# Patient Record
Sex: Female | Born: 1950 | Race: White | Hispanic: No | Smoking: Former smoker
Health system: Southern US, Community
[De-identification: ages and names within clinical notes are randomized; demographics above are authoritative.]

## PROBLEM LIST (undated history)

## (undated) DIAGNOSIS — I10 Essential (primary) hypertension: Secondary | ICD-10-CM

## (undated) DIAGNOSIS — E119 Type 2 diabetes mellitus without complications: Secondary | ICD-10-CM

## (undated) DIAGNOSIS — F32A Depression, unspecified: Secondary | ICD-10-CM

## (undated) DIAGNOSIS — R296 Repeated falls: Secondary | ICD-10-CM

## (undated) DIAGNOSIS — F329 Major depressive disorder, single episode, unspecified: Secondary | ICD-10-CM

## (undated) HISTORY — PX: ABDOMINAL HYSTERECTOMY: SHX81

## (undated) HISTORY — DX: Repeated falls: R29.6

---

## 2001-05-10 ENCOUNTER — Other Ambulatory Visit: Admission: RE | Admit: 2001-05-10 | Discharge: 2001-05-10 | Payer: Self-pay | Admitting: Gynecology

## 2001-05-11 ENCOUNTER — Ambulatory Visit (HOSPITAL_COMMUNITY): Admission: RE | Admit: 2001-05-11 | Discharge: 2001-05-11 | Payer: Self-pay | Admitting: Gastroenterology

## 2001-05-11 ENCOUNTER — Encounter (INDEPENDENT_AMBULATORY_CARE_PROVIDER_SITE_OTHER): Payer: Self-pay

## 2004-01-24 ENCOUNTER — Emergency Department (HOSPITAL_COMMUNITY): Admission: EM | Admit: 2004-01-24 | Discharge: 2004-01-24 | Payer: Self-pay | Admitting: Emergency Medicine

## 2005-10-24 ENCOUNTER — Encounter: Admission: RE | Admit: 2005-10-24 | Discharge: 2005-10-24 | Payer: Self-pay | Admitting: Specialist

## 2006-05-12 ENCOUNTER — Ambulatory Visit: Admission: RE | Admit: 2006-05-12 | Discharge: 2006-05-12 | Payer: Self-pay | Admitting: Internal Medicine

## 2006-07-02 ENCOUNTER — Encounter: Admission: RE | Admit: 2006-07-02 | Discharge: 2006-07-02 | Payer: Self-pay | Admitting: Neurology

## 2007-12-12 ENCOUNTER — Emergency Department (HOSPITAL_COMMUNITY): Admission: EM | Admit: 2007-12-12 | Discharge: 2007-12-12 | Payer: Self-pay | Admitting: Emergency Medicine

## 2008-07-16 ENCOUNTER — Encounter: Admission: RE | Admit: 2008-07-16 | Discharge: 2008-10-14 | Payer: Self-pay | Admitting: Internal Medicine

## 2008-10-21 ENCOUNTER — Encounter: Admission: RE | Admit: 2008-10-21 | Discharge: 2008-10-21 | Payer: Self-pay | Admitting: Obstetrics and Gynecology

## 2009-06-23 ENCOUNTER — Emergency Department (HOSPITAL_COMMUNITY): Admission: EM | Admit: 2009-06-23 | Discharge: 2009-06-23 | Payer: Self-pay | Admitting: Emergency Medicine

## 2009-10-08 ENCOUNTER — Encounter
Admission: RE | Admit: 2009-10-08 | Discharge: 2009-10-08 | Payer: Self-pay | Source: Home / Self Care | Admitting: Obstetrics and Gynecology

## 2010-06-21 ENCOUNTER — Emergency Department (HOSPITAL_COMMUNITY): Payer: 59

## 2010-06-21 ENCOUNTER — Emergency Department (HOSPITAL_COMMUNITY)
Admission: EM | Admit: 2010-06-21 | Discharge: 2010-06-22 | Disposition: A | Discharge status: Home / Self Care | Payer: 59 | Attending: Emergency Medicine | Admitting: Emergency Medicine

## 2010-06-21 DIAGNOSIS — R51 Headache: Secondary | ICD-10-CM | POA: Insufficient documentation

## 2010-06-21 DIAGNOSIS — S0083XA Contusion of other part of head, initial encounter: Secondary | ICD-10-CM | POA: Insufficient documentation

## 2010-06-21 DIAGNOSIS — M533 Sacrococcygeal disorders, not elsewhere classified: Secondary | ICD-10-CM | POA: Insufficient documentation

## 2010-06-21 DIAGNOSIS — S0990XA Unspecified injury of head, initial encounter: Secondary | ICD-10-CM | POA: Insufficient documentation

## 2010-06-21 DIAGNOSIS — S0003XA Contusion of scalp, initial encounter: Secondary | ICD-10-CM | POA: Insufficient documentation

## 2010-06-21 DIAGNOSIS — I10 Essential (primary) hypertension: Secondary | ICD-10-CM | POA: Insufficient documentation

## 2010-06-21 DIAGNOSIS — Y92009 Unspecified place in unspecified non-institutional (private) residence as the place of occurrence of the external cause: Secondary | ICD-10-CM | POA: Insufficient documentation

## 2010-06-21 DIAGNOSIS — W1809XA Striking against other object with subsequent fall, initial encounter: Secondary | ICD-10-CM | POA: Insufficient documentation
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
## 2010-09-29 ENCOUNTER — Other Ambulatory Visit: Payer: Self-pay | Admitting: Obstetrics and Gynecology

## 2010-09-29 DIAGNOSIS — Z1231 Encounter for screening mammogram for malignant neoplasm of breast: Secondary | ICD-10-CM

## 2010-10-26 ENCOUNTER — Ambulatory Visit: Payer: 59

## 2010-11-10 ENCOUNTER — Ambulatory Visit: Payer: 59

## 2010-12-20 LAB — DIFFERENTIAL
Basophils Absolute: 0
Basophils Relative: 1
Eosinophils Absolute: 0.2
Eosinophils Relative: 4
Lymphocytes Relative: 31
Lymphs Abs: 1.7
Monocytes Absolute: 0.5
Monocytes Relative: 8
Neutro Abs: 3.2
Neutrophils Relative %: 56

## 2010-12-20 LAB — COMPREHENSIVE METABOLIC PANEL
ALT: 23
AST: 22
Albumin: 3.9
Alkaline Phosphatase: 52
BUN: 8
CO2: 26
Calcium: 9.5
Chloride: 105
Creatinine, Ser: 0.54
GFR calc Af Amer: >60
GFR calc non Af Amer: >60
Glucose, Bld: 107 — ABNORMAL HIGH
Potassium: 3.9
Sodium: 138
Total Bilirubin: 0.7
Total Protein: 6.6

## 2010-12-20 LAB — POCT CARDIAC MARKERS
CKMB, poc: <1 — ABNORMAL LOW
Myoglobin, poc: 36
Troponin i, poc: <0.05

## 2010-12-20 LAB — CBC
HCT: 40.1
Hemoglobin: 13.8
MCHC: 34.4
MCV: 100.3 — ABNORMAL HIGH
Platelets: 239
RBC: 4
RDW: 13.6
WBC: 5.6

## 2010-12-20 LAB — D-DIMER, QUANTITATIVE: D-Dimer, Quant: <0.22

## 2011-01-04 ENCOUNTER — Ambulatory Visit
Admission: RE | Admit: 2011-01-04 | Discharge: 2011-01-04 | Disposition: A | Discharge status: Home / Self Care | Payer: 59 | Source: Ambulatory Visit | Attending: Obstetrics and Gynecology | Admitting: Obstetrics and Gynecology

## 2011-01-04 DIAGNOSIS — Z1231 Encounter for screening mammogram for malignant neoplasm of breast: Secondary | ICD-10-CM

## 2011-01-06 ENCOUNTER — Other Ambulatory Visit: Payer: Self-pay | Admitting: Obstetrics and Gynecology

## 2011-01-06 DIAGNOSIS — R928 Other abnormal and inconclusive findings on diagnostic imaging of breast: Secondary | ICD-10-CM

## 2011-01-18 ENCOUNTER — Ambulatory Visit
Admission: RE | Admit: 2011-01-18 | Discharge: 2011-01-18 | Disposition: A | Discharge status: Home / Self Care | Payer: 59 | Source: Ambulatory Visit | Attending: Obstetrics and Gynecology | Admitting: Obstetrics and Gynecology

## 2011-01-18 DIAGNOSIS — R928 Other abnormal and inconclusive findings on diagnostic imaging of breast: Secondary | ICD-10-CM

## 2011-01-20 ENCOUNTER — Other Ambulatory Visit: Payer: 59

## 2011-11-11 ENCOUNTER — Other Ambulatory Visit: Payer: Self-pay | Admitting: Obstetrics and Gynecology

## 2011-11-11 DIAGNOSIS — Z1231 Encounter for screening mammogram for malignant neoplasm of breast: Secondary | ICD-10-CM

## 2012-01-06 ENCOUNTER — Ambulatory Visit
Admission: RE | Admit: 2012-01-06 | Discharge: 2012-01-06 | Disposition: A | Discharge status: Home / Self Care | Payer: Self-pay | Source: Ambulatory Visit | Attending: Obstetrics and Gynecology | Admitting: Obstetrics and Gynecology

## 2012-01-06 DIAGNOSIS — Z1231 Encounter for screening mammogram for malignant neoplasm of breast: Secondary | ICD-10-CM

## 2012-12-19 ENCOUNTER — Other Ambulatory Visit: Payer: Self-pay

## 2012-12-19 DIAGNOSIS — Z1231 Encounter for screening mammogram for malignant neoplasm of breast: Secondary | ICD-10-CM

## 2013-01-09 ENCOUNTER — Ambulatory Visit
Admission: RE | Admit: 2013-01-09 | Discharge: 2013-01-09 | Disposition: A | Discharge status: Home / Self Care | Payer: Self-pay | Source: Ambulatory Visit

## 2013-01-09 DIAGNOSIS — Z1231 Encounter for screening mammogram for malignant neoplasm of breast: Secondary | ICD-10-CM

## 2013-01-14 ENCOUNTER — Other Ambulatory Visit: Payer: Self-pay | Admitting: Obstetrics and Gynecology

## 2013-01-14 DIAGNOSIS — R928 Other abnormal and inconclusive findings on diagnostic imaging of breast: Secondary | ICD-10-CM

## 2013-01-22 ENCOUNTER — Ambulatory Visit
Admission: RE | Admit: 2013-01-22 | Discharge: 2013-01-22 | Disposition: A | Discharge status: Home / Self Care | Payer: Managed Care, Other (non HMO) | Source: Ambulatory Visit | Attending: Obstetrics and Gynecology | Admitting: Obstetrics and Gynecology

## 2013-01-22 DIAGNOSIS — R928 Other abnormal and inconclusive findings on diagnostic imaging of breast: Secondary | ICD-10-CM

## 2013-01-31 ENCOUNTER — Other Ambulatory Visit: Payer: Managed Care, Other (non HMO)

## 2013-03-27 ENCOUNTER — Ambulatory Visit: Payer: Managed Care, Other (non HMO) | Admitting: Sports Medicine

## 2013-04-18 ENCOUNTER — Encounter: Payer: Self-pay | Admitting: Sports Medicine

## 2013-04-18 ENCOUNTER — Ambulatory Visit (INDEPENDENT_AMBULATORY_CARE_PROVIDER_SITE_OTHER): Payer: Managed Care, Other (non HMO) | Admitting: Sports Medicine

## 2013-04-18 ENCOUNTER — Encounter: Payer: Self-pay | Admitting: Neurology

## 2013-04-18 VITALS — BP 133/84 | Ht 64.5 in | Wt 186.0 lb

## 2013-04-18 DIAGNOSIS — M7540 Impingement syndrome of unspecified shoulder: Secondary | ICD-10-CM

## 2013-04-18 DIAGNOSIS — M25519 Pain in unspecified shoulder: Secondary | ICD-10-CM

## 2013-04-18 DIAGNOSIS — M67919 Unspecified disorder of synovium and tendon, unspecified shoulder: Secondary | ICD-10-CM

## 2013-04-18 DIAGNOSIS — M719 Bursopathy, unspecified: Secondary | ICD-10-CM

## 2013-04-18 MED ORDER — METHYLPREDNISOLONE ACETATE 40 MG/ML IJ SUSP
40.0000 mg | Freq: Once | INTRAMUSCULAR | Status: AC
  Administered 2013-04-18: 40 mg via INTRA_ARTICULAR
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
## 2013-05-16 ENCOUNTER — Ambulatory Visit: Payer: Managed Care, Other (non HMO) | Admitting: Sports Medicine

## 2013-05-20 ENCOUNTER — Encounter: Payer: Self-pay | Admitting: Sports Medicine

## 2013-05-20 ENCOUNTER — Ambulatory Visit (INDEPENDENT_AMBULATORY_CARE_PROVIDER_SITE_OTHER): Payer: Managed Care, Other (non HMO) | Admitting: Sports Medicine

## 2013-05-20 VITALS — BP 124/84 | Ht 64.5 in | Wt 186.0 lb

## 2013-05-20 DIAGNOSIS — S39012A Strain of muscle, fascia and tendon of lower back, initial encounter: Secondary | ICD-10-CM

## 2013-05-20 DIAGNOSIS — S335XXA Sprain of ligaments of lumbar spine, initial encounter: Secondary | ICD-10-CM

## 2013-05-20 MED ORDER — KETOROLAC TROMETHAMINE 60 MG/2ML IM SOLN
60.0000 mg | Freq: Once | INTRAMUSCULAR | Status: AC
  Administered 2013-05-20: 60 mg via INTRAMUSCULAR

## 2013-05-20 MED ORDER — METHYLPREDNISOLONE ACETATE 40 MG/ML IJ SUSP
40.0000 mg | Freq: Once | INTRAMUSCULAR | Status: AC
  Administered 2013-05-20: 40 mg via INTRAMUSCULAR

## 2013-05-20 MED ORDER — METHOCARBAMOL 500 MG PO TABS: 500.0000 mg | ORAL_TABLET | Freq: Two times a day (BID) | ORAL | Status: DC | PRN

## 2013-05-21 NOTE — Progress Notes (Addendum)
   Subjective:    Patient ID: Candice Velasquez, female    DOB: 04-13-1950, 63 y.o.   MRN: [id number]  HPI Patient comes in today for followup on bilateral shoulder pain. Pain is improved after subacromial cortisone injections and physical therapy. Main complaint today is pain and spasm across her low-back. No radiculopathy. Symptoms are worse with activity. No recent trauma. She is asking about the possibility of a muscle relaxer. No prior low back surgery.   Review of Systems     Objective:   Physical Exam Well-developed, well-nourished. No acute distress. Sitting comfortable in exam room  Lumbar spine: Limited flexion and extension secondary to pain. There is some mild spasm of the paraspinal musculature. Negative straight leg raise bilaterally no focal neurological deficits of either lower extremity.       Assessment & Plan:  Improved bilateral shoulder pain Low back pain secondary to lumbar strain  IM injections of both Depo-Medrol and Toradol were administered today. 40 mg of Depo-Medrol, 60 mg of Toradol. I've given her a prescription for methocarbamol 500 mg to take twice a day as needed for spasm. She is instructed not to work or drive on this medication. I've asked her to purchase an off-the-shelf lumbar support to wear at work (she works as a Conservation officer, naturecashier). Continue physical therapy and followup in 6 weeks.

## 2013-06-03 ENCOUNTER — Ambulatory Visit (INDEPENDENT_AMBULATORY_CARE_PROVIDER_SITE_OTHER): Payer: Managed Care, Other (non HMO) | Admitting: Sports Medicine

## 2013-06-03 ENCOUNTER — Encounter: Payer: Self-pay | Admitting: Sports Medicine

## 2013-06-03 VITALS — BP 137/81 | Ht 65.0 in | Wt 176.0 lb

## 2013-06-03 DIAGNOSIS — S39012A Strain of muscle, fascia and tendon of lower back, initial encounter: Secondary | ICD-10-CM

## 2013-06-03 DIAGNOSIS — S335XXA Sprain of ligaments of lumbar spine, initial encounter: Secondary | ICD-10-CM

## 2013-06-03 MED ORDER — METHYLPREDNISOLONE ACETATE 40 MG/ML IJ SUSP
40.0000 mg | Freq: Once | INTRAMUSCULAR | Status: AC
  Administered 2013-06-03: 40 mg via INTRA_ARTICULAR

## 2013-06-03 MED ORDER — KETOROLAC TROMETHAMINE 60 MG/2ML IM SOLN
60.0000 mg | Freq: Once | INTRAMUSCULAR | Status: AC
  Administered 2013-06-03: 60 mg via INTRAMUSCULAR

## 2013-06-04 NOTE — Progress Notes (Addendum)
Patient ID: Candice Velasquez, female   DOB: 1950/08/10, 63 y.o.   MRN: [id number]  Patient comes in today earlier than expected. She has returning low back pain. She was last seen in the office on March 2 and given IM injections of both Depo-Medrol and Toradol. Both were helpful but her pain has returned. It is identical to what she was experiencing at that time. No change in bowel or bladder habits. Intermittent radiating pain into each leg down to the knee but not past the knee. She has x-rays of both her cervical spine and her lumbar spine which were done at a local chiropractor's office. She is requesting repeat injections today.  Physical exam: Limited lumbar range of motion secondary to pain. Mild spasm of the paraspinal musculature bilaterally. No focal neurological deficits of either lower extremity.  X-rays of her cervical spine and lumbar spine from an outside source are reviewed. Films are dated 05/09/2013 and include AP and lateral views of both the cervical spine and lumbar spine. There are mild to moderate degenerative changes at C5-C6 and C6-C7. There is also a mild grade 1 spondylolisthesis at L4-L5.  A/P:  1. Returning low back pain likely secondary to lumbar strain versus grade 1 L4-L5 spondylolisthesis  --Patient was injected with another 40 mg of Depo-Medrol and another 60 mg of Toradol. She understands that we cannot continue to inject her with these medicines on a regular basis. She will keep her scheduled followup appointment and if symptoms persist or worsen I will need to consider further diagnostic imaging.

## 2013-07-01 ENCOUNTER — Encounter: Payer: Self-pay | Admitting: Sports Medicine

## 2013-07-01 ENCOUNTER — Ambulatory Visit (INDEPENDENT_AMBULATORY_CARE_PROVIDER_SITE_OTHER): Payer: Managed Care, Other (non HMO) | Admitting: Sports Medicine

## 2013-07-01 VITALS — BP 109/78 | Ht 65.0 in | Wt 165.0 lb

## 2013-07-01 DIAGNOSIS — M4316 Spondylolisthesis, lumbar region: Secondary | ICD-10-CM

## 2013-07-01 DIAGNOSIS — Q762 Congenital spondylolisthesis: Secondary | ICD-10-CM

## 2013-07-01 NOTE — Progress Notes (Signed)
   Subjective:    Patient ID: Candice Velasquez, female    DOB: 1950-05-13, 63 y.o.   MRN: [id number]  HPI Patient comes in today for followup on chronic low back pain. She has a history of a grade 1 L4-L5 spondylolisthesis. Today she is feeling pretty good. She has to take an occasional hydrocodone which is provided to her by Dr.Pharr. She feels like that medicine helped tremendously with her pain. She is also had physical therapy in the past and has a good understanding of her home exercises. Her pain is most noticeable with prolonged sitting. Pain improves with standing and walking. She denies any current radiculopathy in either leg.    Review of Systems     Objective:   Physical Exam Well-developed, well-nourished. No acute distress. Awake alert and oriented x3. Vital signs reviewed  Lumbar spine: Full lumbar mobility. Mild pain with extension but not marked. No gross focal neurological deficits of either lower extremity       Assessment & Plan:  Low back pain secondary to grade 1 L4-L5 spondylolisthesis and concomitant degenerative disc disease and facet arthropathy  Given her overall improvement in symptoms I'm going to hold off on further workup or treatment. I've encouraged her to stay active and continue with her home exercises. Her last MRI scan of her lumbar spine was in 2007. I explained to the patient that if her symptoms become more frequent or more debilitating then I would consider updating her MRI in anticipation of ordering some lumbar ESI's or facet injections. She will let me know if symptoms become severe enough to warrant this. Otherwise, followup PRN.

## 2013-11-11 ENCOUNTER — Other Ambulatory Visit: Payer: Self-pay | Admitting: Obstetrics and Gynecology

## 2013-11-12 LAB — CYTOLOGY - PAP

## 2013-12-04 ENCOUNTER — Other Ambulatory Visit: Payer: Self-pay | Admitting: Obstetrics and Gynecology

## 2014-01-08 ENCOUNTER — Other Ambulatory Visit: Payer: Self-pay

## 2014-01-08 DIAGNOSIS — Z1231 Encounter for screening mammogram for malignant neoplasm of breast: Secondary | ICD-10-CM

## 2014-01-28 ENCOUNTER — Encounter (INDEPENDENT_AMBULATORY_CARE_PROVIDER_SITE_OTHER): Payer: Self-pay

## 2014-01-28 ENCOUNTER — Ambulatory Visit
Admission: RE | Admit: 2014-01-28 | Discharge: 2014-01-28 | Disposition: A | Discharge status: Home / Self Care | Payer: Managed Care, Other (non HMO) | Source: Ambulatory Visit

## 2014-01-28 DIAGNOSIS — Z1231 Encounter for screening mammogram for malignant neoplasm of breast: Secondary | ICD-10-CM

## 2014-11-17 ENCOUNTER — Other Ambulatory Visit: Payer: Self-pay | Admitting: Obstetrics and Gynecology

## 2014-11-18 LAB — CYTOLOGY - PAP

## 2015-01-12 ENCOUNTER — Other Ambulatory Visit: Payer: Self-pay

## 2015-01-12 DIAGNOSIS — Z1231 Encounter for screening mammogram for malignant neoplasm of breast: Secondary | ICD-10-CM

## 2015-02-09 ENCOUNTER — Ambulatory Visit
Admission: RE | Admit: 2015-02-09 | Discharge: 2015-02-09 | Disposition: A | Discharge status: Home / Self Care | Payer: Managed Care, Other (non HMO) | Source: Ambulatory Visit

## 2015-02-09 DIAGNOSIS — Z1231 Encounter for screening mammogram for malignant neoplasm of breast: Secondary | ICD-10-CM

## 2015-05-04 ENCOUNTER — Encounter: Payer: Managed Care, Other (non HMO) | Admitting: Acute Care

## 2015-06-02 ENCOUNTER — Ambulatory Visit (INDEPENDENT_AMBULATORY_CARE_PROVIDER_SITE_OTHER): Payer: Managed Care, Other (non HMO) | Admitting: Acute Care

## 2015-06-02 ENCOUNTER — Encounter: Payer: Self-pay | Admitting: Acute Care

## 2015-06-02 ENCOUNTER — Other Ambulatory Visit: Payer: Self-pay | Admitting: Acute Care

## 2015-06-02 DIAGNOSIS — Z87891 Personal history of nicotine dependence: Secondary | ICD-10-CM

## 2015-06-02 NOTE — Progress Notes (Addendum)
Shared Decision Making Visit Lung Cancer Screening Program [phone]([id number])   Eligibility:  Age 65 y.o.  Pack Years Smoking History Calculation 31 pack year history (# packs/per year x # years smoked)  Recent History of coughing up blood  no  Unexplained weight loss? no ( >Than 15 pounds within the last 6 months )  Prior History Lung / other cancer no (Diagnosis within the last 5 years already requiring surveillance chest CT Scans).  Smoking Status Former Smoker  Former Smokers: Years since quit: < 1 year  Quit Date: 02/18/15  Visit Components:  Discussion included one or more decision making aids. yes  Discussion included risk/benefits of screening. yes  Discussion included potential follow up diagnostic testing for abnormal scans. yes  Discussion included meaning and risk of over diagnosis. yes  Discussion included meaning and risk of False Positives. yes  Discussion included meaning of total radiation exposure. yes  Counseling Included:  Importance of adherence to annual lung cancer LDCT screening. yes  Impact of comorbidities on ability to participate in the program. yes  Ability and willingness to under diagnostic treatment. yes  Smoking Cessation Counseling:  Current Smokers:   Discussed importance of smoking cessation. NA; former smoker  Information about tobacco cessation classes and interventions provided to patient.NA; former smoker  Patient provided with "ticket" for LDCT Scan. yes  Symptomatic Patient. no  Counseling  Diagnosis Code: Tobacco Use Z72.0  Asymptomatic Patient yes  Counseling  Former Smokers:   Discussed the importance of maintaining cigarette abstinence. yes  Diagnosis Code: Personal History of Nicotine Dependence. B14.782Z87.891  Information about tobacco cessation classes and interventions provided to patient. Yes  Patient provided with "ticket" for LDCT Scan. yes  Written Order for Lung Cancer Screening with LDCT placed in Epic.  Yes (CT Chest Lung Cancer Screening Low Dose W/O CM) [id number] Z12.2-Screening of respiratory organs Z87.891-Personal history of nicotine dependence  I spent 15 minutes of face to face time with Candice Velasquez discussing the risks and benefits of lung cancer screening. We viewed a power point together that explained in detail the above noted topics. We took the time to pause the power point at intervals to allow for questions to be asked and answered to ensure understanding. We discussed that she had taken the single most powerful action possible to decrease her risk of developing lung cancer when she quit smoking. I counseled Candice Velasquez to remain smoke free, and to contact me if she ever had the desire to smoke again so that I can provide resources and tools to help support the effort to remain smoke free. We discussed the time and location of the scan, and that either Jerolyn Shinammy Wilson, CMA or I will call with the results within  24-48 hours of receiving them. She has my card and contact information in the event that she  needs to speak with me, in addition to a copy of the power point we reviewed as a resource. Candice Velasquez verbalized understanding of all of the above and had no further questions upon leaving the office.    Bevelyn NgoSarah F Jhair Witherington, NP  06/02/15

## 2015-06-09 ENCOUNTER — Ambulatory Visit
Admission: RE | Admit: 2015-06-09 | Discharge: 2015-06-09 | Disposition: A | Discharge status: Home / Self Care | Payer: Managed Care, Other (non HMO) | Source: Ambulatory Visit | Attending: Acute Care | Admitting: Acute Care

## 2015-06-09 DIAGNOSIS — Z87891 Personal history of nicotine dependence: Secondary | ICD-10-CM

## 2015-09-03 ENCOUNTER — Ambulatory Visit: Payer: Managed Care, Other (non HMO)

## 2015-09-08 ENCOUNTER — Ambulatory Visit: Payer: Managed Care, Other (non HMO)

## 2015-09-10 ENCOUNTER — Ambulatory Visit: Payer: Managed Care, Other (non HMO)

## 2015-09-15 ENCOUNTER — Ambulatory Visit: Payer: Managed Care, Other (non HMO)

## 2015-10-01 ENCOUNTER — Ambulatory Visit: Payer: Managed Care, Other (non HMO)

## 2016-01-26 ENCOUNTER — Telehealth: Payer: Self-pay | Admitting: Acute Care

## 2016-01-26 DIAGNOSIS — Z87891 Personal history of nicotine dependence: Secondary | ICD-10-CM

## 2016-01-26 NOTE — Telephone Encounter (Signed)
This is documentation of a call made 06/22/2015 to Mrs. Candice Velasquez with the results of her low-dose screening CT. I explained her scan was read as a Lung RADS 2: nodules that are benign in appearance and behavior with a very low likelihood of becoming a clinically active cancer due to size or lack of growth. Recommendation per radiology is for a repeat LDCT in 12 months. I told her we will order and schedule her annual scan for April 2018. I also explained that the scan indicated very mild emphysema. She verbalized understanding of the above results, and had no further questions at completion of the call. My contact information in the event she has questions in the future.

## 2016-02-09 ENCOUNTER — Other Ambulatory Visit: Payer: Self-pay | Admitting: Internal Medicine

## 2016-02-09 DIAGNOSIS — N644 Mastodynia: Secondary | ICD-10-CM

## 2016-02-15 ENCOUNTER — Other Ambulatory Visit: Payer: Managed Care, Other (non HMO)

## 2016-02-17 ENCOUNTER — Ambulatory Visit
Admission: RE | Admit: 2016-02-17 | Discharge: 2016-02-17 | Disposition: A | Discharge status: Home / Self Care | Payer: Managed Care, Other (non HMO) | Source: Ambulatory Visit | Attending: Internal Medicine | Admitting: Internal Medicine

## 2016-02-17 ENCOUNTER — Other Ambulatory Visit: Payer: Self-pay | Admitting: Internal Medicine

## 2016-02-17 DIAGNOSIS — N644 Mastodynia: Secondary | ICD-10-CM

## 2016-04-09 ENCOUNTER — Ambulatory Visit (HOSPITAL_COMMUNITY)
Admission: EM | Admit: 2016-04-09 | Discharge: 2016-04-09 | Disposition: A | Discharge status: Home / Self Care | Payer: Managed Care, Other (non HMO) | Attending: Family Medicine | Admitting: Family Medicine

## 2016-04-09 ENCOUNTER — Encounter (HOSPITAL_COMMUNITY): Payer: Self-pay | Admitting: Emergency Medicine

## 2016-04-09 DIAGNOSIS — J40 Bronchitis, not specified as acute or chronic: Secondary | ICD-10-CM

## 2016-04-09 HISTORY — DX: Type 2 diabetes mellitus without complications: E11.9

## 2016-04-09 HISTORY — DX: Essential (primary) hypertension: I10

## 2016-04-09 HISTORY — DX: Major depressive disorder, single episode, unspecified: F32.9

## 2016-04-09 HISTORY — DX: Depression, unspecified: F32.A

## 2016-04-09 MED ORDER — AZITHROMYCIN 250 MG PO TABS: 250.0000 mg | ORAL_TABLET | Freq: Every day | ORAL | 0 refills | Status: DC

## 2016-04-09 MED ORDER — HYDROCODONE-HOMATROPINE 5-1.5 MG/5ML PO SYRP
5.0000 mL | ORAL_SOLUTION | Freq: Four times a day (QID) | ORAL | 0 refills | Status: DC | PRN
Start: 2016-04-09 — End: 2017-02-07

## 2016-04-09 NOTE — ED Provider Notes (Signed)
[facility]    CSN: [id number] Arrival date & time: 04/09/16  1411     History   Chief Complaint Chief Complaint  Patient presents with  . Cough    HPI Candice Velasquez is a 65 y.o. female.   This is a 66 year old woman who presents withLeft neck soreness, with blowing nose, ears are full, cough and congestion, and intermittent fever for the past 7-10 days.  Gently she's been very healthy.  She's watching her 76-month-old grand son. He's been healthy.      Past Medical History:  Diagnosis Date  . Depression   . Diabetes mellitus without complication (HCC)   . Hypertension     There are no active problems to display for this patient.   History reviewed. No pertinent surgical history.  OB History    No data available       Home Medications    Prior to Admission medications   Medication Sig Start Date End Date Taking? Authorizing Provider  aspirin EC 81 MG tablet Take 81 mg by mouth daily.   Yes Historical Provider, MD  DULoxetine (CYMBALTA) 60 MG capsule Take 60 mg by mouth daily.   Yes Historical Provider, MD  ketoconazole (NIZORAL) 2 % cream Apply 1 application topically daily.   Yes Historical Provider, MD  naloxegol oxalate (MOVANTIK) 25 MG TABS tablet Take by mouth daily.   Yes Historical Provider, MD  naloxegol oxalate (MOVANTIK) 25 MG TABS tablet Take by mouth daily.   Yes Historical Provider, MD  triamcinolone cream (KENALOG) 0.1 % Apply 1 application topically 2 (two) times daily.   Yes Historical Provider, MD  zolpidem (AMBIEN) 5 MG tablet Take 5 mg by mouth at bedtime as needed for sleep.   Yes Historical Provider, MD  ABILIFY 2 MG tablet  03/12/13   Historical Provider, MD  azithromycin (ZITHROMAX) 250 MG tablet Take 1 tablet (250 mg total) by mouth daily. Take first 2 tablets together, then 1 every day until finished. 04/09/16   Elvina Sidle, MD  ENABLEX 7.5 MG 24 hr tablet  06/18/13   Historical Provider, MD  fluconazole (DIFLUCAN)  100 MG tablet  03/18/13   Historical Provider, MD  HYDROcodone-acetaminophen (NORCO/VICODIN) 5-325 MG per tablet  05/31/13   Historical Provider, MD  HYDROcodone-homatropine (HYDROMET) 5-1.5 MG/5ML syrup Take 5 mLs by mouth every 6 (six) hours as needed for cough. 04/09/16   Elvina Sidle, MD  LORazepam (ATIVAN) 1 MG tablet  02/11/13   Historical Provider, MD  methocarbamol (ROBAXIN) 500 MG tablet Take 1 tablet (500 mg total) by mouth 2 (two) times daily as needed for muscle spasms. Patient not taking: Reported on 04/09/2016 05/20/13   Ralene Cork, DO  methylPREDNIsolone (MEDROL DOSPACK) 4 MG tablet  02/05/13   Historical Provider, MD  MYRBETRIQ 25 MG TB24 tablet  03/18/13   Historical Provider, MD  traMADol Janean Sark) 50 MG tablet  01/15/13   Historical Provider, MD  triamterene-hydrochlorothiazide (MAXZIDE-25) 37.5-25 MG per tablet  03/15/13   Historical Provider, MD  VESICARE 10 MG tablet  02/16/13   Historical Provider, MD  VIIBRYD 40 MG TABS  04/17/13   Historical Provider, MD  VYVANSE 30 MG capsule  03/18/13   Historical Provider, MD    Family History No family history on file.  Social History Social History  Substance Use Topics  . Smoking status: Current Every Day Smoker    Packs/day: 1.00    Years: 31.00    Types: Cigarettes  Last attempt to quit: 02/18/2015  . Smokeless tobacco: Never Used     Comment: Counseled to remain smoke free  . Alcohol use 0.0 oz/week     Allergies   Patient has no known allergies.   Review of Systems Review of Systems  Constitutional: Positive for fever.  HENT: Negative.   Respiratory: Positive for cough.   Cardiovascular: Negative.   Gastrointestinal: Negative.      Physical Exam Triage Vital Signs ED Triage Vitals [04/09/16 1623]  Enc Vitals Group     BP      Pulse      Resp      Temp      Temp src      SpO2      Weight      Height      Head Circumference      Peak Flow      Pain Score 2     Pain Loc      Pain Edu?        Excl. in GC?    No data found.   Updated Vital Signs BP 154/87 (BP Location: Right Arm)   Pulse 85   Temp 98.9 F (37.2 C) (Oral)   Resp 18   SpO2 98%    Physical Exam  Constitutional: She is oriented to person, place, and time. She appears well-developed and well-nourished.  HENT:  Right Ear: External ear normal.  Left Ear: External ear normal.  Mouth/Throat: Oropharynx is clear and moist.  Eyes: Conjunctivae and EOM are normal.  Neck: Normal range of motion. Neck supple.  Cardiovascular: Normal rate, regular rhythm and normal heart sounds.   Pulmonary/Chest: Effort normal. She has rales.  Few rales at the left base  Musculoskeletal: Normal range of motion.  Neurological: She is alert and oriented to person, place, and time.  Skin: Skin is warm and dry.  Nursing note and vitals reviewed.    UC Treatments / Results  Labs (all labs ordered are listed, but only abnormal results are displayed) Labs Reviewed - No data to display  EKG  EKG Interpretation None       Radiology No results found.  Procedures Procedures (including critical care time)  Medications Ordered in UC Medications - No data to display   Initial Impression / Assessment and Plan / UC Course  I have reviewed the triage vital signs and the nursing notes.  Pertinent labs & imaging results that were available during my care of the patient were reviewed by me and considered in my medical decision making (see chart for details).     Final Clinical Impressions(s) / UC Diagnoses   Final diagnoses:  Bronchitis    New Prescriptions New Prescriptions   AZITHROMYCIN (ZITHROMAX) 250 MG TABLET    Take 1 tablet (250 mg total) by mouth daily. Take first 2 tablets together, then 1 every day until finished.   HYDROCODONE-HOMATROPINE (HYDROMET) 5-1.5 MG/5ML SYRUP    Take 5 mLs by mouth every 6 (six) hours as needed for cough.     Elvina SidleKurt Max Romano, MD 04/09/16 [phone]

## 2016-04-09 NOTE — ED Triage Notes (Signed)
Left neck soreness, with blowing nose, ears are full, cough and congestion, intermittent fever

## 2016-08-31 ENCOUNTER — Encounter: Payer: Self-pay | Admitting: Acute Care

## 2017-02-07 ENCOUNTER — Encounter: Payer: Self-pay | Admitting: Emergency Medicine

## 2017-02-07 ENCOUNTER — Other Ambulatory Visit: Payer: Self-pay

## 2017-02-07 ENCOUNTER — Ambulatory Visit: Payer: Managed Care, Other (non HMO) | Admitting: Emergency Medicine

## 2017-02-07 VITALS — BP 138/70 | HR 100 | Temp 99.2°F | Resp 16 | Ht 64.25 in | Wt 182.4 lb

## 2017-02-07 DIAGNOSIS — S0083XA Contusion of other part of head, initial encounter: Secondary | ICD-10-CM | POA: Diagnosis not present

## 2017-02-07 DIAGNOSIS — S0081XA Abrasion of other part of head, initial encounter: Secondary | ICD-10-CM | POA: Diagnosis not present

## 2017-02-07 DIAGNOSIS — W19XXXA Unspecified fall, initial encounter: Secondary | ICD-10-CM | POA: Diagnosis not present

## 2017-02-07 MED ORDER — MUPIROCIN CALCIUM 2 % EX CREA: 1.0000 "application " | TOPICAL_CREAM | Freq: Two times a day (BID) | CUTANEOUS | 0 refills | Status: DC

## 2017-02-07 NOTE — Progress Notes (Signed)
Candice Velasquez 66 y.o.   Chief Complaint  Patient presents with  . Fall    INJURY TO FACE and LEFT side of body x 3 days    HISTORY OF PRESENT ILLNESS: This is a 66 y.o. female fell 3 days ago in parking lot of [employer] as she was leaving work; may have tripped; denies LOC; injured left side of face and body; concerned about abrasion to left cheek area and possible infection.  HPI   Prior to Admission medications   Medication Sig Start Date End Date Taking? Authorizing Provider  aspirin EC 81 MG tablet Take 81 mg by mouth daily.   Yes [provider]  cyanocobalamin 100 MCG tablet Take 100 mcg by mouth daily.   Yes [provider]  DULoxetine (CYMBALTA) 60 MG capsule Take 60 mg by mouth daily.   Yes [provider]  ENABLEX 7.5 MG 24 hr tablet  06/18/13  Yes [provider]  GARLIC PO Take by mouth daily.   Yes [provider]  IBUPROFEN PO Take by mouth as needed.   Yes [provider]  LORazepam (ATIVAN) 1 MG tablet  02/11/13  Yes [provider]  meloxicam (MOBIC) 7.5 MG tablet Take 7.5 mg by mouth as needed for pain.   Yes [provider]  triamterene-hydrochlorothiazide (MAXZIDE-25) 37.5-25 MG per tablet  03/15/13  Yes [provider]  TURMERIC PO Take by mouth daily.   Yes [provider]  ABILIFY 2 MG tablet  03/12/13   [provider]  fluconazole (DIFLUCAN) 100 MG tablet  03/18/13   [provider]  HYDROcodone-acetaminophen (NORCO/VICODIN) 5-325 MG per tablet  05/31/13   [provider]  ketoconazole (NIZORAL) 2 % cream Apply 1 application topically daily.    [provider]  methylPREDNIsolone (MEDROL DOSPACK) 4 MG tablet  02/05/13   [provider]  MYRBETRIQ 25 MG TB24 tablet  03/18/13   [provider]  naloxegol oxalate (MOVANTIK) 25 MG TABS tablet Take by mouth daily.    [provider]  naloxegol oxalate  (MOVANTIK) 25 MG TABS tablet Take by mouth daily.    [provider]  traMADol Janean Sark(ULTRAM) 50 MG tablet  01/15/13   [provider]  triamcinolone cream (KENALOG) 0.1 % Apply 1 application topically 2 (two) times daily.    [provider]  VESICARE 10 MG tablet  02/16/13   [provider]  VIIBRYD 40 MG TABS  04/17/13   [provider]  VYVANSE 30 MG capsule  03/18/13   [provider]  zolpidem (AMBIEN) 5 MG tablet Take 5 mg by mouth at bedtime as needed for sleep.    [provider]    Not on File  There are no active problems to display for this patient.   Past Medical History:  Diagnosis Date  . Depression   . Diabetes mellitus without complication (HCC)   . Hypertension     No past surgical history on file.  Social History   Socioeconomic History  . Marital status: Married    Spouse name: Not on file  . Number of children: Not on file  . Years of education: Not on file  . Highest education level: Not on file  Social Needs  . Financial resource strain: Not on file  . Food insecurity - worry: Not on file  . Food insecurity - inability: Not on file  . Transportation needs - medical: Not on file  . Transportation  needs - non-medical: Not on file  Occupational History  . Not on file  Tobacco Use  . Smoking status: Current Every Day Smoker    Packs/day: 1.00    Years: 31.00    Pack years: 31.00    Types: Cigarettes    Last attempt to quit: 02/18/2015    Years since quitting: 1.9  . Smokeless tobacco: Never Used  . Tobacco comment: Counseled to remain smoke free  Substance and Sexual Activity  . Alcohol use: Yes    Alcohol/week: 0.0 oz  . Drug use: Not on file  . Sexual activity: Not on file  Other Topics Concern  . Not on file  Social History Narrative  . Not on file    No family history on file.   Review of Systems  Constitutional: Negative.  Negative for chills and fever.  HENT: Negative for  ear pain, nosebleeds and sore throat.   Eyes: Negative for blurred vision and double vision.  Respiratory: Negative.  Negative for cough and shortness of breath.   Cardiovascular: Negative.  Negative for chest pain and palpitations.  Gastrointestinal: Negative.  Negative for abdominal pain, diarrhea, nausea and vomiting.  Genitourinary: Negative for hematuria.  Musculoskeletal: Negative for neck pain.  Skin: Positive for rash.  Neurological: Negative.  Negative for dizziness, sensory change, focal weakness, seizures, loss of consciousness and headaches.  Endo/Heme/Allergies: Negative.   All other systems reviewed and are negative.  Vitals:   02/07/17 1622  BP: 138/70  Pulse: 100  Resp: 16  Temp: 99.2 F (37.3 C)  SpO2: 97%     Physical Exam  Constitutional: She is oriented to person, place, and time. She appears well-developed and well-nourished.  HENT:  Head: Normocephalic and atraumatic.    Mouth/Throat: Oropharynx is clear and moist.  Eyes: Conjunctivae and EOM are normal. Pupils are equal, round, and reactive to light.  Neck: Normal range of motion. Neck supple.  Cardiovascular: Normal rate, regular rhythm and normal heart sounds.  Pulmonary/Chest: Effort normal and breath sounds normal.  Musculoskeletal: Normal range of motion.  Extremities: WNL  Neurological: She is alert and oriented to person, place, and time. She displays normal reflexes. No cranial nerve deficit or sensory deficit. She exhibits normal muscle tone. Coordination normal.  Skin: Skin is warm and dry.  Face:(left side) +bruising, abrasion, and infraorbital ecchymosis; no signs of orbital floor Fx.  Vitals reviewed.    ASSESSMENT & PLAN:  Candice Velasquez was seen today for fall.  Diagnoses and all orders for this visit:  Contusion of face, initial encounter  Abrasion of face, initial encounter -     mupirocin cream (BACTROBAN) 2 %; Apply 1 application topically 2 (two) times daily.  Accidental fall,  initial encounter    Patient Instructions       IF you received an x-ray today, you will receive an invoice from [facility]. Please contact [facility] at [phone] with questions or concerns regarding your invoice.   IF you received labwork today, you will receive an invoice from [facility]. Please contact [facility] at [phone] with questions or concerns regarding your invoice.   Our billing staff will not be able to assist you with questions regarding bills from these companies.  You will be contacted with the lab results as soon as they are available. The fastest way to get your results is to activate your My Chart account. Instructions are located on the last page of this paperwork. If you have not heard from us regarding the results  in 2 weeks, please contact this office.     Abrasion An abrasion is a cut or scrape on the surface of your skin. An abrasion does not go through all of the layers of your skin. It is important to take good care of your abrasion to prevent infection. Follow these instructions at home: Medicines  Take or apply medicines only as told by your doctor.  If you were prescribed an antibiotic ointment, finish all of it even if you start to feel better. Wound care  Clean the wound with mild soap and water 2-3 times per day or as told by your doctor. Pat your wound dry with a clean towel. Do not rub it.  There are many ways to close and cover a wound. Follow instructions from your doctor about: ? How to take care of your wound. ? When and how you should change your bandage (dressing). ? When and how you should take off your dressing.  Check your wound every day for signs of infection. Watch for: ? Redness, swelling, or pain. ? Fluid, blood, or pus. General instructions  Keep the dressing dry as told by your doctor. Do not take baths, swim, use a hot tub, or do anything that would put your wound underwater until your doctor says  it is okay.  If there is swelling, raise (elevate) the injured area above the level of your heart while you are sitting or lying down.  Keep all follow-up visits as told by your doctor. This is important. Contact a doctor if:  You were given a tetanus shot and you have any of these where the needle went in: ? Swelling. ? Very bad pain. ? Redness. ? Bleeding.  Medicine does not help your pain.  You have any of these at the site of the wound: ? More redness. ? More swelling. ? More pain. Get help right away if:  You have a red streak going away from your wound.  You have a fever.  You have fluid, blood, or pus coming from your wound.  There is a bad smell coming from your wound. This information is not intended to replace advice given to you by your health care provider. Make sure you discuss any questions you have with your health care provider. Document Released: 08/24/2007 Document Revised: 08/13/2015 Document Reviewed: 03/05/2014 Elsevier Interactive Patient Education  2018 Elsevier Inc.  Facial or Scalp Contusion A facial or scalp contusion is a deep bruise (contusion) on the face or head. Bruises happen when an injury causes bleeding under the skin. The bruise may turn blue, purple, or yellow. Minor injuries will cause a bruise that is not painful, but worse bruises can stay painful and swollen for a few weeks. Follow these instructions at home:  Take over-the-counter and prescription medicines only as told by your doctor.  If directed, apply ice to the injured area. ? Put ice in a plastic bag. ? Place a towel between your skin and the bag. ? Leave the ice on for 20 minutes, 2-3 times a day.  Keep all follow-up visits as told by your doctor. This is important. Contact a doctor if:  You have trouble biting or chewing.  Your pain or swelling gets worse.  You have pain when you move your eyes. Get help right away if:  You have very bad pain or a headache, and  medicine does not help.  You are very tired or confused, or your personality changes.  You throw up (vomit).  You have a nosebleed that does not stop.  You see two of everything (double vision) or have blurry vision.  You have clear fluid coming from your nose or ear, and it does not go away.  You have problems walking or using your arms or legs.  You are very dizzy. Summary  A facial or scalp contusion is a deep bruise (contusion) on the face or head.  Bruises happen when an injury causes bleeding under the skin.  Minor injuries will cause a bruise that is not painful, but worse bruises can stay painful and swollen for a few weeks. This information is not intended to replace advice given to you by your health care provider. Make sure you discuss any questions you have with your health care provider. Document Released: 02/24/2011 Document Revised: 01/26/2016 Document Reviewed: 01/26/2016 Elsevier Interactive Patient Education  2017 Elsevier Inc.     Edwina Barth, MD [facility]

## 2017-02-07 NOTE — Patient Instructions (Addendum)
IF you received an x-ray today, you will receive an invoice from [facility]. Please contact [facility] at [phone] with questions or concerns regarding your invoice.   IF you received labwork today, you will receive an invoice from [facility]. Please contact [facility] at [phone] with questions or concerns regarding your invoice.   Our billing staff will not be able to assist you with questions regarding bills from these companies.  You will be contacted with the lab results as soon as they are available. The fastest way to get your results is to activate your My Chart account. Instructions are located on the last page of this paperwork. If you have not heard from us regarding the results in 2 weeks, please contact this office.     Abrasion An abrasion is a cut or scrape on the surface of your skin. An abrasion does not go through all of the layers of your skin. It is important to take good care of your abrasion to prevent infection. Follow these instructions at home: Medicines  Take or apply medicines only as told by your doctor.  If you were prescribed an antibiotic ointment, finish all of it even if you start to feel better. Wound care  Clean the wound with mild soap and water 2-3 times per day or as told by your doctor. Pat your wound dry with a clean towel. Do not rub it.  There are many ways to close and cover a wound. Follow instructions from your doctor about: ? How to take care of your wound. ? When and how you should change your bandage (dressing). ? When and how you should take off your dressing.  Check your wound every day for signs of infection. Watch for: ? Redness, swelling, or pain. ? Fluid, blood, or pus. General instructions  Keep the dressing dry as told by your doctor. Do not take baths, swim, use a hot tub, or do anything that would put your wound underwater until your doctor says it is okay.  If there is swelling, raise  (elevate) the injured area above the level of your heart while you are sitting or lying down.  Keep all follow-up visits as told by your doctor. This is important. Contact a doctor if:  You were given a tetanus shot and you have any of these where the needle went in: ? Swelling. ? Very bad pain. ? Redness. ? Bleeding.  Medicine does not help your pain.  You have any of these at the site of the wound: ? More redness. ? More swelling. ? More pain. Get help right away if:  You have a red streak going away from your wound.  You have a fever.  You have fluid, blood, or pus coming from your wound.  There is a bad smell coming from your wound. This information is not intended to replace advice given to you by your health care provider. Make sure you discuss any questions you have with your health care provider. Document Released: 08/24/2007 Document Revised: 08/13/2015 Document Reviewed: 03/05/2014 Elsevier Interactive Patient Education  2018 Elsevier Inc.  Facial or Scalp Contusion A facial or scalp contusion is a deep bruise (contusion) on the face or head. Bruises happen when an injury causes bleeding under the skin. The bruise may turn blue, purple, or yellow. Minor injuries will cause a bruise that is not painful, but worse bruises can stay painful and swollen for a few weeks. Follow these instructions at home:  Take  over-the-counter and prescription medicines only as told by your doctor.  If directed, apply ice to the injured area. ? Put ice in a plastic bag. ? Place a towel between your skin and the bag. ? Leave the ice on for 20 minutes, 2-3 times a day.  Keep all follow-up visits as told by your doctor. This is important. Contact a doctor if:  You have trouble biting or chewing.  Your pain or swelling gets worse.  You have pain when you move your eyes. Get help right away if:  You have very bad pain or a headache, and medicine does not help.  You are very tired  or confused, or your personality changes.  You throw up (vomit).  You have a nosebleed that does not stop.  You see two of everything (double vision) or have blurry vision.  You have clear fluid coming from your nose or ear, and it does not go away.  You have problems walking or using your arms or legs.  You are very dizzy. Summary  A facial or scalp contusion is a deep bruise (contusion) on the face or head.  Bruises happen when an injury causes bleeding under the skin.  Minor injuries will cause a bruise that is not painful, but worse bruises can stay painful and swollen for a few weeks. This information is not intended to replace advice given to you by your health care provider. Make sure you discuss any questions you have with your health care provider. Document Released: 02/24/2011 Document Revised: 01/26/2016 Document Reviewed: 01/26/2016 Elsevier Interactive Patient Education  2017 [employer]

## 2017-02-08 ENCOUNTER — Telehealth: Payer: Self-pay | Admitting: Emergency Medicine

## 2017-02-10 NOTE — Telephone Encounter (Signed)
Please advise 

## 2017-02-10 NOTE — Telephone Encounter (Signed)
It's OK to use ointment instead. Thanks, [place].

## 2017-02-10 NOTE — Telephone Encounter (Signed)
Tried to call pharmacy and got a message they are closed for lunch with try again in an hour.

## 2017-04-20 DIAGNOSIS — F331 Major depressive disorder, recurrent, moderate: Secondary | ICD-10-CM | POA: Diagnosis not present

## 2017-04-26 ENCOUNTER — Ambulatory Visit: Payer: Managed Care, Other (non HMO) | Admitting: Podiatry

## 2017-05-19 DIAGNOSIS — I10 Essential (primary) hypertension: Secondary | ICD-10-CM | POA: Diagnosis not present

## 2017-05-19 DIAGNOSIS — J329 Chronic sinusitis, unspecified: Secondary | ICD-10-CM | POA: Diagnosis not present

## 2017-05-19 DIAGNOSIS — F339 Major depressive disorder, recurrent, unspecified: Secondary | ICD-10-CM | POA: Diagnosis not present

## 2017-05-19 DIAGNOSIS — J309 Allergic rhinitis, unspecified: Secondary | ICD-10-CM | POA: Diagnosis not present

## 2017-05-26 DIAGNOSIS — R739 Hyperglycemia, unspecified: Secondary | ICD-10-CM | POA: Diagnosis not present

## 2017-06-19 DIAGNOSIS — R29898 Other symptoms and signs involving the musculoskeletal system: Secondary | ICD-10-CM | POA: Diagnosis not present

## 2017-06-19 DIAGNOSIS — R32 Unspecified urinary incontinence: Secondary | ICD-10-CM | POA: Diagnosis not present

## 2017-06-26 ENCOUNTER — Other Ambulatory Visit: Payer: Self-pay | Admitting: Internal Medicine

## 2017-06-26 DIAGNOSIS — F331 Major depressive disorder, recurrent, moderate: Secondary | ICD-10-CM | POA: Diagnosis not present

## 2017-06-26 DIAGNOSIS — Z1231 Encounter for screening mammogram for malignant neoplasm of breast: Secondary | ICD-10-CM

## 2017-06-30 DIAGNOSIS — R29898 Other symptoms and signs involving the musculoskeletal system: Secondary | ICD-10-CM | POA: Diagnosis not present

## 2017-06-30 DIAGNOSIS — R05 Cough: Secondary | ICD-10-CM | POA: Diagnosis not present

## 2017-07-12 DIAGNOSIS — F331 Major depressive disorder, recurrent, moderate: Secondary | ICD-10-CM | POA: Diagnosis not present

## 2017-07-14 DIAGNOSIS — E559 Vitamin D deficiency, unspecified: Secondary | ICD-10-CM | POA: Diagnosis not present

## 2017-07-14 DIAGNOSIS — E119 Type 2 diabetes mellitus without complications: Secondary | ICD-10-CM | POA: Diagnosis not present

## 2017-07-14 DIAGNOSIS — I10 Essential (primary) hypertension: Secondary | ICD-10-CM | POA: Diagnosis not present

## 2017-07-18 DIAGNOSIS — I7300 Raynaud's syndrome without gangrene: Secondary | ICD-10-CM | POA: Diagnosis not present

## 2017-07-18 DIAGNOSIS — R911 Solitary pulmonary nodule: Secondary | ICD-10-CM | POA: Diagnosis not present

## 2017-07-18 DIAGNOSIS — M545 Low back pain: Secondary | ICD-10-CM | POA: Diagnosis not present

## 2017-07-18 DIAGNOSIS — G4733 Obstructive sleep apnea (adult) (pediatric): Secondary | ICD-10-CM | POA: Diagnosis not present

## 2017-07-18 DIAGNOSIS — R350 Frequency of micturition: Secondary | ICD-10-CM | POA: Diagnosis not present

## 2017-07-18 DIAGNOSIS — J432 Centrilobular emphysema: Secondary | ICD-10-CM | POA: Diagnosis not present

## 2017-07-18 DIAGNOSIS — I10 Essential (primary) hypertension: Secondary | ICD-10-CM | POA: Diagnosis not present

## 2017-07-18 DIAGNOSIS — E119 Type 2 diabetes mellitus without complications: Secondary | ICD-10-CM | POA: Diagnosis not present

## 2017-07-18 DIAGNOSIS — Z0000 Encounter for general adult medical examination without abnormal findings: Secondary | ICD-10-CM | POA: Diagnosis not present

## 2017-07-18 DIAGNOSIS — F339 Major depressive disorder, recurrent, unspecified: Secondary | ICD-10-CM | POA: Diagnosis not present

## 2017-07-18 DIAGNOSIS — Z6831 Body mass index (BMI) 31.0-31.9, adult: Secondary | ICD-10-CM | POA: Diagnosis not present

## 2017-07-18 DIAGNOSIS — I6529 Occlusion and stenosis of unspecified carotid artery: Secondary | ICD-10-CM | POA: Diagnosis not present

## 2017-07-19 ENCOUNTER — Ambulatory Visit
Admission: RE | Admit: 2017-07-19 | Discharge: 2017-07-19 | Disposition: A | Discharge status: Home / Self Care | Payer: PPO | Source: Ambulatory Visit | Attending: Internal Medicine | Admitting: Internal Medicine

## 2017-07-19 ENCOUNTER — Other Ambulatory Visit: Payer: Self-pay | Admitting: Internal Medicine

## 2017-07-19 DIAGNOSIS — Z1231 Encounter for screening mammogram for malignant neoplasm of breast: Secondary | ICD-10-CM

## 2017-07-19 DIAGNOSIS — R911 Solitary pulmonary nodule: Secondary | ICD-10-CM

## 2017-07-20 ENCOUNTER — Ambulatory Visit
Admission: RE | Admit: 2017-07-20 | Discharge: 2017-07-20 | Disposition: A | Discharge status: Home / Self Care | Payer: PPO | Source: Ambulatory Visit | Attending: Internal Medicine | Admitting: Internal Medicine

## 2017-07-20 DIAGNOSIS — R911 Solitary pulmonary nodule: Secondary | ICD-10-CM

## 2017-07-20 DIAGNOSIS — R590 Localized enlarged lymph nodes: Secondary | ICD-10-CM | POA: Diagnosis not present

## 2017-07-25 DIAGNOSIS — G4733 Obstructive sleep apnea (adult) (pediatric): Secondary | ICD-10-CM | POA: Diagnosis not present

## 2017-07-26 DIAGNOSIS — M25512 Pain in left shoulder: Secondary | ICD-10-CM | POA: Diagnosis not present

## 2017-07-26 DIAGNOSIS — M25511 Pain in right shoulder: Secondary | ICD-10-CM | POA: Diagnosis not present

## 2017-08-07 DIAGNOSIS — J309 Allergic rhinitis, unspecified: Secondary | ICD-10-CM | POA: Diagnosis not present

## 2017-08-07 DIAGNOSIS — J432 Centrilobular emphysema: Secondary | ICD-10-CM | POA: Diagnosis not present

## 2017-08-09 DIAGNOSIS — S46012D Strain of muscle(s) and tendon(s) of the rotator cuff of left shoulder, subsequent encounter: Secondary | ICD-10-CM | POA: Diagnosis not present

## 2017-08-09 DIAGNOSIS — M25511 Pain in right shoulder: Secondary | ICD-10-CM | POA: Diagnosis not present

## 2017-08-09 DIAGNOSIS — M25512 Pain in left shoulder: Secondary | ICD-10-CM | POA: Diagnosis not present

## 2017-08-09 DIAGNOSIS — M7541 Impingement syndrome of right shoulder: Secondary | ICD-10-CM | POA: Diagnosis not present

## 2017-10-16 DIAGNOSIS — F331 Major depressive disorder, recurrent, moderate: Secondary | ICD-10-CM | POA: Diagnosis not present

## 2017-11-08 DIAGNOSIS — N390 Urinary tract infection, site not specified: Secondary | ICD-10-CM | POA: Diagnosis not present

## 2017-11-13 DIAGNOSIS — N390 Urinary tract infection, site not specified: Secondary | ICD-10-CM | POA: Diagnosis not present

## 2017-11-13 DIAGNOSIS — N3281 Overactive bladder: Secondary | ICD-10-CM | POA: Diagnosis not present

## 2017-12-28 ENCOUNTER — Encounter: Payer: Self-pay | Admitting: Emergency Medicine

## 2017-12-28 DIAGNOSIS — F411 Generalized anxiety disorder: Secondary | ICD-10-CM

## 2018-01-10 ENCOUNTER — Ambulatory Visit (INDEPENDENT_AMBULATORY_CARE_PROVIDER_SITE_OTHER): Payer: PPO | Admitting: Psychiatry

## 2018-01-10 DIAGNOSIS — F411 Generalized anxiety disorder: Secondary | ICD-10-CM

## 2018-01-10 DIAGNOSIS — F331 Major depressive disorder, recurrent, moderate: Secondary | ICD-10-CM | POA: Diagnosis not present

## 2018-01-10 MED ORDER — DULOXETINE HCL 60 MG PO CPEP: 60.0000 mg | ORAL_CAPSULE | Freq: Every day | ORAL | 5 refills | Status: DC

## 2018-01-10 NOTE — Progress Notes (Signed)
[facility]  Patient ID: Candice Velasquez,  MRN: [id number]  PCP: Merri Brunette, MD  Date of Evaluation: 01/10/2018 Time spent:30 minutes   HISTORY/CURRENT STATUS: HPI  CC: FU dep, anxiety and health.  Pt reports that mood is Anxious and Depressed and describes anxiety as Minimal. Anxiety symptoms include: Excessive Worry,. Pt reports no sleep issues since separating from H. Pt reports that appetite is good. Pt reports that energy is poor and poor motivation. Concentration is down slightly. Suicidal thoughts:  denied by patient. Can't make herself be productive unless a deadline. Enjoys grandson and some customers at work. April to July took duloxetine took 90mg   And cut it back to 60mg  bc of hot.  No changes since reducing the duloxetine 4-6 weeks ago.  Feels ok leaving at 61. Individual Medical History/ Review of Systems: Changes? :Not using CPAP since move.  Allergies: Patient has no known allergies.  Current Medications:  Current Outpatient Medications:  .  aspirin EC 81 MG tablet, Take 81 mg by mouth daily., Disp: , Rfl:  .  cyanocobalamin 100 MCG tablet, Take 100 mcg by mouth daily., Disp: , Rfl:  .  DULoxetine (CYMBALTA) 60 MG capsule, Take 60 mg by mouth daily., Disp: , Rfl:  .  LORazepam (ATIVAN) 1 MG tablet, , Disp: , Rfl:  .  MYRBETRIQ 25 MG TB24 tablet, , Disp: , Rfl:  .  DULoxetine (CYMBALTA) 30 MG capsule, Take 30 mg by mouth daily., Disp: , Rfl:  .  ENABLEX 7.5 MG 24 hr tablet, , Disp: , Rfl:  .  fluconazole (DIFLUCAN) 100 MG tablet, , Disp: , Rfl:  .  GARLIC PO, Take by mouth daily., Disp: , Rfl:  .  HYDROcodone-acetaminophen (NORCO/VICODIN) 5-325 MG per tablet, , Disp: , Rfl:  .  IBUPROFEN PO, Take by mouth as needed., Disp: , Rfl:  .  ketoconazole (NIZORAL) 2 % cream, Apply 1 application topically daily., Disp: , Rfl:  .  meloxicam (MOBIC) 7.5 MG tablet, Take 7.5 mg by mouth as needed for pain., Disp: , Rfl:  .  methylPREDNIsolone (MEDROL  DOSPACK) 4 MG tablet, , Disp: , Rfl:  .  mupirocin cream (BACTROBAN) 2 %, Apply 1 application topically 2 (two) times daily., Disp: 15 g, Rfl: 0 .  naloxegol oxalate (MOVANTIK) 25 MG TABS tablet, Take by mouth daily., Disp: , Rfl:  .  naloxegol oxalate (MOVANTIK) 25 MG TABS tablet, Take by mouth daily., Disp: , Rfl:  .  traMADol (ULTRAM) 50 MG tablet, , Disp: , Rfl:  .  triamcinolone cream (KENALOG) 0.1 %, Apply 1 application topically 2 (two) times daily., Disp: , Rfl:  .  triamterene-hydrochlorothiazide (MAXZIDE-25) 37.5-25 MG per tablet, , Disp: , Rfl:  .  TURMERIC PO, Take by mouth daily., Disp: , Rfl:  .  VESICARE 10 MG tablet, , Disp: , Rfl:  .  zaleplon (SONATA) 5 MG capsule, Take 5 mg by mouth at bedtime as needed for sleep., Disp: , Rfl:  Medication Side Effects: None  Family Medical/ Social History: Changes? Yes Separated.  Working.  Helps with grandson.  MENTAL HEALTH EXAM:  There were no vitals taken for this visit.There is no height or weight on file to calculate BMI.  General Appearance: Casual  Eye Contact:  Good  Speech:  Normal Rate  Volume:  Normal  Mood:  Anxious, Depressed and not severe  Affect:  Appropriate  Thought Process:  Goal Directed  Orientation:  Full (Time, Place, and Person)  Thought Content:  Logical   Suicidal Thoughts:  No  Homicidal Thoughts:  No  Memory:  Recent  Judgement:  Good  Insight:  Fair  Psychomotor Activity:  Normal  Concentration:  Concentration: Good  Recall:  Good  Fund of Knowledge: Good  Language: Good  Akathisia:  No  AIMS (if indicated): not done  Assets:  Desire for Improvement Housing Leisure Time Transportation Vocational/Educational  ADL's:  Intact  Cognition: WNL  Prognosis:  Fair    DIAGNOSES:    ICD-10-CM   1. Major depressive disorder, recurrent episode, moderate (HCC) F33.1   2. Generalized anxiety disorder F41.1     RECOMMENDATIONS: Greater than 50% of face to face time with patient was spent on  counseling and coordination of care. We discussed dosing of duloxetine and the absence of benefit for energy and motivation.  Sleep better separated. Disc alternatives for this. Start using CPAP for energy and brain health.  Disc this in detail. OK to reduce duloxetine to 60 mg/D as she has done.  FU    Lauraine Rinne, MD

## 2018-01-10 NOTE — Patient Instructions (Addendum)
Start using CPAP for energy and brain health. Consider [facility] for supplies.

## 2018-01-12 DIAGNOSIS — Z6830 Body mass index (BMI) 30.0-30.9, adult: Secondary | ICD-10-CM | POA: Diagnosis not present

## 2018-01-12 DIAGNOSIS — Z01419 Encounter for gynecological examination (general) (routine) without abnormal findings: Secondary | ICD-10-CM | POA: Diagnosis not present

## 2018-01-18 DIAGNOSIS — K529 Noninfective gastroenteritis and colitis, unspecified: Secondary | ICD-10-CM | POA: Diagnosis not present

## 2018-01-18 DIAGNOSIS — R110 Nausea: Secondary | ICD-10-CM | POA: Diagnosis not present

## 2018-01-18 DIAGNOSIS — E119 Type 2 diabetes mellitus without complications: Secondary | ICD-10-CM | POA: Diagnosis not present

## 2018-04-05 DIAGNOSIS — I10 Essential (primary) hypertension: Secondary | ICD-10-CM | POA: Diagnosis not present

## 2018-04-05 DIAGNOSIS — M25562 Pain in left knee: Secondary | ICD-10-CM | POA: Diagnosis not present

## 2018-04-05 DIAGNOSIS — M25511 Pain in right shoulder: Secondary | ICD-10-CM | POA: Diagnosis not present

## 2018-04-05 DIAGNOSIS — M25561 Pain in right knee: Secondary | ICD-10-CM | POA: Diagnosis not present

## 2018-04-05 DIAGNOSIS — M19011 Primary osteoarthritis, right shoulder: Secondary | ICD-10-CM | POA: Diagnosis not present

## 2018-04-05 DIAGNOSIS — M1712 Unilateral primary osteoarthritis, left knee: Secondary | ICD-10-CM | POA: Diagnosis not present

## 2018-04-05 DIAGNOSIS — M19012 Primary osteoarthritis, left shoulder: Secondary | ICD-10-CM | POA: Diagnosis not present

## 2018-04-05 DIAGNOSIS — M25512 Pain in left shoulder: Secondary | ICD-10-CM | POA: Diagnosis not present

## 2018-04-06 DIAGNOSIS — M25561 Pain in right knee: Secondary | ICD-10-CM | POA: Diagnosis not present

## 2018-05-02 ENCOUNTER — Ambulatory Visit: Payer: PPO | Admitting: Psychiatry

## 2018-05-19 ENCOUNTER — Other Ambulatory Visit: Payer: Self-pay | Admitting: Psychiatry

## 2018-06-19 ENCOUNTER — Other Ambulatory Visit: Payer: Self-pay | Admitting: Emergency Medicine

## 2018-06-19 DIAGNOSIS — S0081XA Abrasion of other part of head, initial encounter: Secondary | ICD-10-CM

## 2018-07-10 ENCOUNTER — Other Ambulatory Visit: Payer: Self-pay | Admitting: Internal Medicine

## 2018-07-10 DIAGNOSIS — R911 Solitary pulmonary nodule: Secondary | ICD-10-CM

## 2018-07-23 DIAGNOSIS — I10 Essential (primary) hypertension: Secondary | ICD-10-CM | POA: Diagnosis not present

## 2018-07-23 DIAGNOSIS — E119 Type 2 diabetes mellitus without complications: Secondary | ICD-10-CM | POA: Diagnosis not present

## 2018-07-23 DIAGNOSIS — Z1159 Encounter for screening for other viral diseases: Secondary | ICD-10-CM | POA: Diagnosis not present

## 2018-07-27 DIAGNOSIS — J309 Allergic rhinitis, unspecified: Secondary | ICD-10-CM | POA: Diagnosis not present

## 2018-07-27 DIAGNOSIS — Z23 Encounter for immunization: Secondary | ICD-10-CM | POA: Diagnosis not present

## 2018-07-27 DIAGNOSIS — E119 Type 2 diabetes mellitus without complications: Secondary | ICD-10-CM | POA: Diagnosis not present

## 2018-07-27 DIAGNOSIS — F439 Reaction to severe stress, unspecified: Secondary | ICD-10-CM | POA: Diagnosis not present

## 2018-07-27 DIAGNOSIS — F17210 Nicotine dependence, cigarettes, uncomplicated: Secondary | ICD-10-CM | POA: Diagnosis not present

## 2018-07-27 DIAGNOSIS — F339 Major depressive disorder, recurrent, unspecified: Secondary | ICD-10-CM | POA: Diagnosis not present

## 2018-07-27 DIAGNOSIS — J432 Centrilobular emphysema: Secondary | ICD-10-CM | POA: Diagnosis not present

## 2018-07-27 DIAGNOSIS — I6529 Occlusion and stenosis of unspecified carotid artery: Secondary | ICD-10-CM | POA: Diagnosis not present

## 2018-07-27 DIAGNOSIS — F5104 Psychophysiologic insomnia: Secondary | ICD-10-CM | POA: Diagnosis not present

## 2018-07-27 DIAGNOSIS — Z0000 Encounter for general adult medical examination without abnormal findings: Secondary | ICD-10-CM | POA: Diagnosis not present

## 2018-07-27 DIAGNOSIS — M545 Low back pain: Secondary | ICD-10-CM | POA: Diagnosis not present

## 2018-07-27 DIAGNOSIS — N3946 Mixed incontinence: Secondary | ICD-10-CM | POA: Diagnosis not present

## 2018-07-27 DIAGNOSIS — I10 Essential (primary) hypertension: Secondary | ICD-10-CM | POA: Diagnosis not present

## 2018-08-21 DIAGNOSIS — H8113 Benign paroxysmal vertigo, bilateral: Secondary | ICD-10-CM | POA: Diagnosis not present

## 2018-08-21 DIAGNOSIS — R42 Dizziness and giddiness: Secondary | ICD-10-CM | POA: Diagnosis not present

## 2018-08-29 DIAGNOSIS — R42 Dizziness and giddiness: Secondary | ICD-10-CM | POA: Diagnosis not present

## 2018-08-29 DIAGNOSIS — H8113 Benign paroxysmal vertigo, bilateral: Secondary | ICD-10-CM | POA: Diagnosis not present

## 2018-09-13 IMAGING — CT CT CHEST W/O CM
1 of 2 series · 14 of 30 positions shown, 18 images · non-contrast
Comparison: 06/09/2015

CLINICAL DATA: 66-year-old female with a history of prior low-dose
CT lung cancer screening with benign result

EXAM:
CT CHEST WITHOUT CONTRAST
TECHNIQUE: Multidetector CT imaging of the chest was performed following the
standard protocol without IV contrast.

[Series 2: chest w/(date) · axial · 0.71mm/px · z∈[-284,-10]mm · 14 of 161 slices shown, 18 images]
[im 12/161  mediastinal]
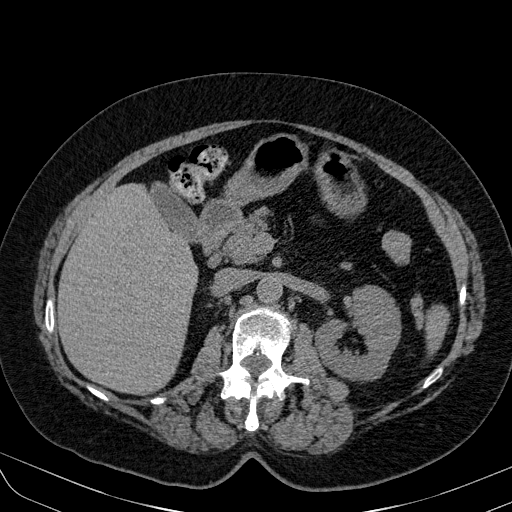
[im 12/161  lung]
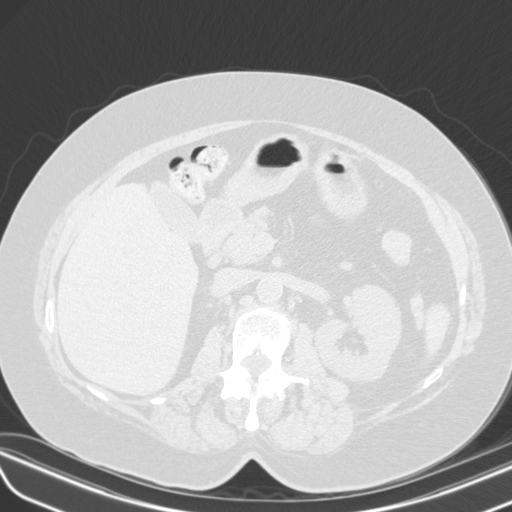
[im 23/161  lung]
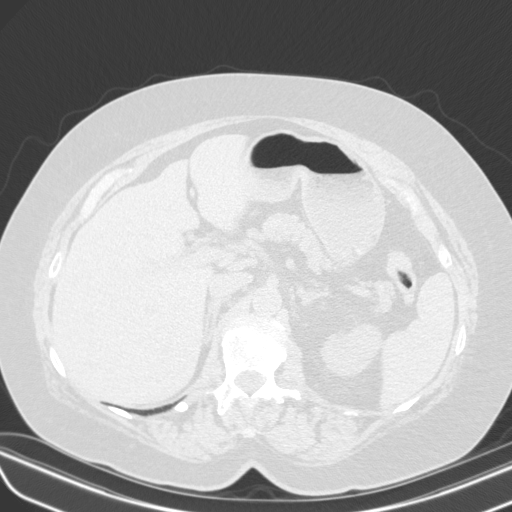
[im 35/161  lung]
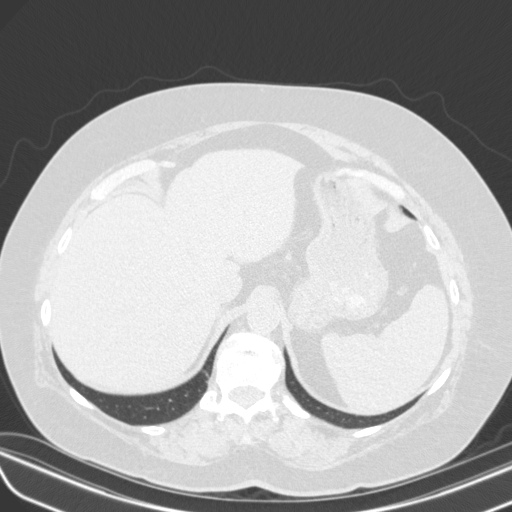
[im 46/161  lung]
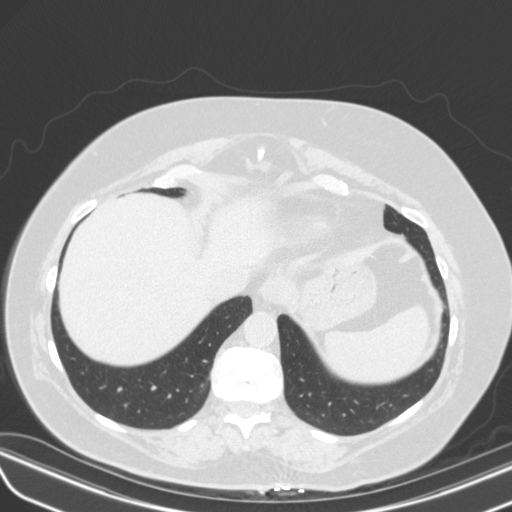
[im 58/161  mediastinal]
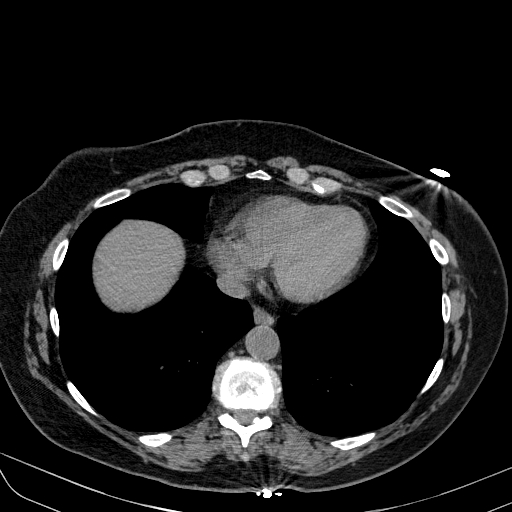
[im 58/161  lung]
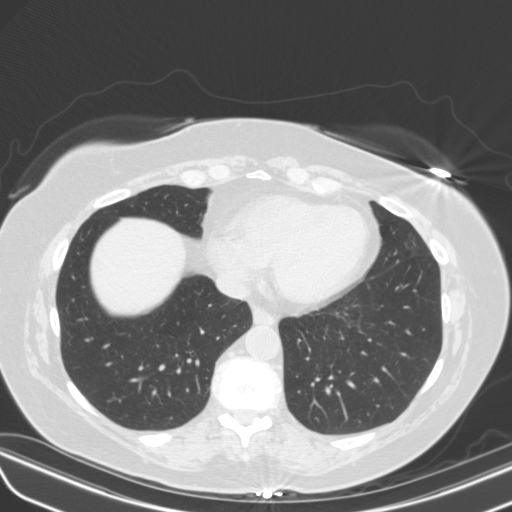
[im 69/161  lung]
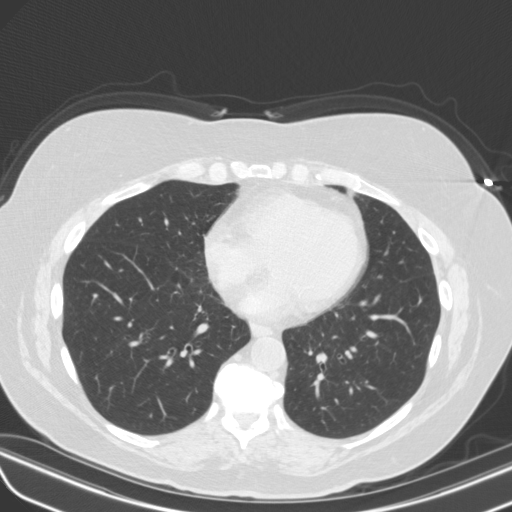
[im 76/161  lung]
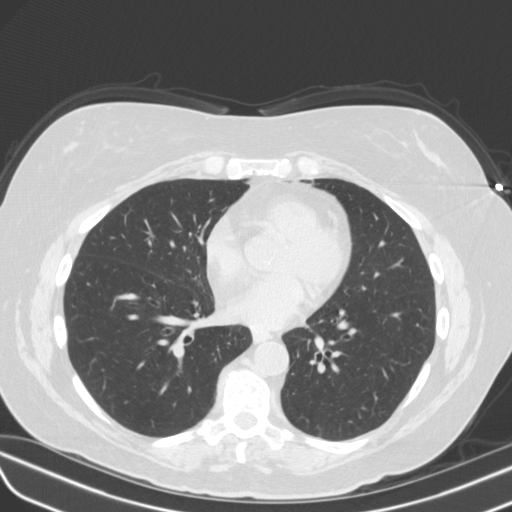
[im 81/161  lung]
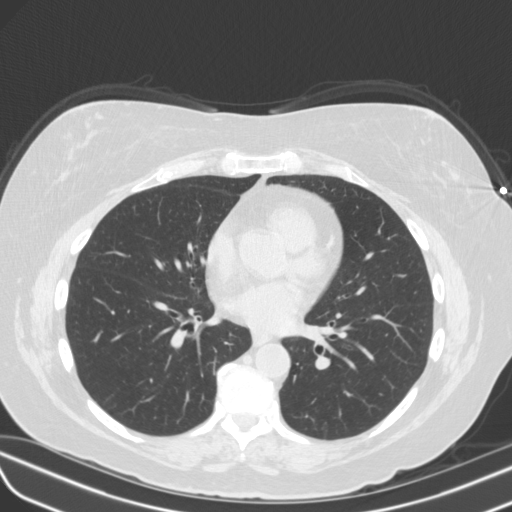
[im 92/161  mediastinal]
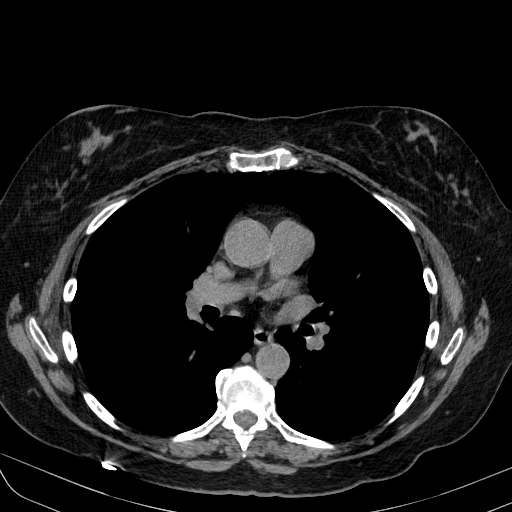
[im 92/161  lung]
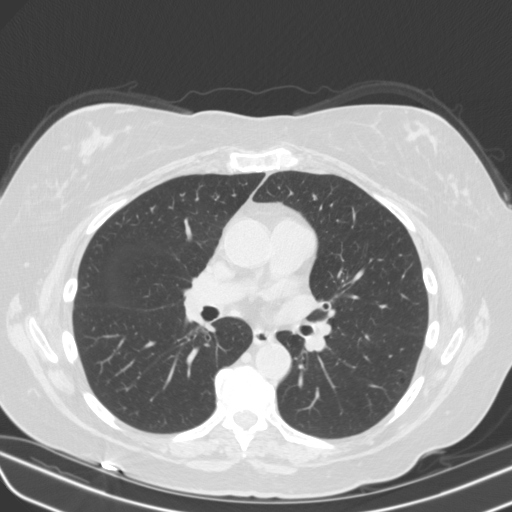
[im 103/161  lung]
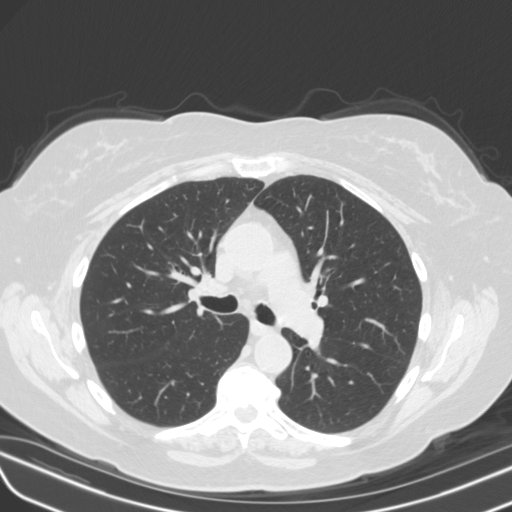
[im 115/161  lung]
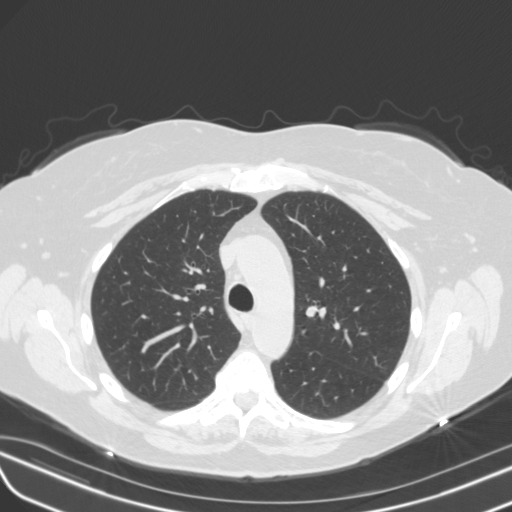
[im 126/161  lung]
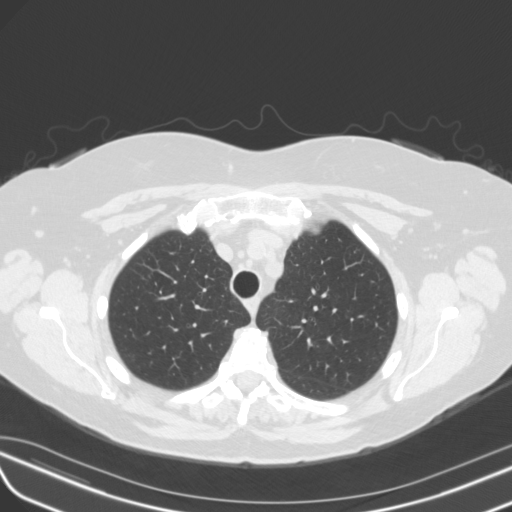
[im 138/161  mediastinal]
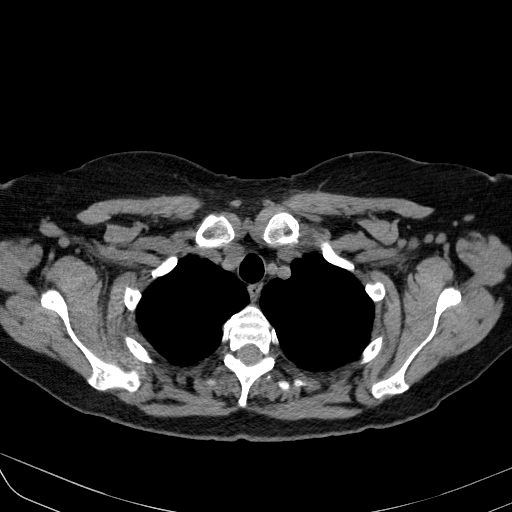
[im 138/161  lung]
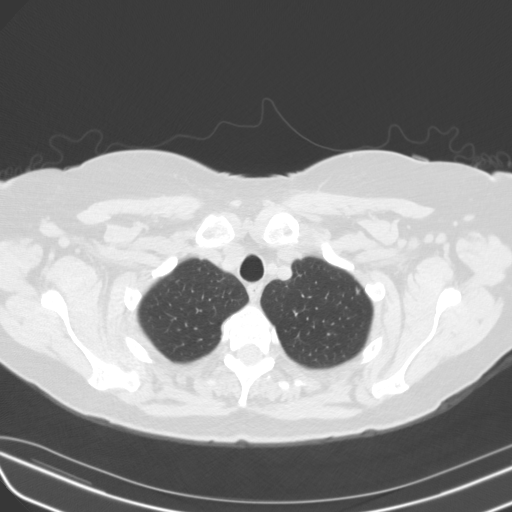
[im 149/161  lung]
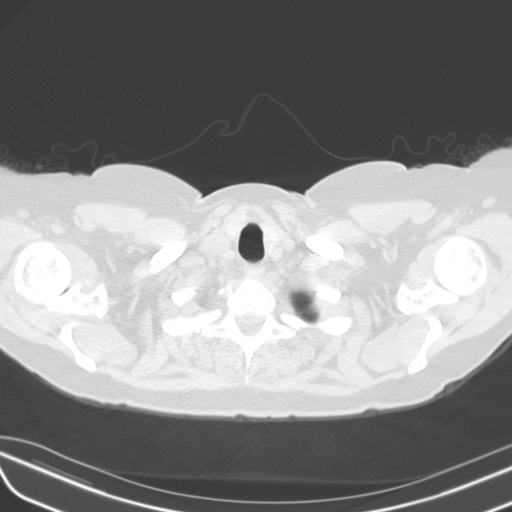

[14 of 30 positions shown; findings below may reference images not displayed]

FINDINGS: Cardiovascular: Heart size within normal limits. No pericardial
fluid/thickening. Calcifications of left main coronary artery. No
evidence of aortic aneurysm. No significant calcifications of the
aorta.

Mediastinum/Nodes: No mediastinal adenopathy. Small lymph nodes are
present, unchanged from prior. Unremarkable appearance of the
thoracic esophagus

Lungs/Pleura: No pneumothorax or pleural effusion. No confluent
airspace disease. No endotracheal or endobronchial debris.
Nodule/scarring at the left lung apex, subpleural location is
unchanged from the comparison when a benign result was assigned.

Upper Abdomen: No acute abnormality.

Musculoskeletal: Degenerative changes of the thoracic spine. No bony
canal narrowing. No acute displaced fracture.
IMPRESSION: No acute finding.

Unchanged appearance of the lungs compared to the low-dose screening
CT from 2008. Tiny scarring/nodule at the left apex, previously
designated benign. Would recommend that the patient resume
surveillance in low-dose CT screening program, if remains a
candidate.

Calcifications of the left anterior descending coronary artery.

## 2018-10-26 ENCOUNTER — Ambulatory Visit: Payer: PPO | Admitting: Psychiatry

## 2018-10-26 ENCOUNTER — Encounter

## 2018-11-21 DIAGNOSIS — M25562 Pain in left knee: Secondary | ICD-10-CM | POA: Diagnosis not present

## 2018-11-21 DIAGNOSIS — S299XXA Unspecified injury of thorax, initial encounter: Secondary | ICD-10-CM | POA: Diagnosis not present

## 2018-11-21 DIAGNOSIS — R0781 Pleurodynia: Secondary | ICD-10-CM | POA: Diagnosis not present

## 2018-11-21 DIAGNOSIS — S8992XA Unspecified injury of left lower leg, initial encounter: Secondary | ICD-10-CM | POA: Diagnosis not present

## 2018-11-23 DIAGNOSIS — S8392XA Sprain of unspecified site of left knee, initial encounter: Secondary | ICD-10-CM | POA: Diagnosis not present

## 2018-11-23 DIAGNOSIS — M25562 Pain in left knee: Secondary | ICD-10-CM | POA: Diagnosis not present

## 2019-01-08 ENCOUNTER — Other Ambulatory Visit: Payer: Self-pay | Admitting: Psychiatry

## 2019-01-08 NOTE — Telephone Encounter (Signed)
Has been seen since 12/2017

## 2019-01-08 NOTE — Telephone Encounter (Signed)
Call to reschedule

## 2019-02-07 ENCOUNTER — Other Ambulatory Visit: Payer: Self-pay | Admitting: Psychiatry

## 2019-02-07 ENCOUNTER — Telehealth: Payer: Self-pay | Admitting: Psychiatry

## 2019-02-07 MED ORDER — DULOXETINE HCL 60 MG PO CPEP: 60.0000 mg | ORAL_CAPSULE | Freq: Every day | ORAL | 0 refills | Status: DC

## 2019-02-07 NOTE — Telephone Encounter (Signed)
Pt made appt Dec 9. Asks for refill of Cymbalta 60mg .  [facility]  [address]

## 2019-02-27 ENCOUNTER — Encounter: Payer: Self-pay | Admitting: Psychiatry

## 2019-02-27 ENCOUNTER — Other Ambulatory Visit: Payer: Self-pay

## 2019-02-27 ENCOUNTER — Ambulatory Visit (INDEPENDENT_AMBULATORY_CARE_PROVIDER_SITE_OTHER): Payer: PPO | Admitting: Psychiatry

## 2019-02-27 DIAGNOSIS — F411 Generalized anxiety disorder: Secondary | ICD-10-CM | POA: Diagnosis not present

## 2019-02-27 DIAGNOSIS — F331 Major depressive disorder, recurrent, moderate: Secondary | ICD-10-CM | POA: Diagnosis not present

## 2019-02-27 DIAGNOSIS — F17210 Nicotine dependence, cigarettes, uncomplicated: Secondary | ICD-10-CM

## 2019-02-27 MED ORDER — LORAZEPAM 1 MG PO TABS: 1.0000 mg | ORAL_TABLET | Freq: Every day | ORAL | 0 refills | Status: DC | PRN

## 2019-02-27 MED ORDER — BUPROPION HCL ER (XL) 150 MG PO TB24: ORAL_TABLET | ORAL | 0 refills | Status: DC

## 2019-02-27 MED ORDER — DULOXETINE HCL 60 MG PO CPEP: 60.0000 mg | ORAL_CAPSULE | Freq: Every day | ORAL | 1 refills | Status: DC

## 2019-02-27 NOTE — Progress Notes (Signed)
Candice Velasquez [id number] 1951/01/28 68 y.o.  Subjective:   Patient ID:  Candice Velasquez is a 68 y.o. (DOB 1950-11-07) female.  Chief Complaint:  Chief Complaint  Patient presents with  . Follow-up     Medication Management  . Depression     Medication Management  . Anxiety     Medication Management  . Medication Refill    Duloxetine-[employer],     HPI [name] presents to the office today for follow-up of depression, anxiety, and insomnia.  Last seen October 2019.  Duloxetine was reduced to 60 mg daily per her request.  She had been diagnosed with sleep apnea and was encouraged to use CPAP.  Sleeping too much.  Working until 10 pm.  Separated since April 2019.  Down over poor relationship with D and that interferes with contact with grandchild.  ExH has renal cancer and stroke 2018 and Hx mania from prednisone.  Smokes too much.  Some depression over the situation.  I'm depressed.     Not using CPAP bc hates the machine from [place].    Not drinking Vodka.  Occ [place] in summer.    Past Psychiatric Medication Trials: Duloxetine 90, Wellbutrin, mirtazapine, Trintellix, fluoxetine, venlafaxine, Viibryd, sertraline, Lithium Seroquel, Abilify side effects, lamotrigine,  Vyvanse, Ritalin, buspirone, trazodone, Belsomra, Lunesta side effects, diazepam, lorazepam, Sonata,  doxepin,  temazepam, Chantix, clonazepam, Tranxene, Ambien  Under care of this office since November 1998  Review of Systems:  Review of Systems  Genitourinary: Positive for enuresis.  Musculoskeletal: Positive for arthralgias, back pain and gait problem.  Neurological: Positive for dizziness. Negative for tremors and weakness.    Medications: I have reviewed the patient's current medications.  Current Outpatient Medications  Medication Sig Dispense Refill  . Cranberry 500 MG CAPS Take by mouth.    . DULoxetine (CYMBALTA) 60 MG capsule Take 1 capsule (60 mg total) by mouth  daily. 30 capsule 1  . Misc Natural Products (APPLE CIDER VINEGAR DIET PO) Take by mouth.    . Multiple Vitamin (MULTIVITAMIN) capsule Take 1 capsule by mouth daily.    . Saccharomyces boulardii (PROBIOTIC) 250 MG CAPS Take by mouth.    . triamcinolone cream (KENALOG) 0.1 % Apply 1 application topically 2 (two) times daily.    Marland Kitchen triamterene-hydrochlorothiazide (MAXZIDE-25) 37.5-25 MG per tablet     . buPROPion (WELLBUTRIN XL) 150 MG 24 hr tablet 1 in the morning for 1 week then 2 each morning 30 tablet 0  . LORazepam (ATIVAN) 1 MG tablet Take 1 tablet (1 mg total) by mouth daily as needed for anxiety. 30 tablet 0   No current facility-administered medications for this visit.     Medication Side Effects: None  Allergies: No Known Allergies  Past Medical History:  Diagnosis Date  . Depression   . Diabetes mellitus without complication ([place])   . Hypertension     History reviewed. No pertinent family history.  Social History   Socioeconomic History  . Marital status: Legally Separated    Spouse name: Not on file  . Number of children: Not on file  . Years of education: Not on file  . Highest education level: Not on file  Occupational History  . Not on file  Social Needs  . Financial resource strain: Not on file  . Food insecurity    Worry: Not on file    Inability: Not on file  . Transportation needs    Medical: Not on file  Non-medical: Not on file  Tobacco Use  . Smoking status: Current Every Day Smoker    Packs/day: 1.00    Years: 31.00    Pack years: 31.00    Types: Cigarettes    Last attempt to quit: 02/18/2015    Years since quitting: 4.0  . Smokeless tobacco: Never Used  . Tobacco comment: Counseled to remain smoke free  Substance and Sexual Activity  . Alcohol use: Yes    Alcohol/week: 0.0 standard drinks  . Drug use: Not on file  . Sexual activity: Not on file  Lifestyle  . Physical activity    Days per week: Not on file    Minutes per session: Not  on file  . Stress: Not on file  Relationships  . Social Herbalist on phone: Not on file    Gets together: Not on file    Attends religious service: Not on file    Active member of club or organization: Not on file    Attends meetings of clubs or organizations: Not on file    Relationship status: Not on file  . Intimate partner violence    Fear of current or ex partner: Not on file    Emotionally abused: Not on file    Physically abused: Not on file    Forced sexual activity: Not on file  Other Topics Concern  . Not on file  Social History Narrative  . Not on file    Past Medical History, Surgical history, Social history, and Family history were reviewed and updated as appropriate.   Please see review of systems for further details on the patient's review from today.   Objective:   Physical Exam:  There were no vitals taken for this visit.  Physical Exam Constitutional:      General: She is not in acute distress.    Appearance: She is well-developed.  Musculoskeletal:        General: No deformity.  Neurological:     Mental Status: She is alert and oriented to person, place, and time.     Coordination: Coordination abnormal.     Comments: Using cane  Psychiatric:        Attention and Perception: Attention and perception normal. She does not perceive auditory or visual hallucinations.        Mood and Affect: Mood normal. Mood is not anxious or depressed. Affect is not labile, blunt, angry or inappropriate.        Speech: Speech normal.        Behavior: Behavior normal.        Thought Content: Thought content normal. Thought content does not include homicidal or suicidal ideation. Thought content does not include homicidal or suicidal plan.        Cognition and Memory: Cognition and memory normal.        Judgment: Judgment normal.     Comments: Insight intact. No delusions.      Lab Review:     Component Value Date/Time   NA 138 12/12/2007 1320   K 3.9  12/12/2007 1320   CL 105 12/12/2007 1320   CO2 26 12/12/2007 1320   GLUCOSE 107 (H) 12/12/2007 1320   BUN 8 12/12/2007 1320   CREATININE 0.54 12/12/2007 1320   CALCIUM 9.5 12/12/2007 1320   PROT 6.6 12/12/2007 1320   ALBUMIN 3.9 12/12/2007 1320   AST 22 12/12/2007 1320   ALT 23 12/12/2007 1320   ALKPHOS 52 12/12/2007 1320   BILITOT  0.7 12/12/2007 1320   GFRNONAA >60 12/12/2007 1320   GFRAA  12/12/2007 1320    >60        The eGFR has been calculated using the MDRD equation. This calculation has not been validated in all clinical       Component Value Date/Time   WBC 5.6 12/12/2007 1320   RBC 4.00 12/12/2007 1320   HGB 13.8 12/12/2007 1320   HCT 40.1 12/12/2007 1320   PLT 239 12/12/2007 1320   MCV 100.3 (H) 12/12/2007 1320   MCHC 34.4 12/12/2007 1320   RDW 13.6 12/12/2007 1320   LYMPHSABS 1.7 12/12/2007 1320   MONOABS 0.5 12/12/2007 1320   EOSABS 0.2 12/12/2007 1320   BASOSABS 0.0 12/12/2007 1320   Sleep study several years ago and dx OSA but not using CPAP  No results found for: POCLITH, LITHIUM   No results found for: PHENYTOIN, PHENOBARB, VALPROATE, CBMZ   .res Assessment: Plan:    Suleika was seen today for follow-up, depression, anxiety and medication refill.  Diagnoses and all orders for this visit:  Major depressive disorder, recurrent episode, moderate (HCC) -     buPROPion (WELLBUTRIN XL) 150 MG 24 hr tablet; 1 in the morning for 1 week then 2 each morning -     DULoxetine (CYMBALTA) 60 MG capsule; Take 1 capsule (60 mg total) by mouth daily.  Cigarette smoker motivated to quit -     buPROPion (WELLBUTRIN XL) 150 MG 24 hr tablet; 1 in the morning for 1 week then 2 each morning  Generalized anxiety disorder -     LORazepam (ATIVAN) 1 MG tablet; Take 1 tablet (1 mg total) by mouth daily as needed for anxiety.     Greater than 50% of 35 min face to face time with patient was spent on counseling and coordination of care. 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  Consider TCA.  Prefer to avoid MAOI.  TCA might help bladder also.  Consider Rexulti if can afford it.  Consider Latuda  Extensive discussion of OSA and need for   Reviewed old chart with her regarding prior meds she is taking.  She took Wellbutrin in 2005 for a period of time and it did help her to quit smoking briefly.  She states she is motivated to quit smoking now.  It could potentially help with depression.  She even accidentally took Wellbutrin 600 mg daily for a period of time.  That made her irritable.  She wants to try Wellbutrin again to help with depression and smoking cessation. Start bupropion XL 150 mg tablet 1 each morning for 1 week, Then increase to 2 each morning which equals 300 mg daily. After running out of the 150 mg tablets the refill will be for a Wellbutrin or bupropion XL 300 mg tablet and he will take just 1 a day in the morning  Disc SE.  Continue duloxetine 60 daily  Ok prn lorazepam.  She is not taking anything for sleep right now and is sleeping okay though she does have a history of chronic insomnia.  FU 6-8 weeks  Lynder Parents, MD, DFAPA  Please see After Visit Summary for patient specific instructions.  No future appointments.  No orders of the defined types were placed in this encounter.   -------------------------------

## 2019-02-27 NOTE — Patient Instructions (Signed)
Start bupropion XL 150 mg tablet 1 each morning for 1 week, Then increase to 2 each morning which equals 300 mg daily.  After running out of the 150 mg tablets the refill will be for a Wellbutrin or bupropion XL 300 mg tablet and he will take just 1 a day in the morning

## 2019-03-07 DIAGNOSIS — Z20828 Contact with and (suspected) exposure to other viral communicable diseases: Secondary | ICD-10-CM | POA: Diagnosis not present

## 2019-03-07 DIAGNOSIS — Z03818 Encounter for observation for suspected exposure to other biological agents ruled out: Secondary | ICD-10-CM | POA: Diagnosis not present

## 2019-03-28 DIAGNOSIS — M19011 Primary osteoarthritis, right shoulder: Secondary | ICD-10-CM | POA: Diagnosis not present

## 2019-03-28 DIAGNOSIS — M25562 Pain in left knee: Secondary | ICD-10-CM | POA: Diagnosis not present

## 2019-03-28 DIAGNOSIS — M25511 Pain in right shoulder: Secondary | ICD-10-CM | POA: Diagnosis not present

## 2019-03-29 ENCOUNTER — Telehealth: Payer: Self-pay | Admitting: Psychiatry

## 2019-03-29 NOTE — Telephone Encounter (Signed)
Patient called and said that she is taking the welbutrinxl 150 mg. She is taking 2 a day. This is making her very anxious,jittery,hands shakey. She wants to know can she go back to down to 1 tab a day or can she stop taking it.She also wants dr. Jennelle Human to know that her doctor has put her on meloxam for pain from a recent fall. She wants to knowe if that is ok to take with her medicines. Please give her a call at [phone]

## 2019-03-29 NOTE — Telephone Encounter (Signed)
The side effects will get markedly better if she reduces the dose.  Reducing the Wellbutrin to one of the XL 150 mg tablets daily.  We just started it in December and it may still help with mood and energy if she gives the lower dosage chance.  The side effects will go away within a day or 2.

## 2019-03-29 NOTE — Telephone Encounter (Signed)
Pt. Made aware.

## 2019-04-03 ENCOUNTER — Other Ambulatory Visit: Payer: Self-pay | Admitting: Psychiatry

## 2019-04-03 DIAGNOSIS — F331 Major depressive disorder, recurrent, moderate: Secondary | ICD-10-CM

## 2019-04-03 DIAGNOSIS — F17210 Nicotine dependence, cigarettes, uncomplicated: Secondary | ICD-10-CM

## 2019-04-04 NOTE — Telephone Encounter (Signed)
See new message from patient.

## 2019-04-04 NOTE — Telephone Encounter (Signed)
Pt called to report she is having a lot of dizziness in the morning.  Started after taking Bupropion. Should she wean off this medication or will it continue? Return call @ [phone] before refilling

## 2019-04-04 NOTE — Telephone Encounter (Signed)
Change her Rx to 300 mg? Or keep the same

## 2019-04-04 NOTE — Telephone Encounter (Signed)
Try a different approach.  Split Wellbutrin XL to 150 mg in AM and noon

## 2019-04-05 NOTE — Telephone Encounter (Signed)
Sounds like she is having dizziness with Wellbutrin even at 150 mg a day.  Therefore stopped the Wellbutrin.

## 2019-04-05 NOTE — Telephone Encounter (Signed)
Pt. Made aware.

## 2019-04-05 NOTE — Telephone Encounter (Signed)
Left patient detailed voicemail on personal mobile with instructions. Will submit refill for Wellbutrin XL 150 mg bid

## 2019-04-15 DIAGNOSIS — M546 Pain in thoracic spine: Secondary | ICD-10-CM | POA: Diagnosis not present

## 2019-04-30 ENCOUNTER — Ambulatory Visit (INDEPENDENT_AMBULATORY_CARE_PROVIDER_SITE_OTHER): Payer: PPO | Admitting: Psychiatry

## 2019-04-30 ENCOUNTER — Encounter: Payer: Self-pay | Admitting: Psychiatry

## 2019-04-30 ENCOUNTER — Other Ambulatory Visit: Payer: Self-pay

## 2019-04-30 DIAGNOSIS — F331 Major depressive disorder, recurrent, moderate: Secondary | ICD-10-CM | POA: Diagnosis not present

## 2019-04-30 DIAGNOSIS — F411 Generalized anxiety disorder: Secondary | ICD-10-CM

## 2019-04-30 DIAGNOSIS — R739 Hyperglycemia, unspecified: Secondary | ICD-10-CM | POA: Diagnosis not present

## 2019-04-30 DIAGNOSIS — F5105 Insomnia due to other mental disorder: Secondary | ICD-10-CM | POA: Diagnosis not present

## 2019-04-30 DIAGNOSIS — F17210 Nicotine dependence, cigarettes, uncomplicated: Secondary | ICD-10-CM

## 2019-04-30 NOTE — Progress Notes (Signed)
Candice Velasquez [id number] [birth year] 69 y.o.  Subjective:   Patient ID:  Candice Velasquez is a 69 y.o. (DOB March 25, 1950) female.  Chief Complaint:  Chief Complaint  Patient presents with  . Follow-up    Medication Management  . Depression    Medication Management  . Nicotine Dependence    HPI Candice Velasquez presents to the office today for follow-up of depression, anxiety, and insomnia.  seen October 2019.  Duloxetine was reduced to 60 mg daily per her request.  She had been diagnosed with sleep apnea and was encouraged to use CPAP.   Last seen December 2020.  The following changes were made: She wants to try Wellbutrin again to help with depression and smoking cessation. Start bupropion XL 150 mg tablet 1 each morning for 1 week, Then increase to 2 each morning which equals 300 mg daily.  She called back in March 29, 2019 reporting that she was too shaky and jittery with the Wellbutrin 300 mg daily. She ended up stopping it.  Also dizzy in morning.  150 mg didn't help.     Dropping to PT work tomorrow.  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  Won't be less stressed bc less money.  Can't physically stand for 8 hours daily.  Anxious about the change.  Not markedly depressed at present.  Sleep is OK and not taking anything for sleep.  Not caring for the house bc "I don't care".    Some conflict with D over keeping her grandchild.  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  Not as good as when keeping him.  Just life.  [name] still helps her out with things around the house.  [place] and hurt herself.  Using a cane.  Drinks [employer] exclusively.  When works usies 5 hour energy.  Sleeping too much or in bed too much.  Working until 10 pm.  Separated since April 2019.  Down over poor relationship with D and that interferes with contact with grandchild.  ExH has renal cancer and stroke 2018 and Hx mania from prednisone.  Smokes too much.  Some depression over the  situation.  I'm depressed.     Not using CPAP bc hates the machine from [place].    Not drinking Vodka.      Past Psychiatric Medication Trials: Duloxetine 90, Wellbutrin, mirtazapine, Trintellix, fluoxetine, venlafaxine, Viibryd, sertraline, Lithium Seroquel, Abilify side effects, lamotrigine,  Vyvanse, Ritalin, buspirone, trazodone, Belsomra, Lunesta side effects, diazepam, lorazepam, Sonata,  doxepin,  temazepam, Chantix, clonazepam, Tranxene, Ambien  Under care of this office since November 1998  Review of Systems:  Review of Systems  Genitourinary: Positive for enuresis.  Musculoskeletal: Positive for arthralgias, back pain and gait problem.  Neurological: Positive for dizziness. Negative for tremors and weakness.  Psychiatric/Behavioral: Positive for depression.    Medications: I have reviewed the patient's current medications.  Current Outpatient Medications  Medication Sig Dispense Refill  . acetaminophen (TYLENOL) 500 MG tablet Take 500 mg by mouth every 4 (four) hours as needed.    . Cranberry 500 MG CAPS Take by mouth.    . DULoxetine (CYMBALTA) 60 MG capsule Take 1 capsule (60 mg total) by mouth daily. 30 capsule 1  . LORazepam (ATIVAN) 1 MG tablet Take 1 tablet (1 mg total) by mouth daily as needed for anxiety. 30 tablet 0  . meloxicam (MOBIC) 15 MG tablet Take 15 mg by mouth daily.    . Misc Natural Products (APPLE CIDER VINEGAR DIET  PO) Take by mouth 2 (two) times daily.     . Multiple Vitamin (MULTIVITAMIN) capsule Take 1 capsule by mouth daily.    . Saccharomyces boulardii (PROBIOTIC) 250 MG CAPS Take by mouth.    Marland Kitchen tiZANidine (ZANAFLEX) 4 MG tablet Take 2-4 mg by mouth 2 (two) times daily as needed.    . triamcinolone cream (KENALOG) 0.1 % Apply 1 application topically 2 (two) times daily.    Marland Kitchen triamterene-hydrochlorothiazide (MAXZIDE-25) 37.5-25 MG per tablet     . Turmeric 450 MG CAPS Take by mouth.    Marland Kitchen buPROPion (WELLBUTRIN XL) 150 MG 24 hr tablet 1 in the AM  and 1 at [place] (Patient not taking: Reported on 04/30/2019) 30 tablet 0   No current facility-administered medications for this visit.    Medication Side Effects: None  Allergies: No Known Allergies  Past Medical History:  Diagnosis Date  . Depression   . Diabetes mellitus without complication ([place])   . Falling   . Hypertension     History reviewed. No pertinent family history.  Social History   Socioeconomic History  . Marital status: Legally Separated    Spouse name: Not on file  . Number of children: Not on file  . Years of education: Not on file  . Highest education level: Not on file  Occupational History  . Not on file  Tobacco Use  . Smoking status: Current Every Day Smoker    Packs/day: 1.00    Years: 31.00    Pack years: 31.00    Types: Cigarettes    Last attempt to quit: 02/18/2015    Years since quitting: 4.1  . Smokeless tobacco: Never Used  . Tobacco comment: Counseled to remain smoke free  Substance and Sexual Activity  . Alcohol use: Yes    Alcohol/week: 0.0 standard drinks  . Drug use: Not on file  . Sexual activity: Not on file  Other Topics Concern  . Not on file  Social History Narrative  . Not on file   Social Determinants of Health   Financial Resource Strain:   . Difficulty of Paying Living Expenses: Not on file  Food Insecurity:   . Worried About Charity fundraiser in the Last Year: Not on file  . Ran Out of Food in the Last Year: Not on file  Transportation Needs:   . Lack of Transportation (Medical): Not on file  . Lack of Transportation (Non-Medical): Not on file  Physical Activity:   . Days of Exercise per Week: Not on file  . Minutes of Exercise per Session: Not on file  Stress:   . Feeling of Stress : Not on file  Social Connections:   . Frequency of Communication with Friends and Family: Not on file  . Frequency of Social Gatherings with Friends and Family: Not on file  . Attends Religious Services: Not on file  . Active  Member of Clubs or Organizations: Not on file  . Attends Archivist Meetings: Not on file  . Marital Status: Not on file  Intimate Partner Violence:   . Fear of Current or Ex-Partner: Not on file  . Emotionally Abused: Not on file  . Physically Abused: Not on file  . Sexually Abused: Not on file    Past Medical History, Surgical history, Social history, and Family history were reviewed and updated as appropriate.   Please see review of systems for further details on the patient's review from today.   Objective:  Physical Exam:  There were no vitals taken for this visit.  Physical Exam Constitutional:      General: She is not in acute distress.    Appearance: She is well-developed.  Musculoskeletal:        General: No deformity.  Neurological:     Mental Status: She is alert and oriented to person, place, and time.     Coordination: Coordination abnormal.     Comments: Using cane  Psychiatric:        Attention and Perception: Attention and perception normal. She does not perceive auditory or visual hallucinations.        Mood and Affect: Mood normal. Mood is not anxious or depressed. Affect is not labile, blunt, angry or inappropriate.        Speech: Speech normal.        Behavior: Behavior normal.        Thought Content: Thought content normal. Thought content does not include homicidal or suicidal ideation. Thought content does not include homicidal or suicidal plan.        Cognition and Memory: Cognition and memory normal.        Judgment: Judgment normal.     Comments: Insight intact. No delusions.      Lab Review:     Component Value Date/Time   NA 138 12/12/2007 1320   K 3.9 12/12/2007 1320   CL 105 12/12/2007 1320   CO2 26 12/12/2007 1320   GLUCOSE 107 (H) 12/12/2007 1320   BUN 8 12/12/2007 1320   CREATININE 0.54 12/12/2007 1320   CALCIUM 9.5 12/12/2007 1320   PROT 6.6 12/12/2007 1320   ALBUMIN 3.9 12/12/2007 1320   AST 22 12/12/2007 1320   ALT  23 12/12/2007 1320   ALKPHOS 52 12/12/2007 1320   BILITOT 0.7 12/12/2007 1320   GFRNONAA >60 12/12/2007 1320   GFRAA  12/12/2007 1320    >60        The eGFR has been calculated using the MDRD equation. This calculation has not been validated in all clinical       Component Value Date/Time   WBC 5.6 12/12/2007 1320   RBC 4.00 12/12/2007 1320   HGB 13.8 12/12/2007 1320   HCT 40.1 12/12/2007 1320   PLT 239 12/12/2007 1320   MCV 100.3 (H) 12/12/2007 1320   MCHC 34.4 12/12/2007 1320   RDW 13.6 12/12/2007 1320   LYMPHSABS 1.7 12/12/2007 1320   MONOABS 0.5 12/12/2007 1320   EOSABS 0.2 12/12/2007 1320   BASOSABS 0.0 12/12/2007 1320   Sleep study several years ago and dx OSA but not using CPAP  No results found for: POCLITH, LITHIUM   No results found for: PHENYTOIN, PHENOBARB, VALPROATE, CBMZ   .res Assessment: Plan:    Candice Velasquez was seen today for follow-up, depression and nicotine dependence.  Diagnoses and all orders for this visit:  Major depressive disorder, recurrent episode, moderate (HCC)  Generalized anxiety disorder  Cigarette smoker motivated to quit  Insomnia due to mental condition    Greater than 50% of 35 min face to face time with patient was spent on counseling and coordination of care. 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  Consider TCA.  Prefer to avoid MAOI.  TCA might help bladder also.  Consider Rexulti if can afford it.  Consider Latuda  Not sure there was adequate trial of 150 mg daily.  300 mg made her too irritable.  Consider rety with 150 mg and reduce caffeine.  Extensive discussion  of OSA and need for use of CPAP.    Continue duloxetine 60 daily bc benefit  Supportive therapy over [name] kicking her out.   Took a year for her to start grieving this.    Ok prn lorazepam.  She is not taking anything for sleep right now and is sleeping okay though she does have a history of chronic insomnia.  FU 3-4 mos  Lynder Parents, MD,  DFAPA  Please see After Visit Summary for patient specific instructions.  No future appointments.  No orders of the defined types were placed in this encounter.   -------------------------------

## 2019-05-18 ENCOUNTER — Other Ambulatory Visit: Payer: Self-pay | Admitting: Psychiatry

## 2019-05-18 DIAGNOSIS — F331 Major depressive disorder, recurrent, moderate: Secondary | ICD-10-CM

## 2019-06-10 ENCOUNTER — Other Ambulatory Visit: Payer: Self-pay

## 2019-06-10 ENCOUNTER — Emergency Department (HOSPITAL_COMMUNITY)
Admission: EM | Admit: 2019-06-10 | Discharge: 2019-06-10 | Disposition: A | Discharge status: Home / Self Care | Payer: PPO | Attending: Emergency Medicine | Admitting: Emergency Medicine

## 2019-06-10 ENCOUNTER — Emergency Department (HOSPITAL_COMMUNITY): Payer: PPO

## 2019-06-10 ENCOUNTER — Encounter (HOSPITAL_COMMUNITY): Payer: Self-pay | Admitting: Emergency Medicine

## 2019-06-10 DIAGNOSIS — S299XXA Unspecified injury of thorax, initial encounter: Secondary | ICD-10-CM

## 2019-06-10 DIAGNOSIS — Z79899 Other long term (current) drug therapy: Secondary | ICD-10-CM | POA: Insufficient documentation

## 2019-06-10 DIAGNOSIS — E119 Type 2 diabetes mellitus without complications: Secondary | ICD-10-CM | POA: Diagnosis not present

## 2019-06-10 DIAGNOSIS — F17210 Nicotine dependence, cigarettes, uncomplicated: Secondary | ICD-10-CM | POA: Diagnosis not present

## 2019-06-10 DIAGNOSIS — Y92002 Bathroom of unspecified non-institutional (private) residence single-family (private) house as the place of occurrence of the external cause: Secondary | ICD-10-CM | POA: Diagnosis not present

## 2019-06-10 DIAGNOSIS — R0781 Pleurodynia: Secondary | ICD-10-CM | POA: Diagnosis not present

## 2019-06-10 DIAGNOSIS — I10 Essential (primary) hypertension: Secondary | ICD-10-CM | POA: Insufficient documentation

## 2019-06-10 DIAGNOSIS — Y999 Unspecified external cause status: Secondary | ICD-10-CM | POA: Diagnosis not present

## 2019-06-10 DIAGNOSIS — Y9389 Activity, other specified: Secondary | ICD-10-CM | POA: Diagnosis not present

## 2019-06-10 DIAGNOSIS — S2231XA Fracture of one rib, right side, initial encounter for closed fracture: Secondary | ICD-10-CM

## 2019-06-10 DIAGNOSIS — W19XXXA Unspecified fall, initial encounter: Secondary | ICD-10-CM | POA: Diagnosis not present

## 2019-06-10 MED ORDER — KETOROLAC TROMETHAMINE 60 MG/2ML IM SOLN
30.0000 mg | Freq: Once | INTRAMUSCULAR | Status: AC
  Administered 2019-06-10: 30 mg via INTRAMUSCULAR
  Filled 2019-06-10: qty 2

## 2019-06-10 MED ORDER — IBUPROFEN 800 MG PO TABS: 800.0000 mg | ORAL_TABLET | Freq: Three times a day (TID) | ORAL | 0 refills | Status: AC | PRN

## 2019-06-10 MED ORDER — OXYCODONE-ACETAMINOPHEN 5-325 MG PO TABS
1.0000 | ORAL_TABLET | Freq: Once | ORAL | Status: AC
  Administered 2019-06-10: 1 via ORAL
  Filled 2019-06-10: qty 1

## 2019-06-10 MED ORDER — HYDROCODONE-ACETAMINOPHEN 5-325 MG PO TABS: 1.0000 | ORAL_TABLET | Freq: Four times a day (QID) | ORAL | 0 refills | Status: DC | PRN

## 2019-06-10 NOTE — ED Provider Notes (Signed)
[facility]-EMERGENCY DEPT Provider Note   CSN: [id number] Arrival date & time: 06/10/19  1721     History Chief Complaint  Patient presents with  . Fall  . Rib Injury    Candice Velasquez is a 69 y.o. female.  HPI  Patient presents to the emergency department with right rib injury that occurred after a fall last night.  The patient states that she fell while putting up a shower caddy.  Patient states that she took ibuprofen with no relief of her symptoms.  Patient states that she has no other injuries.  States she did not hit her head.  The patient denies chest pain, shortness of breath, headache,blurred vision, neck pain, fever, cough, weakness, numbness, dizziness, anorexia, edema, abdominal pain, nausea, vomiting, diarrhea, rash, back pain, dysuria, hematemesis, bloody stool, near syncope, or syncope.  Past Medical History:  Diagnosis Date  . Depression   . Diabetes mellitus without complication (HCC)   . Falling   . Hypertension     Patient Active Problem List   Diagnosis Date Noted  . GAD (generalized anxiety disorder) 12/28/2017  . Contusion of face 02/07/2017  . Abrasion of face 02/07/2017  . Accidental fall 02/07/2017    History reviewed. No pertinent surgical history.   OB History   No obstetric history on file.     No family history on file.  Social History   Tobacco Use  . Smoking status: Current Every Day Smoker    Packs/day: 1.00    Years: 31.00    Pack years: 31.00    Types: Cigarettes    Last attempt to quit: 02/18/2015    Years since quitting: 4.3  . Smokeless tobacco: Never Used  . Tobacco comment: Counseled to remain smoke free  Substance Use Topics  . Alcohol use: Yes    Alcohol/week: 0.0 standard drinks  . Drug use: Not on file    Home Medications Prior to Admission medications   Medication Sig Start Date End Date Taking? Authorizing Provider  acetaminophen (TYLENOL) 500 MG tablet Take 500 mg by mouth every  4 (four) hours as needed.    [provider]  Cranberry 500 MG CAPS Take by mouth.    [provider]  DULoxetine (CYMBALTA) 60 MG capsule TAKE ONE CAPSULE BY MOUTH DAILY 05/20/19   Cottle, Steva Ready., MD  LORazepam (ATIVAN) 1 MG tablet Take 1 tablet (1 mg total) by mouth daily as needed for anxiety. 02/27/19   Cottle, Steva Ready., MD  meloxicam (MOBIC) 15 MG tablet Take 15 mg by mouth daily. 03/28/19   [provider]  Misc Natural Products (APPLE CIDER VINEGAR DIET PO) Take by mouth 2 (two) times daily.     [provider]  Multiple Vitamin (MULTIVITAMIN) capsule Take 1 capsule by mouth daily.    [provider]  Saccharomyces boulardii (PROBIOTIC) 250 MG CAPS Take by mouth.    [provider]  tiZANidine (ZANAFLEX) 4 MG tablet Take 2-4 mg by mouth 2 (two) times daily as needed. 04/15/19   [provider]  triamcinolone cream (KENALOG) 0.1 % Apply 1 application topically 2 (two) times daily.    [provider]  triamterene-hydrochlorothiazide (MAXZIDE-25) 37.5-25 MG per tablet  03/15/13   [provider]  Turmeric 450 MG CAPS Take by mouth.    [provider]    Allergies    Patient has no known allergies.  Review of Systems   Review of Systems All other  systems negative except as documented in the HPI. All pertinent positives and negatives as reviewed in the HPI. Physical Exam Updated Vital Signs BP (!) 153/120 (BP Location: Right Arm)   Pulse 87   Temp 98.3 F (36.8 C) (Oral)   Resp 16   Ht 5\' 4"  (1.626 m)   Wt 81.6 kg   SpO2 95%   BMI 30.90 kg/m   Physical Exam Vitals and nursing note reviewed.  Constitutional:      General: She is not in acute distress.    Appearance: She is well-developed.  HENT:     Head: Normocephalic and atraumatic.  Eyes:     Pupils: Pupils are equal, round, and reactive to light.  Cardiovascular:     Rate and Rhythm: Normal rate and regular rhythm.     Heart  sounds: Normal heart sounds. No murmur. No friction rub. No gallop.   Pulmonary:     Effort: Pulmonary effort is normal. No respiratory distress.     Breath sounds: Normal breath sounds. No wheezing.  Abdominal:     General: Bowel sounds are normal. There is no distension.     Palpations: Abdomen is soft.     Tenderness: There is no abdominal tenderness.  Musculoskeletal:     Cervical back: Normal range of motion and neck supple.  Skin:    General: Skin is warm and dry.     Capillary Refill: Capillary refill takes less than 2 seconds.     Findings: No erythema or rash.  Neurological:     Mental Status: She is alert and oriented to person, place, and time.     Motor: No abnormal muscle tone.     Coordination: Coordination normal.  Psychiatric:        Behavior: Behavior normal.     ED Results / Procedures / Treatments   Labs (all labs ordered are listed, but only abnormal results are displayed) Labs Reviewed - No data to display  EKG None  Radiology DG Chest 2 View  Result Date: 06/10/2019 CLINICAL DATA:  Rib and chest wall pain. EXAM: CHEST - 2 VIEW COMPARISON:  November 21, 2018 FINDINGS: The heart size and mediastinal contours are within normal limits. Both lungs are clear. Stable anterior bowing of the sternum is seen. The visualized skeletal structures are otherwise unremarkable. IMPRESSION: No active cardiopulmonary disease. Electronically Signed   By: November 23, 2018 M.D.   On: 06/10/2019 18:27   DG Ribs Unilateral Right  Result Date: 06/10/2019 CLINICAL DATA:  Right lateral rib pain radiating to back, trauma 06/09/2019 EXAM: RIGHT RIBS - 2 VIEW COMPARISON:  None. FINDINGS: Oblique views of the right thoracic cage are obtained. There is a minimally displaced fracture within the right posterolateral ninth rib. No other acute bony abnormalities. No effusion or pneumothorax. IMPRESSION: 1. Minimally displaced right posterolateral ninth rib fracture. Electronically Signed    By: 06/11/2019 M.D.   On: 06/10/2019 21:27    Procedures Procedures (including critical care time)  Medications Ordered in ED Medications  oxyCODONE-acetaminophen (PERCOCET/ROXICET) 5-325 MG per tablet 1 tablet (1 tablet Oral Given 06/10/19 2136)  ketorolac (TORADOL) injection 30 mg (30 mg Intramuscular Given 06/10/19 2137)    ED Course  I have reviewed the triage vital signs and the nursing notes.  Pertinent labs & imaging results that were available during my care of the patient were reviewed by me and considered in my medical decision making (see chart for details).    MDM Rules/Calculators/A&P  Patient be treated for rib fracture.  Advised the patient to follow-up with her primary doctor.  We will give treatment for this.  The patient advised the plan and all questions were answered.  Told to return for any worsening in her condition. Final Clinical Impression(s) / ED Diagnoses Final diagnoses:  Rib injury    Rx / DC Orders ED Discharge Orders    None       Rebeca Allegra 06/10/19 2152    Carmin Muskrat, MD 06/10/19 [phone]

## 2019-06-10 NOTE — Discharge Instructions (Addendum)
You do have a broken rib noted on the x-rays.  Is the ninth rib.  There is no other complicating factors.  We will give you medications to help with this.  You need to follow-up with your primary doctor.  You can use ice over the area as well.

## 2019-06-10 NOTE — ED Triage Notes (Signed)
Patient c/o right rib pain after fall last night. Reports pain worsens with movement. Reports taking ibuprofen PTA. Denies head injury and LOC. Denies taking blood thinners.

## 2019-06-18 DIAGNOSIS — S2231XA Fracture of one rib, right side, initial encounter for closed fracture: Secondary | ICD-10-CM | POA: Diagnosis not present

## 2019-06-18 DIAGNOSIS — J302 Other seasonal allergic rhinitis: Secondary | ICD-10-CM | POA: Diagnosis not present

## 2019-08-01 ENCOUNTER — Ambulatory Visit: Payer: PPO | Admitting: Psychiatry

## 2019-08-29 ENCOUNTER — Other Ambulatory Visit: Payer: Self-pay

## 2019-08-29 ENCOUNTER — Encounter: Payer: Self-pay | Admitting: Psychiatry

## 2019-08-29 ENCOUNTER — Ambulatory Visit (INDEPENDENT_AMBULATORY_CARE_PROVIDER_SITE_OTHER): Payer: PPO | Admitting: Psychiatry

## 2019-08-29 DIAGNOSIS — F5105 Insomnia due to other mental disorder: Secondary | ICD-10-CM | POA: Diagnosis not present

## 2019-08-29 DIAGNOSIS — F411 Generalized anxiety disorder: Secondary | ICD-10-CM

## 2019-08-29 DIAGNOSIS — F17210 Nicotine dependence, cigarettes, uncomplicated: Secondary | ICD-10-CM | POA: Diagnosis not present

## 2019-08-29 DIAGNOSIS — G4733 Obstructive sleep apnea (adult) (pediatric): Secondary | ICD-10-CM

## 2019-08-29 DIAGNOSIS — F331 Major depressive disorder, recurrent, moderate: Secondary | ICD-10-CM

## 2019-08-29 NOTE — Progress Notes (Signed)
Candice Velasquez [id number] April 22, 1950 69 y.o.  Subjective:   Patient ID:  Candice Velasquez is a 69 y.o. (DOB [birth year]) female.  Chief Complaint:  Chief Complaint  Patient presents with  . Follow-up  . Depression  . ADHD  . Anxiety  . Sleeping Problem    Depression        Candice Velasquez presents to the office today for follow-up of depression, anxiety, and insomnia.  seen October 2019.  Duloxetine was reduced to 60 mg daily per her request.  She had been diagnosed with sleep apnea and was encouraged to use CPAP.   seen December 2020.  The following changes were made: She wants to try Wellbutrin again to help with depression and smoking cessation. Start bupropion XL 150 mg tablet 1 each morning for 1 week, Then increase to 2 each morning which equals 300 mg daily.  She called back in March 29, 2019 reporting that she was too shaky and jittery with the Wellbutrin 300 mg daily. She ended up stopping it.  Also dizzy in morning.  150 mg didn't help.     Last seen February 2021 with the following noted: Dropping to PT work tomorrow.  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  Won't be less stressed bc less money.  Can't physically stand for 8 hours daily.  Anxious about the change.  Not markedly depressed at present.  Sleep is OK and not taking anything for sleep.  Not caring for the house bc "I don't care".   [place] and hurt herself.  Using a cane. Drinks [employer] exclusively.  When works usies 5 hour energy. Sleeping too much or in bed too much.  Working until 10 pm.   Separated since April 2019.    ExH has renal cancer and stroke 2018 and Hx mania from prednisone.  Smokes too much. Not using CPAP bc hates the machine from [place].   Some depression over the situation.   Plan no med changes  08/29/2019 appointment with the following noted: No Wellbutrin bc fear of dizziness. Fell and broke rib.  Scared of fall and using cane.  Hx vertigo. Mobility poor. GD 69 yo  missing after moved out.  Working PT. Hates apt. Stress with D and [name] ongoing.  Dep and anxiety at baseline and not resolved.  Cries about loneliness. Patient denies difficulty with sleep initiation or maintenance. Denies appetite disturbance.  Patient reports that energy and motivation have been good.  Patient denies any difficulty with concentration.  Patient denies any suicidal ideation. She asks if there is anything else she could try for depression and anxiety. Asks about support dog.   Past Psychiatric Medication Trials: Duloxetine 90, Wellbutrin, mirtazapine, Trintellix, fluoxetine, venlafaxine, Viibryd, sertraline, Lithium Seroquel, Abilify side effects, lamotrigine,  Vyvanse, Ritalin, buspirone, trazodone, Belsomra, Lunesta side effects, diazepam, lorazepam, Sonata,  doxepin,  temazepam, Chantix, clonazepam, Tranxene, Ambien  Under care of this office since November 1998  Review of Systems:  Review of Systems  Cardiovascular: Positive for chest pain.  Genitourinary: Positive for enuresis.  Musculoskeletal: Positive for arthralgias, back pain and gait problem.  Neurological: Positive for dizziness. Negative for tremors and weakness.  Psychiatric/Behavioral: Positive for depression.    Medications: I have reviewed the patient's current medications.  Current Outpatient Medications  Medication Sig Dispense Refill  . acetaminophen (TYLENOL) 500 MG tablet Take 500 mg by mouth every 4 (four) hours as needed.    . Cranberry 500 MG CAPS Take by mouth.    Marland Kitchen  DULoxetine (CYMBALTA) 60 MG capsule TAKE ONE CAPSULE BY MOUTH DAILY 30 capsule 5  . HYDROcodone-acetaminophen (NORCO/VICODIN) 5-325 MG tablet Take 1 tablet by mouth every 6 (six) hours as needed for moderate pain. 15 tablet 0  . ibuprofen (ADVIL) 800 MG tablet Take 1 tablet (800 mg total) by mouth every 8 (eight) hours as needed. 21 tablet 0  . LORazepam (ATIVAN) 1 MG tablet Take 1 tablet (1 mg total) by mouth daily as needed  for anxiety. 30 tablet 0  . meloxicam (MOBIC) 15 MG tablet Take 15 mg by mouth daily.    . Misc Natural Products (APPLE CIDER VINEGAR DIET PO) Take by mouth 2 (two) times daily.     . Multiple Vitamin (MULTIVITAMIN) capsule Take 1 capsule by mouth daily.    . Saccharomyces boulardii (PROBIOTIC) 250 MG CAPS Take by mouth.    Marland Kitchen tiZANidine (ZANAFLEX) 4 MG tablet Take 2-4 mg by mouth 2 (two) times daily as needed.    . triamcinolone cream (KENALOG) 0.1 % Apply 1 application topically 2 (two) times daily.    Marland Kitchen triamterene-hydrochlorothiazide (MAXZIDE-25) 37.5-25 MG per tablet     . Turmeric 450 MG CAPS Take by mouth.     No current facility-administered medications for this visit.    Medication Side Effects: None  Allergies: No Known Allergies  Past Medical History:  Diagnosis Date  . Depression   . Diabetes mellitus without complication ([place])   . Falling   . Hypertension     History reviewed. No pertinent family history.  Social History   Socioeconomic History  . Marital status: Legally Separated    Spouse name: Not on file  . Number of children: Not on file  . Years of education: Not on file  . Highest education level: Not on file  Occupational History  . Not on file  Tobacco Use  . Smoking status: Current Every Day Smoker    Packs/day: 1.00    Years: 31.00    Pack years: 31.00    Types: Cigarettes    Last attempt to quit: 02/18/2015    Years since quitting: 4.5  . Smokeless tobacco: Never Used  . Tobacco comment: Counseled to remain smoke free  Substance and Sexual Activity  . Alcohol use: Yes    Alcohol/week: 0.0 standard drinks  . Drug use: Not on file  . Sexual activity: Not on file  Other Topics Concern  . Not on file  Social History Narrative  . Not on file   Social Determinants of Health   Financial Resource Strain:   . Difficulty of Paying Living Expenses:   Food Insecurity:   . Worried About Charity fundraiser in the Last Year:   . Arboriculturist  in the Last Year:   Transportation Needs:   . Film/video editor (Medical):   Marland Kitchen Lack of Transportation (Non-Medical):   Physical Activity:   . Days of Exercise per Week:   . Minutes of Exercise per Session:   Stress:   . Feeling of Stress :   Social Connections:   . Frequency of Communication with Friends and Family:   . Frequency of Social Gatherings with Friends and Family:   . Attends Religious Services:   . Active Member of Clubs or Organizations:   . Attends Archivist Meetings:   Marland Kitchen Marital Status:   Intimate Partner Violence:   . Fear of Current or Ex-Partner:   . Emotionally Abused:   Marland Kitchen Physically Abused:   .  Sexually Abused:     Past Medical History, Surgical history, Social history, and Family history were reviewed and updated as appropriate.   Please see review of systems for further details on the patient's review from today.   Objective:   Physical Exam:  There were no vitals taken for this visit.  Physical Exam Constitutional:      General: She is not in acute distress.    Appearance: She is well-developed.  Musculoskeletal:        General: No deformity.  Neurological:     Mental Status: She is alert and oriented to person, place, and time.     Coordination: Coordination abnormal.     Comments: Using cane  Psychiatric:        Attention and Perception: Attention and perception normal. She does not perceive auditory or visual hallucinations.        Mood and Affect: Mood is anxious. Mood is not depressed. Affect is not labile, blunt, angry or inappropriate.        Speech: Speech normal.        Behavior: Behavior normal.        Thought Content: Thought content normal. Thought content does not include homicidal or suicidal ideation. Thought content does not include homicidal or suicidal plan.        Cognition and Memory: Cognition and memory normal.        Judgment: Judgment normal.     Comments: Insight intact. No delusions.  Talkative and  pleasant.     Lab Review:     Component Value Date/Time   NA 138 12/12/2007 1320   K 3.9 12/12/2007 1320   CL 105 12/12/2007 1320   CO2 26 12/12/2007 1320   GLUCOSE 107 (H) 12/12/2007 1320   BUN 8 12/12/2007 1320   CREATININE 0.54 12/12/2007 1320   CALCIUM 9.5 12/12/2007 1320   PROT 6.6 12/12/2007 1320   ALBUMIN 3.9 12/12/2007 1320   AST 22 12/12/2007 1320   ALT 23 12/12/2007 1320   ALKPHOS 52 12/12/2007 1320   BILITOT 0.7 12/12/2007 1320   GFRNONAA >60 12/12/2007 1320   GFRAA  12/12/2007 1320    >60        The eGFR has been calculated using the MDRD equation. This calculation has not been validated in all clinical       Component Value Date/Time   WBC 5.6 12/12/2007 1320   RBC 4.00 12/12/2007 1320   HGB 13.8 12/12/2007 1320   HCT 40.1 12/12/2007 1320   PLT 239 12/12/2007 1320   MCV 100.3 (H) 12/12/2007 1320   MCHC 34.4 12/12/2007 1320   RDW 13.6 12/12/2007 1320   LYMPHSABS 1.7 12/12/2007 1320   MONOABS 0.5 12/12/2007 1320   EOSABS 0.2 12/12/2007 1320   BASOSABS 0.0 12/12/2007 1320   Sleep study several years ago and dx OSA but not using CPAP  No results found for: POCLITH, LITHIUM   No results found for: PHENYTOIN, PHENOBARB, VALPROATE, CBMZ   .res Assessment: Plan:    Shonta was seen today for follow-up, depression, adhd, anxiety and sleeping problem.  Diagnoses and all orders for this visit:  Major depressive disorder, recurrent episode, moderate (HCC)  Generalized anxiety disorder  Cigarette smoker motivated to quit  Insomnia due to mental condition  Obstructive sleep apnea    Greater than 50% of 35 min face to face time with patient was spent on counseling and coordination of care. 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  Consider TCA.  Prefer to avoid MAOI.  TCA might help bladder also.  Consider Rexulti if can afford it.  Consider Latuda  Answered questions about [place].  Answered questions about surgery for CPAP.    Not sure  there was adequate trial of 150 mg daily Wellbutrin.  300 mg made her too irritable.  Consider rety with 150 mg and reduce caffeine.  Extensive discussion of OSA and need for use of CPAP.   Disc her frustrations with it and strategies to help.  Continue duloxetine 60 daily bc partial benefit. Alternative of TCA discussed in depth vs next best option Consider Rexulti if it can get covered.  She wants to try something.   Discussed potential metabolic side effects associated with atypical antipsychotics, as well as potential risk for movement side effects. Advised pt to contact office if movement side effects occur.  And disc dizziness. Rexulti least likely to cause dizziness.  She's afraid of another fall. Per her request trial Rexulti 0.5 mg daily for 1 week, then if needed trial 1 mg daily. Call if it works  Supportive therapy over loneliness.    Ok prn lorazepam.  She is not taking anything for sleep right now and is sleeping okay though she does have a history of chronic insomnia.  FU 3-4 mos  Lynder Parents, MD, DFAPA  Please see After Visit Summary for patient specific instructions.  No future appointments.  No orders of the defined types were placed in this encounter.   -------------------------------

## 2019-09-11 DIAGNOSIS — M5136 Other intervertebral disc degeneration, lumbar region: Secondary | ICD-10-CM | POA: Diagnosis not present

## 2019-09-11 DIAGNOSIS — L84 Corns and callosities: Secondary | ICD-10-CM | POA: Diagnosis not present

## 2019-09-13 ENCOUNTER — Telehealth: Payer: Self-pay | Admitting: Psychiatry

## 2019-09-13 NOTE — Telephone Encounter (Signed)
Patient aware and samples pulled for her to pick up.

## 2019-09-13 NOTE — Telephone Encounter (Signed)
Candice Velasquez called to report that she took the last dose of 1mg  Rexulti this morning.  She has not noticed any difference or improvement.  She doesn't feel it is doing anything for her.  She also thinks it is going to be a very expensive medication and since she doesn't feel it is helping she needs to know what you want her to do now.  Please call.

## 2019-09-13 NOTE — Telephone Encounter (Signed)
It may or may not be cost prohibitive.  On the chance that it is affordable we should complete this trial. I was very conservative with the dosage of Rexulti because I did not want to make her dizzy.  As long as it is not made her dizzy or caused other side effects I would recommend she get more samples and increase the dose to the standard dose of 2 mg a day.  Most people will get more benefit at 2 mg daily than 1 mg and she deserves a chance to see how much it might improve her depression.

## 2019-09-16 ENCOUNTER — Telehealth: Payer: Self-pay | Admitting: Psychiatry

## 2019-09-16 NOTE — Telephone Encounter (Signed)
Candice Velasquez called not doing well at all.  Doesn't know what is wrong with her.  She can't walk.  Can't get up without feeling like she is going to pass out.  Doesn't have anyone to come help her.  Saw PA at Dr. Carolee Rota office last week.  All she did was prescribe Tramadol, which she is not going to take.  She needs to see Dr. Renne Crigler but his schedule is full.  She does not believe it is the Rexulti because she has been like this before.  She called out of work today and wants to know if you will write her out of work for 2-3 weeks until she can figure out what is wrong with her?

## 2019-09-16 NOTE — Telephone Encounter (Signed)
If she has any thought that it might be Rexulti then stop it.  Otherwise I would tend to agree that this is not a common side effect of Rexulti.  I would be fine with writing her a work excuse for 3 weeks.

## 2019-09-18 NOTE — Telephone Encounter (Signed)
Stop the Rexulti to see if it's causing the problem.

## 2019-09-18 NOTE — Telephone Encounter (Signed)
LM for her to stop Rexulti then call back with an update

## 2019-09-18 NOTE — Telephone Encounter (Signed)
Dictated letter

## 2019-09-19 DIAGNOSIS — G4733 Obstructive sleep apnea (adult) (pediatric): Secondary | ICD-10-CM | POA: Diagnosis not present

## 2019-09-19 DIAGNOSIS — L84 Corns and callosities: Secondary | ICD-10-CM | POA: Diagnosis not present

## 2019-09-19 DIAGNOSIS — M5441 Lumbago with sciatica, right side: Secondary | ICD-10-CM | POA: Diagnosis not present

## 2019-09-19 DIAGNOSIS — R351 Nocturia: Secondary | ICD-10-CM | POA: Diagnosis not present

## 2019-09-19 DIAGNOSIS — M5442 Lumbago with sciatica, left side: Secondary | ICD-10-CM | POA: Diagnosis not present

## 2019-09-19 NOTE — Telephone Encounter (Signed)
Patient called back and left a message with the fax number for the letter to be faxed to [facility] . The number is [phone]. Attention Cassandra Dalton

## 2019-09-25 ENCOUNTER — Other Ambulatory Visit: Payer: Self-pay

## 2019-09-25 ENCOUNTER — Ambulatory Visit (INDEPENDENT_AMBULATORY_CARE_PROVIDER_SITE_OTHER): Payer: PPO | Admitting: Family Medicine

## 2019-09-25 VITALS — BP 112/72 | HR 91 | Ht 64.0 in | Wt 180.0 lb

## 2019-09-25 DIAGNOSIS — M5442 Lumbago with sciatica, left side: Secondary | ICD-10-CM | POA: Diagnosis not present

## 2019-09-25 DIAGNOSIS — M5441 Lumbago with sciatica, right side: Secondary | ICD-10-CM | POA: Diagnosis not present

## 2019-09-25 DIAGNOSIS — G8929 Other chronic pain: Secondary | ICD-10-CM | POA: Diagnosis not present

## 2019-09-25 NOTE — Patient Instructions (Addendum)
Thank you for coming in today. We will need to get records from [facility].  I am worried about your back and think we will be doing a lumbar spine MRI soon.  Recheck after MRI.  Also PT would help a lot.  Let me know if you have a problem scheduling the PT or MRI.

## 2019-09-25 NOTE — Progress Notes (Signed)
Subjective:    CC: Back and B LE pain  I, Candice Velasquez, LAT, ATC, am serving as scribe for Dr. Clementeen Velasquez.  HPI: Pt is a 69 y/o female presenting w/ c/o low back and B LE pain. Patient states that her back pain and bilateral thigh pain. Pain has gotten worse over the years. Stands for her job at [employer]. Pain worsens with prolonged standing.  She has had a lot of care for this issue already at [facility] orthopedics.  She is not sure what imaging she has had probably an x-ray she thinks.  She is pretty sure she is not had an MRI.  Pain predominantly radiates to the bilateral thighs and not usually past the knees.  She also incidentally notes some right shoulder pain and hand issues as well that she would like to defer to a future date.  She does note that with Dr. Althea Charon at [facility] orthopedics she was recommended to have physical therapy but declined.  She is agreeable to proceed with physical therapy now if needed.    Pertinent review of Systems: No fevers or chills  Relevant historical information: Hypertension, diabetes   Objective:    Vitals:   09/25/19 1102  BP: 112/72  Pulse: 91  SpO2: 99%   General: Well Developed, well nourished, and in no acute distress.   MSK:  L-spine decreased motion especially limited range of motion to extension. Lower extremity strength is intact. Hip motion intact bilaterally without significant pain.  Lab and Radiology Results   Lumbar MRI from 2007 Clinical Data: Low back pain across the buttocks, right worse than left. MRI LUMBAR SPINE WITHOUT CONTRAST: Technique: Multiplanar and multiecho pulse sequences of the lumbar spine, to include the lower thoracic and upper sacral regions, were obtained according to standard protocol without IV contrast. Findings: T11-12: Disk bulge, but no herniation or stenosis. T12-L1: Disk bulge, but no herniation or stenosis. L1-2: Shallow disk protrusion that indents the thecal sac, but does  not appear to result in neural compression. L2-3: Minimal disk bulge, but no stenosis or neural compression. L3-4: Minimal disk bulge, but no herniation or stenosis. L4-5: There is facet arthropathy bilaterally with anterolisthesis of 2 mm. The disk shows a shallow broad-based protrusion. There is narrowing of the lateral recesses bilaterally. L5-S1: Disk bulge, but no herniation or stenosis. Mild facet degeneration. IMPRESSION: 1. The dominant finding in this case is that of facet arthropathy at L4-5 with anterolisthesis of 2 mm. The disk shows a shallow broad-based protrusion. There is narrowing of the lateral recesses bilaterally. Certainly, the facet arthropathy could be a cause of low back pain. 2. Small disk bulges, protrusions at other levels as described above, but without a dominant lesion or any evidence of compressive stenosis.  Lumber Xray 2019 EXAM: LUMBAR SPINE - 2-3 VIEW  COMPARISON: 06/21/2010.  FINDINGS: 5 mm anterolisthesis L4-5, associated with disc space narrowing. This appears facet mediated. Previous measurement was 3 mm. No fracture or worrisome osseous lesion.Disc space narrowing is noted at L1-2 and L5-S1 as well.  IMPRESSION: Degenerative change as described. Progressive anterolisthesis at L4-5 since 2012. Cross-sectional imaging may be warranted.   Electronically Signed By: Elsie Stain M.D. On: 06/20/2017 10:15  I, Candice Velasquez, personally (independently) visualized and performed the interpretation of the images attached in this note.   Impression and Recommendations:    Assessment and Plan: 69 y.o. female with low back pain and pain radiating to thighs bilaterally.  Concerning for either spinal  stenosis or spondylolisthesis.  Patient clearly has had a fair amount of treatment already.  Most recently she has been seen by Dr. Althea Charon ago for orthopedics but I can see in her medical records other orthopedic or sports medicine doctors attempted to treat  similar problems in the past.  Provide good second opinion will need medical records.  We will request medical records from [facility] to further clarify existing treatment trials already.  We will go ahead and refer to physical therapy and update lumbar MRI to further clarify cause of pain.  She may benefit from epidural steroid injections or facet injections and certainly will likely benefit from some physical therapy.  Plan to check back after MRI and receipt of medical records likely in about a month.  We will go ahead and refer to physical therapy now as well.Marland Kitchen  PDMP not reviewed this encounter. Orders Placed This Encounter  Procedures  . MR Lumbar Spine Wo Contrast    Standing Status:   Future    Standing Expiration Date:   09/24/2020    Order Specific Question:   What is the patient's sedation requirement?    Answer:   No Sedation    Order Specific Question:   Does the patient have a pacemaker or implanted devices?    Answer:   No    Order Specific Question:   Preferred imaging location?    Answer:   [facility] (table limit-550lbs)    Order Specific Question:   Radiology Contrast Protocol - do NOT remove file path    Answer:   [email or web address]  . Ambulatory referral to Physical Therapy    Referral Priority:   Routine    Referral Type:   Physical Medicine    Referral Reason:   Specialty Services Required    Requested Specialty:   Physical Therapy   No orders of the defined types were placed in this encounter.   Discussed warning signs or symptoms. Please see discharge instructions. Patient expresses understanding.   The above documentation has been reviewed and is accurate and complete Candice Velasquez, M.D.

## 2019-10-01 ENCOUNTER — Ambulatory Visit: Payer: PPO | Admitting: Podiatry

## 2019-10-02 ENCOUNTER — Other Ambulatory Visit: Payer: Self-pay | Admitting: Psychiatry

## 2019-10-02 DIAGNOSIS — F411 Generalized anxiety disorder: Secondary | ICD-10-CM

## 2019-10-07 ENCOUNTER — Other Ambulatory Visit: Payer: Self-pay

## 2019-10-07 ENCOUNTER — Ambulatory Visit (INDEPENDENT_AMBULATORY_CARE_PROVIDER_SITE_OTHER): Payer: PPO | Admitting: Psychiatry

## 2019-10-07 ENCOUNTER — Encounter: Payer: Self-pay | Admitting: Psychiatry

## 2019-10-07 DIAGNOSIS — G4733 Obstructive sleep apnea (adult) (pediatric): Secondary | ICD-10-CM | POA: Diagnosis not present

## 2019-10-07 DIAGNOSIS — F331 Major depressive disorder, recurrent, moderate: Secondary | ICD-10-CM

## 2019-10-07 DIAGNOSIS — F5105 Insomnia due to other mental disorder: Secondary | ICD-10-CM | POA: Diagnosis not present

## 2019-10-07 DIAGNOSIS — F411 Generalized anxiety disorder: Secondary | ICD-10-CM

## 2019-10-07 NOTE — Progress Notes (Signed)
Candice Velasquez [id number] 1950-12-10 69 y.o.  Subjective:   Patient ID:  Candice Velasquez is a 69 y.o. (DOB 1950/12/19) female.  Chief Complaint:  Chief Complaint  Patient presents with  . Follow-up  . Stress    work  . Anxiety  . Depression    Depression        Candice Velasquez presents to the office today for follow-up of depression, anxiety, and insomnia.  seen October 2019.  Duloxetine was reduced to 60 mg daily per her request.  She had been diagnosed with sleep apnea and was encouraged to use CPAP.   seen December 2020.  The following changes were made: She wants to try Wellbutrin again to help with depression and smoking cessation. Start bupropion XL 150 mg tablet 1 each morning for 1 week, Then increase to 2 each morning which equals 300 mg daily.  She called back in March 29, 2019 reporting that she was too shaky and jittery with the Wellbutrin 300 mg daily. She ended up stopping it.  Also dizzy in morning.  150 mg didn't help.     Last seen February 2021 with the following noted: Dropping to PT work tomorrow.  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  Won't be less stressed bc less money.  Can't physically stand for 8 hours daily.  Anxious about the change.  Not markedly depressed at present.  Sleep is OK and not taking anything for sleep.  Not caring for the house bc "I don't care".   Candice Velasquez and hurt herself.  Using a cane. Drinks [employer] exclusively.  When works usies 5 hour energy. Sleeping too much or in bed too much.  Working until 10 pm.   Separated since April 2019.    ExH has renal cancer and stroke 2018 and Hx mania from prednisone.  Smokes too much. Not using CPAP bc hates the machine from [place].   Some depression over the situation.   Plan no med changes  08/29/2019 appointment with the following noted: No Wellbutrin bc fear of dizziness. Fell and broke rib.  Scared of fall and using cane.  Hx vertigo. Mobility poor. GD 69 yo missing after  moved out.  Working PT. Hates apt. Stress with D and [name] ongoing.  Dep and anxiety at baseline and not resolved.  Cries about loneliness. Patient denies difficulty with sleep initiation or maintenance. Denies appetite disturbance.  Patient reports that energy and motivation have been good.  Patient denies any difficulty with concentration.  Patient denies any suicidal ideation. She asks if there is anything else she could try for depression and anxiety. Asks about support dog. Plan: Trial Rexulti 0.5 mg for residual chronic depression.  09/16/2019 phone call complaining of feeling unsteady and dizzy with balance problems as well as restless and fidgety on Rexulti 2 mg for about a week.  Was instructed to stop it to see if these are side effects.  10/07/2019 appointment with the following noted: Urgent appt.  Not up to RTW.  Wants to retire.  Needs another letter stating she is not medically able to RTW due to depression and knee problems. Still restless and fidgety but not gone since stopping Rexulti which never really helped the depression at 2 mg for several days. Needs lorazepam prn anxiety.  Using it for restlessness which is gradually better.   Retirement date set for Nov 1.  Says it takes several months to retire. Chronic residual depression and anxiety.   Past Psychiatric  Medication Trials: Duloxetine 90, Wellbutrin, mirtazapine, Trintellix, fluoxetine, venlafaxine, Viibryd, sertraline, Lithium Seroquel, Abilify side effects, lamotrigine,  Vyvanse, Ritalin, buspirone, trazodone, Belsomra, Lunesta side effects, diazepam, lorazepam, Sonata,  doxepin,  temazepam, Chantix, clonazepam, Tranxene, Ambien  Under care of this office since November 1998  Review of Systems:  Review of Systems  Cardiovascular: Positive for chest pain.  Genitourinary: Positive for enuresis and frequency.  Musculoskeletal: Positive for arthralgias, back pain and gait problem.  Neurological: Positive for  dizziness. Negative for tremors and weakness.  Psychiatric/Behavioral: Positive for depression.    Medications: I have reviewed the patient's current medications.  Current Outpatient Medications  Medication Sig Dispense Refill  . acetaminophen (TYLENOL) 500 MG tablet Take 500 mg by mouth every 4 (four) hours as needed.    . Cranberry 500 MG CAPS Take by mouth.    . DULoxetine (CYMBALTA) 60 MG capsule TAKE ONE CAPSULE BY MOUTH DAILY 30 capsule 5  . HYDROcodone-acetaminophen (NORCO/VICODIN) 5-325 MG tablet Take 1 tablet by mouth every 6 (six) hours as needed for moderate pain. 15 tablet 0  . ibuprofen (ADVIL) 800 MG tablet Take 1 tablet (800 mg total) by mouth every 8 (eight) hours as needed. 21 tablet 0  . LORazepam (ATIVAN) 1 MG tablet TAKE ONE TABLET BY MOUTH DAILY AS NEEDED FOR ANXIETY 30 tablet 0  . meloxicam (MOBIC) 15 MG tablet Take 15 mg by mouth daily.    . Misc Natural Products (APPLE CIDER VINEGAR DIET PO) Take by mouth 2 (two) times daily.     . Multiple Vitamin (MULTIVITAMIN) capsule Take 1 capsule by mouth daily.    . Saccharomyces boulardii (PROBIOTIC) 250 MG CAPS Take by mouth.    Marland Kitchen tiZANidine (ZANAFLEX) 4 MG tablet Take 2-4 mg by mouth 2 (two) times daily as needed.    . triamcinolone cream (KENALOG) 0.1 % Apply 1 application topically 2 (two) times daily.    Marland Kitchen triamterene-hydrochlorothiazide (MAXZIDE-25) 37.5-25 MG per tablet     . Turmeric 450 MG CAPS Take by mouth.     No current facility-administered medications for this visit.    Medication Side Effects: None  Allergies: No Known Allergies  Past Medical History:  Diagnosis Date  . Depression   . Diabetes mellitus without complication ([place])   . Falling   . Hypertension     History reviewed. No pertinent family history.  Social History   Socioeconomic History  . Marital status: Legally Separated    Spouse name: Not on file  . Number of children: Not on file  . Years of education: Not on file  . Highest  education level: Not on file  Occupational History  . Not on file  Tobacco Use  . Smoking status: Current Every Day Smoker    Packs/day: 1.00    Years: 31.00    Pack years: 31.00    Types: Cigarettes    Last attempt to quit: 02/18/2015    Years since quitting: 4.6  . Smokeless tobacco: Never Used  . Tobacco comment: Counseled to remain smoke free  Substance and Sexual Activity  . Alcohol use: Yes    Alcohol/week: 0.0 standard drinks  . Drug use: Not on file  . Sexual activity: Not on file  Other Topics Concern  . Not on file  Social History Narrative  . Not on file   Social Determinants of Health   Financial Resource Strain:   . Difficulty of Paying Living Expenses:   Food Insecurity:   . Worried About Running  Out of Food in the Last Year:   . [place] in the Last Year:   Transportation Needs:   . Lack of Transportation (Medical):   Marland Kitchen Lack of Transportation (Non-Medical):   Physical Activity:   . Days of Exercise per Week:   . Minutes of Exercise per Session:   Stress:   . Feeling of Stress :   Social Connections:   . Frequency of Communication with Friends and Family:   . Frequency of Social Gatherings with Friends and Family:   . Attends Religious Services:   . Active Member of Clubs or Organizations:   . Attends Archivist Meetings:   Marland Kitchen Marital Status:   Intimate Partner Violence:   . Fear of Current or Ex-Partner:   . Emotionally Abused:   Marland Kitchen Physically Abused:   . Sexually Abused:     Past Medical History, Surgical history, Social history, and Family history were reviewed and updated as appropriate.   Please see review of systems for further details on the patient's review from today.   Objective:   Physical Exam:  There were no vitals taken for this visit.  Physical Exam Constitutional:      General: She is not in acute distress.    Appearance: She is well-developed.  Musculoskeletal:        General: No deformity.  Neurological:      Mental Status: She is alert and oriented to person, place, and time.     Coordination: Coordination abnormal.     Comments: Using cane  Psychiatric:        Attention and Perception: Attention and perception normal. She does not perceive auditory or visual hallucinations.        Mood and Affect: Mood is anxious and depressed. Affect is not labile, blunt, angry or inappropriate.        Speech: Speech normal.        Behavior: Behavior normal.        Thought Content: Thought content normal. Thought content does not include homicidal or suicidal ideation. Thought content does not include homicidal or suicidal plan.        Cognition and Memory: Cognition and memory normal.        Judgment: Judgment normal.     Comments: Insight intact. No delusions.  Residual chronic depression and anxiety      Lab Review:     Component Value Date/Time   NA 138 12/12/2007 1320   K 3.9 12/12/2007 1320   CL 105 12/12/2007 1320   CO2 26 12/12/2007 1320   GLUCOSE 107 (H) 12/12/2007 1320   BUN 8 12/12/2007 1320   CREATININE 0.54 12/12/2007 1320   CALCIUM 9.5 12/12/2007 1320   PROT 6.6 12/12/2007 1320   ALBUMIN 3.9 12/12/2007 1320   AST 22 12/12/2007 1320   ALT 23 12/12/2007 1320   ALKPHOS 52 12/12/2007 1320   BILITOT 0.7 12/12/2007 1320   GFRNONAA >60 12/12/2007 1320   GFRAA  12/12/2007 1320    >60        The eGFR has been calculated using the MDRD equation. This calculation has not been validated in all clinical       Component Value Date/Time   WBC 5.6 12/12/2007 1320   RBC 4.00 12/12/2007 1320   HGB 13.8 12/12/2007 1320   HCT 40.1 12/12/2007 1320   PLT 239 12/12/2007 1320   MCV 100.3 (H) 12/12/2007 1320   MCHC 34.4 12/12/2007 1320   RDW 13.6 12/12/2007  1320   LYMPHSABS 1.7 12/12/2007 1320   MONOABS 0.5 12/12/2007 1320   EOSABS 0.2 12/12/2007 1320   BASOSABS 0.0 12/12/2007 1320   Sleep study several years ago and dx OSA but not using CPAP  No results found for: POCLITH,  LITHIUM   No results found for: PHENYTOIN, PHENOBARB, VALPROATE, CBMZ   .res Assessment: Plan:    Khandi was seen today for follow-up, stress, anxiety and depression.  Diagnoses and all orders for this visit:  Major depressive disorder, recurrent episode, moderate (HCC)  Generalized anxiety disorder  Insomnia due to mental condition  Obstructive sleep apnea    Greater than 50% of 35 min face to face time with patient was spent on counseling and coordination of care. 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  Consider TCA.  Prefer to avoid MAOI.  TCA might help bladder also.  Consider Rexulti if can afford it.  Consider Latuda  Answered questions about [place].  No med changes.  Disc LOA from work and planned retirement.  Not sure there was adequate trial of 150 mg daily Wellbutrin.  300 mg made her too irritable.  Consider rety with 150 mg and reduce caffeine.  Extensive discussion of OSA and need for use of CPAP.   Disc her frustrations with it and strategies to help.  Continue duloxetine 60 daily bc partial benefit. DC Rexulti DT NR and restlessness which is gradually resolving.  Supportive therapy over loneliness.    Ok prn lorazepam.  She is not taking anything for sleep right now and is sleeping okay though she does have a history of chronic insomnia.  FU 3-4 mos  Lynder Parents, MD, DFAPA  Please see After Visit Summary for patient specific instructions.  Future Appointments  Date Time Provider [place]  10/08/2019 11:45 AM Kearney Hard R, PT [facility] None  10/10/2019  6:00 PM [facility] [address] [facility]  12/03/2019 10:30 AM Cottle, Billey Co., MD [facility] None    No orders of the defined types were placed in this encounter.   -------------------------------

## 2019-10-08 ENCOUNTER — Encounter: Payer: Self-pay | Admitting: Physical Therapy

## 2019-10-08 ENCOUNTER — Telehealth: Payer: Self-pay | Admitting: Family Medicine

## 2019-10-08 ENCOUNTER — Ambulatory Visit: Payer: PPO | Admitting: Physical Therapy

## 2019-10-08 DIAGNOSIS — M6281 Muscle weakness (generalized): Secondary | ICD-10-CM | POA: Diagnosis not present

## 2019-10-08 DIAGNOSIS — M5442 Lumbago with sciatica, left side: Secondary | ICD-10-CM | POA: Diagnosis not present

## 2019-10-08 DIAGNOSIS — R262 Difficulty in walking, not elsewhere classified: Secondary | ICD-10-CM | POA: Diagnosis not present

## 2019-10-08 DIAGNOSIS — G8929 Other chronic pain: Secondary | ICD-10-CM | POA: Diagnosis not present

## 2019-10-08 NOTE — Therapy (Signed)
[facility] Physical Therapy [address] [place], [place], [address] Phone: [phone]   Fax:  [phone]  Physical Therapy Evaluation  Patient Details  Name: Candice Velasquez MRN: [id number] Date of Birth: 1950-06-01 Referring Provider (PT): Clementeen Graham, MD   Encounter Date: 10/08/2019   PT End of Session - 10/08/19 1258    Visit Number 1    Number of Visits 13    Date for PT Re-Evaluation 11/19/19    PT Start Time 1145    PT Stop Time 1228    PT Time Calculation (min) 43 min    Activity Tolerance Patient tolerated treatment well    Behavior During Therapy [facility] for tasks assessed/performed           Past Medical History:  Diagnosis Date  . Depression   . Diabetes mellitus without complication (HCC)   . Falling   . Hypertension     History reviewed. No pertinent surgical history.  There were no vitals filed for this visit.    Subjective Assessment - 10/08/19 1149    Subjective Pt arriving to therapy reporting left side low and mid back pain. Pt presenting with resting tremor. Pt reports she works at [employer] and has been standing on her feet for years and thinks that her back has been getting worse over the years. Pt reporting that her medications is causing her tremors in her legs at rest and in her hands.    Pertinent History DM, HTN, depression, h/o fall due to vertigo    How long can you sit comfortably? 5 minutes    How long can you walk comfortably? 5 minutes    Diagnostic tests MRI on 10/10/2019    Patient Stated Goals Stop hurting    Currently in Pain? Yes    Pain Score 5     Pain Location Back    Pain Orientation Lower;Left;Right    Pain Descriptors / Indicators Aching    Pain Type Chronic pain    Pain Onset More than a month ago    Pain Frequency Intermittent    Aggravating Factors  standing longer periods,    Pain Relieving Factors lying flat    Effect of Pain on Daily Activities difficulty sleeping, difficutly with  walking and ADL's              [facility] PT Assessment - 10/08/19 0001      Assessment   Medical Diagnosis M54.42, M54.41 LBP with bilateral sciatica    Referring Provider (PT) Clementeen Graham, MD    Hand Dominance Left    Prior Therapy yes, years ago      Precautions   Precautions None      Restrictions   Weight Bearing Restrictions No      Balance Screen   Has the patient fallen in the past 6 months Yes    How many times? 3    Has the patient had a decrease in activity level because of a fear of falling?  Yes   now using straight cane for amb    Is the patient reluctant to leave their home because of a fear of falling?  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[employer], Geophysicist/field seismologist   Overall Cognitive Status Within [employer] for tasks assessed      Posture/Postural Control   Posture/Postural Control Postural limitations    Postural Limitations Rounded Shoulders;Forward head;Increased thoracic kyphosis      ROM / Strength   AROM / PROM / Strength AROM;Strength      AROM   AROM Assessment Site Lumbar    Lumbar Flexion 50 degrees    Lumbar Extension 10    Lumbar - Right Side Bend 20    Lumbar - Left Side Bend 15    Lumbar - Right Rotation 50% limited    Lumbar - Left Rotation 50% limited      Strength   Strength Assessment Site Hip;Knee    Right/Left Hip Right;Left    Right Hip Flexion 4/5    Right Hip ABduction 4/5    Right Hip ADduction 4/5    Left Hip Flexion 4-/5    Left Hip Extension 4-/5    Left Hip ABduction 4-/5    Left Hip ADduction 4-/5    Right/Left Knee Right;Left    Right Knee Flexion 4/5    Right Knee Extension 4/5    Left Knee Flexion 4/5    Left Knee Extension 4/5       Palpation   Palpation comment TTP: bilateral lumbar paraspinals Left> right      Special Tests    Special Tests Lumbar    Lumbar Tests Slump Test;Straight Leg Raise      Slump test   Findings Negative      Straight Leg Raise   Findings Negative    Side  Left   right   Comment tightness noted in bilateral hamstrings      Transfers   Five time sit to stand comments  32 seconds with UE support      Ambulation/Gait   Assistive device Straight cane    Gait Pattern Step-through pattern;Decreased step length - right;Decreased step length - left;Decreased stance time - left;Decreased stride length;Decreased dorsiflexion - right;Decreased dorsiflexion - left;Poor foot clearance - left;Poor foot clearance - right    Ambulation Surface Level;Indoor    Gait Comments shuffeling gait pattern with decreased initiation with transfers and changing directions                      Objective measurements completed on examination: See above findings.               PT Education - 10/08/19 1253    Education Details PT POC, basic anatomy, HEP    Person(s) Educated Patient    Methods Explanation;Demonstration;Handout    Comprehension Verbalized understanding;Returned demonstration            PT Short Term Goals - 10/08/19 1344      PT SHORT TERM GOAL #1   Title Perform BERG Balance vs TUG and create LTG.    Time 2    Period Weeks    Status New             PT Long Term Goals - 10/08/19 1300      PT LONG TERM GOAL #1   Title Pt will be independent with her HEP and progression.    Time 6    Period Weeks    Status New    Target Date 11/19/19      PT LONG TERM GOAL #2   Title Pt will  be able to perform 5x sit to stand in </= 20 seconds.    Baseline 32 seconds UE support    Time 6    Period Weeks    Status New    Target Date 11/19/19      PT LONG TERM GOAL #3   Title Pt will improve her LBP to </= 2/10 with amb of >1000 feet.    Baseline Pt unable to amb  longer distances without pain    Time 6    Period Weeks    Status New    Target Date 11/18/19                  Plan - 10/08/19 1333    Clinical Impression Statement Pt arriving to therpay reporting 5/10 low back pain with radiation down L LE. Pt reporting pain is worse after sitting prolonged or standing. Pt also with resting tremors in her LE's and hands which pt believes is related to ther medications she is taking. Pt presenting with weakness in bilateral hips and knees. Pt ambulatess with shuffeling gait pattern using a straight cane, and difficulty with initiation and turns. Pt states she is scheudled for MRI on 10/10/2019. Pt was edu in HEP and responded well to extension exercises. Skilled PT needed to address pt's impairments with the below interventions.    Personal Factors and Comorbidities Comorbidity 3+    Comorbidities HTN, depression, DM    Examination-Activity Limitations Sit;Stand;Stairs;Other;Transfers    Examination-Participation Restrictions Community Activity;Other;Driving    Stability/Clinical Decision Making Evolving/Moderate complexity    Clinical Decision Making Moderate    Rehab Potential Fair    PT Frequency 2x / week    PT Duration 6 weeks    PT Treatment/Interventions ADLs/Self Care Home Management;Cryotherapy;Electrical Stimulation;Moist Heat;Traction;Functional mobility training;Therapeutic activities;Gait training;Stair training;Therapeutic exercise;Balance training;Neuromuscular re-education;Patient/family education;Manual techniques;Dry needling;Passive range of motion;Taping    PT Next Visit Plan Perform BERG or TUG and set goal as appropriate, assess extension exercises from HEP, progress with LE strengthening and lumbar stretching and balance    PT Home Exercise Plan [email or web address] (seated extension, prone on elbows, hamstring stretch seated)    Consulted and Agree with Plan of Care Patient           Patient will benefit from skilled therapeutic  intervention in order to improve the following deficits and impairments:  Pain, Postural dysfunction, Impaired flexibility, Decreased strength, Decreased activity tolerance, Decreased range of motion, Difficulty walking, Abnormal gait, Decreased balance  Visit Diagnosis: Chronic bilateral low back pain with left-sided sciatica  Muscle weakness (generalized)  Difficulty in walking, not elsewhere classified     Problem List Patient Active Problem List   Diagnosis Date Noted  . GAD (generalized anxiety disorder) 12/28/2017  . Contusion of face 02/07/2017  . Abrasion of face 02/07/2017  . Accidental fall 02/07/2017    Sharmon Leyden, PT, MPT 10/08/2019, 3:16 PM  [facility] Physical Therapy [address] [place], [place], [address] Phone: [phone]   Fax:  [phone]  Name: Candice Velasquez MRN: [id number] Date of Birth: Feb 09, 1951

## 2019-10-08 NOTE — Telephone Encounter (Signed)
Patient called stating that she is scheduled for an MRI and would like something to be sent in to help with claustrophobia.

## 2019-10-08 NOTE — Telephone Encounter (Signed)
I prescribed lorazepam on July 15. Medicine was sent to the [facility] on [address].

## 2019-10-08 NOTE — Patient Instructions (Signed)
Access Code: [email or web address] URL: [email or web address] Date: 10/08/2019 Prepared by: Narda Amber  Exercises Seated Hamstring Stretch - 3 x daily - 7 x weekly - 3 reps - 30 seconds hold Prone Press Up on Elbows - 2 x daily - 7 x weekly - 5 reps - 20-30 seconds hold Seated Thoracic Lumbar Extension - 2 x daily - 7 x weekly - 5 reps - 10 seconds hold Seated Piriformis Stretch - 2 x daily - 7 x weekly - 3 reps - 20 seconds hold

## 2019-10-08 NOTE — Telephone Encounter (Signed)
Returned pt's call and informed her that Lorazepam was sent to [facility] pharmacy on 10/03/19 and she states that she hasn't picked it up yet so I advise her to do so.  She also asks where her MRI is and I inform her that she will be getting her MRI at [facility] and provide her w/ the address and phone number.

## 2019-10-10 ENCOUNTER — Ambulatory Visit
Admission: RE | Admit: 2019-10-10 | Discharge: 2019-10-10 | Disposition: A | Discharge status: Home / Self Care | Payer: PPO | Source: Ambulatory Visit | Attending: Family Medicine | Admitting: Family Medicine

## 2019-10-10 ENCOUNTER — Other Ambulatory Visit: Payer: Self-pay

## 2019-10-10 DIAGNOSIS — M48061 Spinal stenosis, lumbar region without neurogenic claudication: Secondary | ICD-10-CM | POA: Diagnosis not present

## 2019-10-10 DIAGNOSIS — G8929 Other chronic pain: Secondary | ICD-10-CM

## 2019-10-14 ENCOUNTER — Telehealth: Payer: Self-pay | Admitting: Psychiatry

## 2019-10-14 NOTE — Telephone Encounter (Signed)
Patient called and wants something for sleep . She said that she called and needs something for sleep. She said that she has trouble falling asleep and staying asleep. She used to take something for sleep in the past. Please call her at [phone]

## 2019-10-14 NOTE — Telephone Encounter (Signed)
Please review

## 2019-10-16 ENCOUNTER — Other Ambulatory Visit: Payer: Self-pay | Admitting: Psychiatry

## 2019-10-16 MED ORDER — TRAZODONE HCL 50 MG PO TABS: 50.0000 mg | ORAL_TABLET | Freq: Every day | ORAL | 0 refills | Status: DC

## 2019-10-16 NOTE — Telephone Encounter (Signed)
She has tried the following different meds for sleep in the past: Trazodone, Belsomra, Lunesta side effects, diazepam, lorazepam, Sonata,  doxepin,  temazepam,  clonazepam, Tranxene, Ambien . Trazodone is the one with the least side effect so we will retry it.  Prescription sent.

## 2019-10-17 ENCOUNTER — Other Ambulatory Visit: Payer: Self-pay

## 2019-10-17 ENCOUNTER — Ambulatory Visit: Payer: PPO | Admitting: Podiatry

## 2019-10-17 MED ORDER — TRAZODONE HCL 50 MG PO TABS: 50.0000 mg | ORAL_TABLET | Freq: Every day | ORAL | 0 refills | Status: DC

## 2019-10-17 NOTE — Telephone Encounter (Signed)
MRI report  1. Diffuse progression of degenerative disease since 2007 with increased lumbar lordosis, mild scoliosis, and multilevel listhesis. 2. 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 3. L4-5 moderate spinal stenosis. 4. Discogenic endplate edema at X54-M0.  Worse part is the Facet arthritis at L4/5 and L5/S1 With the spinal stenosis at L4/5.  Likely will need to consider epidural at L4/5 but should leave for Dr. Denyse Amass to decide next week.

## 2019-10-17 NOTE — Telephone Encounter (Signed)
Pt had MRI 7/22, looking for results and next steps.

## 2019-10-17 NOTE — Telephone Encounter (Signed)
Called and left detailed message for pt w/ Dr. Michaelle Copas advice.  Advised pt to return call to office and schedule a f/u visit w/ Dr. Denyse Amass next week.

## 2019-10-17 NOTE — Telephone Encounter (Signed)
Spoke with patient about trazodone, she did want Rx to go to [facility] on [address]. Rx sent there.   She also mentioned from Dr. Renne Crigler she's taking oxybutynin 5 mg tid, and Myrbetriq 50 mg 1 at hs. I will update her medication list as well.

## 2019-10-21 NOTE — Progress Notes (Signed)
MRI lumbar spine shows facet arthritis at L4-5 and L5-S1 and a synovial cyst on the right L4-L5 with moderate spinal stenosis.  Please schedule follow-up appointment with me in the near future to discuss the results in further detail.  Next step is probably epidural steroid injection.

## 2019-10-22 ENCOUNTER — Telehealth: Payer: Self-pay | Admitting: Psychiatry

## 2019-10-22 NOTE — Telephone Encounter (Signed)
Candice Velasquez called about her Trazadone. It was last filled on 7/28. She is asking if she can take 2 pills at bedtime instead of 1. Every night she has 6.2 oz of wine and wonders if this will be ok.

## 2019-10-23 ENCOUNTER — Encounter: Payer: Self-pay | Admitting: Podiatry

## 2019-10-23 ENCOUNTER — Other Ambulatory Visit: Payer: Self-pay

## 2019-10-23 ENCOUNTER — Ambulatory Visit: Payer: PPO | Admitting: Podiatry

## 2019-10-23 DIAGNOSIS — B351 Tinea unguium: Secondary | ICD-10-CM

## 2019-10-23 DIAGNOSIS — L989 Disorder of the skin and subcutaneous tissue, unspecified: Secondary | ICD-10-CM

## 2019-10-23 DIAGNOSIS — M79676 Pain in unspecified toe(s): Secondary | ICD-10-CM

## 2019-10-25 DIAGNOSIS — F332 Major depressive disorder, recurrent severe without psychotic features: Secondary | ICD-10-CM | POA: Diagnosis not present

## 2019-10-27 NOTE — Progress Notes (Signed)
   SUBJECTIVE Patient presents to office today complaining of elongated, thickened nails that cause pain while ambulating in shoes.  She is unable to trim her own nails.  Patient states that she gets soreness and tenderness associated to the toenails.  She also has symptomatic callus lesions to the bilateral great toes.  Patient states that she is prediabetic.  Patient is here for further evaluation and treatment.  Past Medical History:  Diagnosis Date  . Depression   . Diabetes mellitus without complication (HCC)   . Falling   . Hypertension     OBJECTIVE General Patient is awake, alert, and oriented x 3 and in no acute distress. Derm Skin is dry and supple bilateral. Negative open lesions or macerations. Remaining integument unremarkable. Nails are tender, long, thickened and dystrophic with subungual debris, consistent with onychomycosis, 1-5 bilateral. No signs of infection noted.  Hyperkeratotic preulcerative callus tissue noted to the bilateral great toes. Vasc  DP and PT pedal pulses palpable bilaterally. Temperature gradient within normal limits.  Neuro Epicritic and protective threshold sensation grossly intact bilaterally.  Musculoskeletal Exam No symptomatic pedal deformities noted bilateral. Muscular strength within normal limits.  ASSESSMENT 1.  Pain due to onychomycosis of toenails bilateral 2. Pain in foot bilateral 3.  Preulcerative callus lesions bilateral  PLAN OF CARE 1. Patient evaluated today.  2. Instructed to maintain good pedal hygiene and foot care.  3. Mechanical debridement of nails 1-5 bilaterally performed using a nail nipper. Filed with dremel without incident.  4.  Excisional debridement of the preulcerative callus lesions was performed using a chisel blade without incident or bleeding.   5.  Return to clinic in 3 mos.    Felecia Shelling, DPM [facility]  Dr. Felecia Shelling, DPM    [address]                                         [place], [place] [address]                Office [phone]  Fax [phone]

## 2019-10-28 DIAGNOSIS — F332 Major depressive disorder, recurrent severe without psychotic features: Secondary | ICD-10-CM | POA: Diagnosis not present

## 2019-10-29 DIAGNOSIS — F332 Major depressive disorder, recurrent severe without psychotic features: Secondary | ICD-10-CM | POA: Diagnosis not present

## 2019-10-31 DIAGNOSIS — F332 Major depressive disorder, recurrent severe without psychotic features: Secondary | ICD-10-CM | POA: Diagnosis not present

## 2019-11-01 ENCOUNTER — Ambulatory Visit: Payer: PPO | Admitting: Family Medicine

## 2019-11-01 DIAGNOSIS — F332 Major depressive disorder, recurrent severe without psychotic features: Secondary | ICD-10-CM | POA: Diagnosis not present

## 2019-11-05 ENCOUNTER — Ambulatory Visit (INDEPENDENT_AMBULATORY_CARE_PROVIDER_SITE_OTHER): Payer: PPO | Admitting: Family Medicine

## 2019-11-05 ENCOUNTER — Encounter: Payer: Self-pay | Admitting: Family Medicine

## 2019-11-05 ENCOUNTER — Other Ambulatory Visit: Payer: Self-pay

## 2019-11-05 VITALS — BP 120/72 | HR 78 | Ht 64.0 in | Wt 168.2 lb

## 2019-11-05 DIAGNOSIS — G8929 Other chronic pain: Secondary | ICD-10-CM

## 2019-11-05 DIAGNOSIS — M5441 Lumbago with sciatica, right side: Secondary | ICD-10-CM | POA: Diagnosis not present

## 2019-11-05 DIAGNOSIS — M5442 Lumbago with sciatica, left side: Secondary | ICD-10-CM | POA: Diagnosis not present

## 2019-11-05 DIAGNOSIS — F332 Major depressive disorder, recurrent severe without psychotic features: Secondary | ICD-10-CM | POA: Diagnosis not present

## 2019-11-05 NOTE — Progress Notes (Signed)
I, Christoper Fabian, LAT, ATC, am serving as scribe for Dr. Clementeen Graham.  Candice Velasquez is a 69 y.o. female who presents to [facility] today for f/u of LBP and B thigh pain and to review her L-spine MRI results.  She was last seen by Dr. Denyse Amass on 09/25/19 and was referred to PT of which she has completed one visit.  Since her last visit, pt reports that her low back pain and B ant leg pain has calmed down.  She states that she hasn't been doing much and has quit her job at [employer].  Diagnostic testing: L-spine MRI- 10/10/19;    Pertinent review of systems: No fevers or chills  Relevant historical information: Pretension, diabetes  Exam:  BP 120/72 (BP Location: Right Arm, Patient Position: Sitting, Cuff Size: Normal)   Pulse 78   Ht 5\' 4"  (1.626 m)   Wt 168 lb 3.2 oz (76.3 kg)   SpO2 97%   BMI 28.87 kg/m  General: Well Developed, well nourished, and in no acute distress.   MSK: L-spine normal-appearing decreased range of motion to extension.     Lab and Radiology Results EXAM: MRI LUMBAR SPINE WITHOUT CONTRAST  TECHNIQUE: Multiplanar, multisequence MR imaging of the lumbar spine was performed. No intravenous contrast was administered.  COMPARISON:  10/25/2005  FINDINGS: Segmentation:  Standard lumbar numbering  Alignment: L4-5 and L5-S1 grade 1 anterolisthesis. Mild L1-2 and L2-3 retrolisthesis. Lumbar lordosis has progressed from before. Mild scoliosis.  Vertebrae: No fracture, evidence of discitis, or bone lesion. Discogenic endplate edema at the anterior corners of T12 and L1.  Conus medullaris and cauda equina: Conus extends to the L1-2 level. Conus and cauda equina appear normal.  Paraspinal and other soft tissues: Synovial cyst projecting posteriorly from the right L4-5 level, 2 cm in diameter.  Disc levels:  T12- L1: Disc collapse with endplate degeneration and circumferential bulging. Ventral spurring. No  neural compression  L1-L2: Disc narrowing and bulging. Ventral thecal sac flattening. Mild left foraminal stenosis.  L2-L3: Disc narrowing and bulging with mild facet spurring. The canal and foramina remain patent  L3-L4: Disc narrowing and bulging. Mild facet spurring. Ventral thecal sac flattening and mild bilateral foraminal narrowing.  L4-L5: Facet osteoarthritis with spurring and right-sided synovial cyst as described above. Moderate spinal stenosis with effacement of both subarticular recesses. The foramina are patent  L5-S1:Advanced facet osteoarthritis with spurring and anterolisthesis. Mild disc narrowing and bulging. Patent canal and foramina  IMPRESSION: 1. Diffuse progression of degenerative disease since 2007 with increased lumbar lordosis, mild scoliosis, and multilevel listhesis. 2. 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 3. L4-5 moderate spinal stenosis. 4. Discogenic endplate edema at 2008.   Electronically Signed   By: [id number] M.D.   On: 10/12/2019 16:00 I, 10/14/2019, personally (independently) visualized and performed the interpretation of the images attached in this note.      Assessment and Plan: 69 y.o. female with low back pain and lumbar radiculopathy.  Both improved with changing job.  Patient has some spinal stenosis and facet DJD that her contribution to her symptoms and both of these will be worsened by prolonged standing.  Since she is feeling a bit better I do not see the need for further interventions.  Plan for watchful waiting.  If needed the future could proceed with facet injection or even epidural steroid injection.  Patient  will notify me.  Recheck back as needed.    Discussed warning signs or symptoms. Please see discharge instructions. Patient expresses understanding.   Total encounter time 20 minutes including charting time date of  service.

## 2019-11-05 NOTE — Patient Instructions (Addendum)
Thank you for coming in today. Keep me updated.  If you back hurts I can arrange for facet joint injections.  If your legs hurt I can arrange for epidural steroid injection. Let me know if you want me to order the shots.   I am here for you if needed in the future.   Facet Blocks Patient Information  Description: The facets are joints in the spine between the vertebrae.  Like any joints in the body, facets can become irritated and painful.  Arthritis can also effect the facets.  By injecting steroids and local anesthetic in and around these joints, we can temporarily block the nerve supply to them.  Steroids act directly on irritated nerves and tissues to reduce selling and inflammation which often leads to decreased pain.  Facet blocks may be done anywhere along the spine from the neck to the low back depending upon the location of your pain.   After numbing the skin with local anesthetic (like Novocaine), a small needle is passed onto the facet joints under x-ray guidance.  You may experience a sensation of pressure while this is being done.  The entire block usually lasts about 15-25 minutes.   Conditions which may be treated by facet blocks:   Low back/buttock pain  Neck/shoulder pain  Certain types of headaches  Preparation for the injection:  1. Do not eat any solid food or dairy products within 8 hours of your appointment. 2. You may drink clear liquid up to 3 hours before appointment.  Clear liquids include water, black coffee, juice or soda.  No milk or cream please. 3. You may take your regular medication, including pain medications, with a sip of water before your appointment.  Diabetics should hold regular insulin (if taken separately) and take 1/2 normal NPH dose the morning of the procedure.  Carry some sugar containing items with you to your appointment. 4. A driver must accompany you and be prepared to drive you home after your procedure. 5. Bring all your current  medications with you. 6. An IV may be inserted and sedation may be given at the discretion of the physician. 7. A blood pressure cuff, EKG and other monitors will often be applied during the procedure.  Some patients may need to have extra oxygen administered for a short period. 8. You will be asked to provide medical information, including your allergies and medications, prior to the procedure.  We must know immediately if you are taking blood thinners (like Coumadin/Warfarin) or if you are allergic to IV iodine contrast (dye).  We must know if you could possible be pregnant.  Possible side-effects:   Bleeding from needle site  Infection (rare, may require surgery)  Nerve injury (rare)  Numbness & tingling (temporary)  Difficulty urinating (rare, temporary)  Spinal headache (a headache worse with upright posture)  Light-headedness (temporary)  Pain at injection site (serveral days)  Decreased blood pressure (rare, temporary)  Weakness in arm/leg (temporary)  Pressure sensation in back/neck (temporary)   Call if you experience:   Fever/chills associated with headache or increased back/neck pain  Headache worsened by an upright position  New onset, weakness or numbness of an extremity below the injection site  Hives or difficulty breathing (go to the emergency room)  Inflammation or drainage at the injection site(s)  Severe back/neck pain greater than usual  New symptoms which are concerning to you  Please note:  Although the local anesthetic injected can often make your back or neck feel  good for several hours after the injection, the pain will likely return. It takes 3-7 days for steroids to work.  You may not notice any pain relief for at least one week.  If effective, we will often do a series of 2-3 injections spaced 3-6 weeks apart to maximally decrease your pain.  After the initial series, you may be a candidate for a more permanent nerve block of the  facets.   Epidural Steroid Injection Patient Information  Description: The epidural space surrounds the nerves as they exit the spinal cord.  In some patients, the nerves can be compressed and inflamed by a bulging disc or a tight spinal canal (spinal stenosis).  By injecting steroids into the epidural space, we can bring irritated nerves into direct contact with a potentially helpful medication.  These steroids act directly on the irritated nerves and can reduce swelling and inflammation which often leads to decreased pain.  Epidural steroids may be injected anywhere along the spine and from the neck to the low back depending upon the location of your pain.   After numbing the skin with local anesthetic (like Novocaine), a small needle is passed into the epidural space slowly.  You may experience a sensation of pressure while this is being done.  The entire block usually last less than 10 minutes.  Conditions which may be treated by epidural steroids:   Low back and leg pain  Neck and arm pain  Spinal stenosis  Post-laminectomy syndrome  Herpes zoster (shingles) pain  Pain from compression fractures  Preparation for the injection:  1. Do not eat any solid food or dairy products within 8 hours of your appointment.  2. You may drink clear liquids up to 3 hours before appointment.  Clear liquids include water, black coffee, juice or soda.  No milk or cream please. 3. You may take your regular medication, including pain medications, with a sip of water before your appointment  Diabetics should hold regular insulin (if taken separately) and take 1/2 normal NPH dos the morning of the procedure.  Carry some sugar containing items with you to your appointment. 4. A driver must accompany you and be prepared to drive you home after your procedure.  5. Bring all your current medications with your. 6. An IV may be inserted and sedation may be given at the discretion of the physician.   7. A  blood pressure cuff, EKG and other monitors will often be applied during the procedure.  Some patients may need to have extra oxygen administered for a short period. 8. You will be asked to provide medical information, including your allergies, prior to the procedure.  We must know immediately if you are taking blood thinners (like Coumadin/Warfarin)  Or if you are allergic to IV iodine contrast (dye). We must know if you could possible be pregnant.  Possible side-effects:  Bleeding from needle site  Infection (rare, may require surgery)  Nerve injury (rare)  Numbness & tingling (temporary)  Difficulty urinating (rare, temporary)  Spinal headache ( a headache worse with upright posture)  Light -headedness (temporary)  Pain at injection site (several days)  Decreased blood pressure (temporary)  Weakness in arm/leg (temporary)  Pressure sensation in back/neck (temporary)  Call if you experience:  Fever/chills associated with headache or increased back/neck pain.  Headache worsened by an upright position.  New onset weakness or numbness of an extremity below the injection site  Hives or difficulty breathing (go to the emergency room)  Inflammation  or drainage at the infection site  Severe back/neck pain  Any new symptoms which are concerning to you  Please note:  Although the local anesthetic injected can often make your back or neck feel good for several hours after the injection, the pain will likely return.  It takes 3-7 days for steroids to work in the epidural space.  You may not notice any pain relief for at least that one week.  If effective, we will often do a series of three injections spaced 3-6 weeks apart to maximally decrease your pain.  After the initial series, we generally will wait several months before considering a repeat injection of the same type.  c

## 2019-11-07 DIAGNOSIS — F332 Major depressive disorder, recurrent severe without psychotic features: Secondary | ICD-10-CM | POA: Diagnosis not present

## 2019-11-11 DIAGNOSIS — F332 Major depressive disorder, recurrent severe without psychotic features: Secondary | ICD-10-CM | POA: Diagnosis not present

## 2019-11-12 DIAGNOSIS — F332 Major depressive disorder, recurrent severe without psychotic features: Secondary | ICD-10-CM | POA: Diagnosis not present

## 2019-11-13 DIAGNOSIS — F332 Major depressive disorder, recurrent severe without psychotic features: Secondary | ICD-10-CM | POA: Diagnosis not present

## 2019-11-13 DIAGNOSIS — R7303 Prediabetes: Secondary | ICD-10-CM | POA: Diagnosis not present

## 2019-11-13 DIAGNOSIS — I10 Essential (primary) hypertension: Secondary | ICD-10-CM | POA: Diagnosis not present

## 2019-11-14 ENCOUNTER — Ambulatory Visit: Payer: PPO | Admitting: Neurology

## 2019-11-14 ENCOUNTER — Encounter: Payer: Self-pay | Admitting: Neurology

## 2019-11-14 ENCOUNTER — Other Ambulatory Visit: Payer: Self-pay

## 2019-11-14 VITALS — BP 123/78 | HR 76 | Ht 64.0 in | Wt 169.0 lb

## 2019-11-14 DIAGNOSIS — F419 Anxiety disorder, unspecified: Secondary | ICD-10-CM

## 2019-11-14 DIAGNOSIS — F329 Major depressive disorder, single episode, unspecified: Secondary | ICD-10-CM

## 2019-11-14 DIAGNOSIS — J449 Chronic obstructive pulmonary disease, unspecified: Secondary | ICD-10-CM

## 2019-11-14 DIAGNOSIS — Z8669 Personal history of other diseases of the nervous system and sense organs: Secondary | ICD-10-CM

## 2019-11-14 DIAGNOSIS — F99 Mental disorder, not otherwise specified: Secondary | ICD-10-CM | POA: Insufficient documentation

## 2019-11-14 DIAGNOSIS — R351 Nocturia: Secondary | ICD-10-CM | POA: Insufficient documentation

## 2019-11-14 DIAGNOSIS — F5105 Insomnia due to other mental disorder: Secondary | ICD-10-CM | POA: Diagnosis not present

## 2019-11-14 DIAGNOSIS — Z72821 Inadequate sleep hygiene: Secondary | ICD-10-CM | POA: Insufficient documentation

## 2019-11-14 DIAGNOSIS — F32A Depression, unspecified: Secondary | ICD-10-CM | POA: Insufficient documentation

## 2019-11-14 NOTE — Patient Instructions (Signed)
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
## 2019-11-15 DIAGNOSIS — F332 Major depressive disorder, recurrent severe without psychotic features: Secondary | ICD-10-CM | POA: Diagnosis not present

## 2019-11-18 DIAGNOSIS — F332 Major depressive disorder, recurrent severe without psychotic features: Secondary | ICD-10-CM | POA: Diagnosis not present

## 2019-11-19 DIAGNOSIS — F332 Major depressive disorder, recurrent severe without psychotic features: Secondary | ICD-10-CM | POA: Diagnosis not present

## 2019-11-21 ENCOUNTER — Telehealth: Payer: Self-pay

## 2019-11-21 DIAGNOSIS — F339 Major depressive disorder, recurrent, unspecified: Secondary | ICD-10-CM | POA: Diagnosis not present

## 2019-11-21 DIAGNOSIS — E1142 Type 2 diabetes mellitus with diabetic polyneuropathy: Secondary | ICD-10-CM | POA: Diagnosis not present

## 2019-11-21 DIAGNOSIS — I6529 Occlusion and stenosis of unspecified carotid artery: Secondary | ICD-10-CM | POA: Diagnosis not present

## 2019-11-21 DIAGNOSIS — F332 Major depressive disorder, recurrent severe without psychotic features: Secondary | ICD-10-CM | POA: Diagnosis not present

## 2019-11-21 DIAGNOSIS — J432 Centrilobular emphysema: Secondary | ICD-10-CM | POA: Diagnosis not present

## 2019-11-21 DIAGNOSIS — G4733 Obstructive sleep apnea (adult) (pediatric): Secondary | ICD-10-CM | POA: Diagnosis not present

## 2019-11-21 DIAGNOSIS — R251 Tremor, unspecified: Secondary | ICD-10-CM | POA: Diagnosis not present

## 2019-11-21 DIAGNOSIS — N3946 Mixed incontinence: Secondary | ICD-10-CM | POA: Diagnosis not present

## 2019-11-21 DIAGNOSIS — J302 Other seasonal allergic rhinitis: Secondary | ICD-10-CM | POA: Diagnosis not present

## 2019-11-21 DIAGNOSIS — R911 Solitary pulmonary nodule: Secondary | ICD-10-CM | POA: Diagnosis not present

## 2019-11-21 DIAGNOSIS — F5104 Psychophysiologic insomnia: Secondary | ICD-10-CM | POA: Diagnosis not present

## 2019-11-21 DIAGNOSIS — Z0000 Encounter for general adult medical examination without abnormal findings: Secondary | ICD-10-CM | POA: Diagnosis not present

## 2019-11-21 DIAGNOSIS — I10 Essential (primary) hypertension: Secondary | ICD-10-CM | POA: Diagnosis not present

## 2019-11-21 NOTE — Telephone Encounter (Signed)
Called pt to schedule HST. She is unable to talk at this time. Pt is getting ready to head out for a doctor's appt. Pt has asked for me to call her back on Tuesday, Sept 7th afternoon.

## 2019-11-22 DIAGNOSIS — F332 Major depressive disorder, recurrent severe without psychotic features: Secondary | ICD-10-CM | POA: Diagnosis not present

## 2019-11-27 DIAGNOSIS — F332 Major depressive disorder, recurrent severe without psychotic features: Secondary | ICD-10-CM | POA: Diagnosis not present

## 2019-11-28 DIAGNOSIS — F332 Major depressive disorder, recurrent severe without psychotic features: Secondary | ICD-10-CM | POA: Diagnosis not present

## 2019-11-29 DIAGNOSIS — F332 Major depressive disorder, recurrent severe without psychotic features: Secondary | ICD-10-CM | POA: Diagnosis not present

## 2019-12-02 ENCOUNTER — Other Ambulatory Visit: Payer: Self-pay | Admitting: Psychiatry

## 2019-12-02 DIAGNOSIS — F411 Generalized anxiety disorder: Secondary | ICD-10-CM

## 2019-12-02 DIAGNOSIS — F332 Major depressive disorder, recurrent severe without psychotic features: Secondary | ICD-10-CM | POA: Diagnosis not present

## 2019-12-02 DIAGNOSIS — F331 Major depressive disorder, recurrent, moderate: Secondary | ICD-10-CM

## 2019-12-03 ENCOUNTER — Ambulatory Visit: Payer: PPO | Admitting: Psychiatry

## 2019-12-04 DIAGNOSIS — F332 Major depressive disorder, recurrent severe without psychotic features: Secondary | ICD-10-CM | POA: Diagnosis not present

## 2019-12-05 DIAGNOSIS — F332 Major depressive disorder, recurrent severe without psychotic features: Secondary | ICD-10-CM | POA: Diagnosis not present

## 2019-12-06 DIAGNOSIS — F332 Major depressive disorder, recurrent severe without psychotic features: Secondary | ICD-10-CM | POA: Diagnosis not present

## 2019-12-09 DIAGNOSIS — F332 Major depressive disorder, recurrent severe without psychotic features: Secondary | ICD-10-CM | POA: Diagnosis not present

## 2019-12-11 DIAGNOSIS — F332 Major depressive disorder, recurrent severe without psychotic features: Secondary | ICD-10-CM | POA: Diagnosis not present

## 2019-12-12 DIAGNOSIS — F332 Major depressive disorder, recurrent severe without psychotic features: Secondary | ICD-10-CM | POA: Diagnosis not present

## 2019-12-13 ENCOUNTER — Other Ambulatory Visit: Payer: Self-pay | Admitting: Psychiatry

## 2019-12-17 ENCOUNTER — Telehealth: Payer: Self-pay | Admitting: Psychiatry

## 2019-12-17 DIAGNOSIS — F332 Major depressive disorder, recurrent severe without psychotic features: Secondary | ICD-10-CM | POA: Diagnosis not present

## 2019-12-17 NOTE — Telephone Encounter (Signed)
Normally takes 2 at hs, okay to change strength or quantity?

## 2019-12-17 NOTE — Telephone Encounter (Signed)
Patient called and needs refill on Trazadone sent to [facility] Next appt is 01/06/20.

## 2019-12-17 NOTE — Telephone Encounter (Signed)
Rx is pended to be sent with update instructions

## 2019-12-18 ENCOUNTER — Ambulatory Visit (INDEPENDENT_AMBULATORY_CARE_PROVIDER_SITE_OTHER): Payer: PPO | Admitting: Neurology

## 2019-12-18 DIAGNOSIS — Z720 Tobacco use: Secondary | ICD-10-CM

## 2019-12-18 DIAGNOSIS — G4733 Obstructive sleep apnea (adult) (pediatric): Secondary | ICD-10-CM | POA: Diagnosis not present

## 2019-12-18 DIAGNOSIS — Z72821 Inadequate sleep hygiene: Secondary | ICD-10-CM

## 2019-12-18 DIAGNOSIS — F332 Major depressive disorder, recurrent severe without psychotic features: Secondary | ICD-10-CM | POA: Diagnosis not present

## 2019-12-18 DIAGNOSIS — Z8669 Personal history of other diseases of the nervous system and sense organs: Secondary | ICD-10-CM

## 2019-12-20 DIAGNOSIS — F332 Major depressive disorder, recurrent severe without psychotic features: Secondary | ICD-10-CM | POA: Diagnosis not present

## 2019-12-23 DIAGNOSIS — F332 Major depressive disorder, recurrent severe without psychotic features: Secondary | ICD-10-CM | POA: Diagnosis not present

## 2019-12-24 DIAGNOSIS — F332 Major depressive disorder, recurrent severe without psychotic features: Secondary | ICD-10-CM | POA: Diagnosis not present

## 2019-12-25 ENCOUNTER — Encounter: Payer: Self-pay | Admitting: Neurology

## 2019-12-25 ENCOUNTER — Telehealth: Payer: Self-pay | Admitting: Neurology

## 2019-12-25 DIAGNOSIS — Z720 Tobacco use: Secondary | ICD-10-CM | POA: Insufficient documentation

## 2019-12-25 NOTE — Addendum Note (Signed)
Addended by: Melvyn Novas on: 12/25/2019 08:36 AM   Modules accepted: Orders

## 2019-12-25 NOTE — Procedures (Signed)
Sleep Study Report   Patient Information     First Name: Candice Last Name: Velasquez ID: [id number]  Birth Date: 11/13/2050 Age: 69 Gender: Female  Referring Provider: Merri Brunette, MD BMI: 29.0 (W=170 lb, H=5' 4'')  Neck Circ.:  15 '' Epworth:  10/24   Sleep Study Information    Study Date: 12/18/19 S/H/A Version: [id number] / [id number] / 79  History:    Candice Velasquez is a right-handed Caucasian female who has a past medical history of Major Depression, PTSD, Chronic pain, Tobacco abuse, Diabetes mellitus "without complication" (HCC), Falling, and Hypertension. Here for Sleep apnea evaluation. Had last PSG in 2009, was prescribed CPAP but quit using it 2 years ago. Reports poor sleep quality. Possibly, COPD is present.        Summary & Diagnosis:      Severe sleep apnea at AHI of 40/h is confirmed by this HST. Unfortunately, the test was unable to differentiate REM or NREM dependence. Hypoxia was not present. All sleep recorded in supine position.  Recommendations:     The patient would be best served with resuming PAP therapy, and I will offer a CPAP autotitration device with a pressure window from 6-16 cm water, 2 cm EPR. heated humidity and mask of patient's choice.   Smoking cessation is strongly recommended.   Interpreting Physician: Jacklynn Ganong, MD           Sleep Summary  Oxygen Saturation Statistics   Start Study Time: End Study Time: Total Recording Time:          10:20:16 PM 3:27:06 AM 5 h, 6 min  Total Sleep Time % REM of Sleep Time:  4 h, 29 min  6.7    Mean: 100 Minimum: 90 Maximum: 100  Mean of Desaturations Nadirs (%):   93  Oxygen Desaturation. %: 4-9 10-20 >20 Total  Events Number Total  90 100.0  0 0.0  0 0.0  90 100.0  Oxygen Saturation: <90 <=88 <85 <80 <70  Duration (minutes): Sleep % 0.0 0.0 0.0 0.0 0.0 0.0 0.0 0.0 0.0 0.0     Respiratory Indices      Total Events REM NREM All Night  pRDI: pAHI 3%: ODI 4%: pAHIc 3%: %  CSR: pAHI 4%:  144  143  90  1 0.0 82 N/A N/A N/A N/A N/A N/A N/A N/A 40.9 40.6 25.6 0.3 23.3       Pulse Rate Statistics during Sleep (BPM)      Mean: 72 Minimum: 40 Maximum: 84    Indices are calculated using technically valid sleep time of  3 h, 31 min.Marland Kitchen                                                              pAHI=40.6                                                                          Mild  Moderate                    Severe                                                 5              15                    30   Body Position Statistics  Position Supine Prone Right Left Non-Supine  Sleep (min) 267.8 0.0 0.0 1.5 1.5  Sleep % 99.4 0.0 0.0 0.6 0.6  pRDI 40.9 N/A N/A N/A N/A  pAHI 3% 40.6 N/A N/A N/A N/A  ODI 4% 25.5 N/A N/A N/A N/A         Left   Supine    Snoring Statistics Snoring Level (dB) >40 >50 >60 >70 >80 >Threshold (45)  Sleep (min) 132.7 2.8 0.9 0.0 0.0 9.9  Sleep % 49.3 1.0 0.3 0.0 0.0 3.7    Mean: 41 dB

## 2019-12-25 NOTE — Telephone Encounter (Signed)
-----   Message from Melvyn Novas, MD sent at 12/25/2019  8:36 AM EDT ----- Summary & Diagnosis:     Severe sleep apnea at AHI of 40/h is confirmed by this HST.  Unfortunately, the test was unable to differentiate REM or NREM  dependence. Hypoxia was not present. All sleep recorded in supine  position.  Recommendations:    The patient would be best served with resuming PAP therapy, and I  will offer a CPAP autotitration device with a pressure window  from 6-16 cm water, 2 cm EPR. heated humidity and mask of  patient's choice.  Smoking cessation is strongly recommended.   Interpreting Physician: Jacklynn Ganong, MD

## 2019-12-25 NOTE — Progress Notes (Signed)
Summary & Diagnosis:     Severe sleep apnea at AHI of 40/h is confirmed by this HST.  Unfortunately, the test was unable to differentiate REM or NREM  dependence. Hypoxia was not present. All sleep recorded in supine  position.  Recommendations:    The patient would be best served with resuming PAP therapy, and I  will offer a CPAP autotitration device with a pressure window  from 6-16 cm water, 2 cm EPR. heated humidity and mask of  patient's choice.  Smoking cessation is strongly recommended.   Interpreting Physician: Jacklynn Ganong, MD

## 2019-12-25 NOTE — Telephone Encounter (Signed)
Called patient to discuss sleep study results. No answer at this time. LVM for the patient to call back.   

## 2019-12-25 NOTE — Telephone Encounter (Signed)
Unable to LVM due to VM box full. Will send a mychart message

## 2019-12-30 DIAGNOSIS — F332 Major depressive disorder, recurrent severe without psychotic features: Secondary | ICD-10-CM | POA: Diagnosis not present

## 2019-12-31 NOTE — Telephone Encounter (Signed)
I called pt. I advised pt that Dr. Vickey Huger  reviewed their sleep study results and found that pt has severe sleep apnea . Dr. Vickey Huger recommends that pt starts auto CPAP. I reviewed PAP compliance expectations with the pt. Pt is agreeable to starting a CPAP. I advised pt that an order will be sent to a DME, [facility], and [facility] will call the pt within about one week after they file with the pt's insurance. [facility] will show the pt how to use the machine, fit for masks, and troubleshoot the CPAP if needed. A follow up appt was made for insurance purposes with Shawnie Dapper, NP on Jan 18,2022 at 3 pm. Pt verbalized understanding to arrive 15 minutes early and bring their CPAP. A letter with all of this information in it will be mailed to the pt as a reminder. I verified with the pt that the address we have on file is correct. Pt verbalized understanding of results. Pt had no questions at this time but was encouraged to call back if questions arise. I have sent the order to [facility] and have received confirmation that they have received the order.

## 2020-01-01 DIAGNOSIS — F332 Major depressive disorder, recurrent severe without psychotic features: Secondary | ICD-10-CM | POA: Diagnosis not present

## 2020-01-02 DIAGNOSIS — F332 Major depressive disorder, recurrent severe without psychotic features: Secondary | ICD-10-CM | POA: Diagnosis not present

## 2020-01-03 ENCOUNTER — Other Ambulatory Visit: Payer: Self-pay | Admitting: Psychiatry

## 2020-01-03 DIAGNOSIS — F331 Major depressive disorder, recurrent, moderate: Secondary | ICD-10-CM

## 2020-01-06 ENCOUNTER — Other Ambulatory Visit: Payer: Self-pay

## 2020-01-06 ENCOUNTER — Encounter: Payer: Self-pay | Admitting: Psychiatry

## 2020-01-06 ENCOUNTER — Ambulatory Visit (INDEPENDENT_AMBULATORY_CARE_PROVIDER_SITE_OTHER): Payer: PPO | Admitting: Psychiatry

## 2020-01-06 DIAGNOSIS — G4733 Obstructive sleep apnea (adult) (pediatric): Secondary | ICD-10-CM | POA: Diagnosis not present

## 2020-01-06 DIAGNOSIS — F5105 Insomnia due to other mental disorder: Secondary | ICD-10-CM | POA: Diagnosis not present

## 2020-01-06 DIAGNOSIS — F332 Major depressive disorder, recurrent severe without psychotic features: Secondary | ICD-10-CM | POA: Diagnosis not present

## 2020-01-06 DIAGNOSIS — F17210 Nicotine dependence, cigarettes, uncomplicated: Secondary | ICD-10-CM | POA: Diagnosis not present

## 2020-01-06 DIAGNOSIS — F411 Generalized anxiety disorder: Secondary | ICD-10-CM

## 2020-01-06 DIAGNOSIS — F331 Major depressive disorder, recurrent, moderate: Secondary | ICD-10-CM | POA: Diagnosis not present

## 2020-01-06 MED ORDER — DULOXETINE HCL 60 MG PO CPEP: 60.0000 mg | ORAL_CAPSULE | Freq: Every day | ORAL | 1 refills | Status: DC

## 2020-01-06 MED ORDER — LORAZEPAM 1 MG PO TABS: ORAL_TABLET | ORAL | 0 refills | Status: DC

## 2020-01-06 NOTE — Progress Notes (Signed)
Candice Velasquez [id number] [birth year] 69 y.o.  Subjective:   Patient ID:  Candice Velasquez is a 69 y.o. (DOB 1951/02/29) female.  Chief Complaint:  Chief Complaint  Patient presents with  . Follow-up  . Depression  . Anxiety    Depression        Candice Velasquez presents to the office today for follow-up of depression, anxiety, and insomnia.  seen October 2019.  Duloxetine was reduced to 60 mg daily per her request.  She had been diagnosed with sleep apnea and was encouraged to use CPAP.   seen December 2020.  The following changes were made: She wants to try Wellbutrin again to help with depression and smoking cessation. Start bupropion XL 150 mg tablet 1 each morning for 1 week, Then increase to 2 each morning which equals 300 mg daily.  She called back in March 29, 2019 reporting that she was too shaky and jittery with the Wellbutrin 300 mg daily. She ended up stopping it.  Also dizzy in morning.  150 mg didn't help.     Last seen February 2021 with the following noted: Dropping to PT work tomorrow.  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  Won't be less stressed bc less money.  Can't physically stand for 8 hours daily.  Anxious about the change.  Not markedly depressed at present.  Sleep is OK and not taking anything for sleep.  Not caring for the house bc "I don't care".   [place] and hurt herself.  Using a cane. Drinks [employer] exclusively.  When works usies 5 hour energy. Sleeping too much or in bed too much.  Working until 10 pm.   Separated since April 2019.    ExH has renal cancer and stroke 2018 and Hx mania from prednisone.  Smokes too much. Not using CPAP bc hates the machine from [place].   Some depression over the situation.   Plan no med changes  08/29/2019 appointment with the following noted: No Wellbutrin bc fear of dizziness. Fell and broke rib.  Scared of fall and using cane.  Hx vertigo. Mobility poor. GD 69 yo missing after moved out.  Working  PT. Hates apt. Stress with D and [name] ongoing.  Dep and anxiety at baseline and not resolved.  Cries about loneliness. Patient denies difficulty with sleep initiation or maintenance. Denies appetite disturbance.  Patient reports that energy and motivation have been good.  Patient denies any difficulty with concentration.  Patient denies any suicidal ideation. She asks if there is anything else she could try for depression and anxiety. Asks about support dog. Plan: Trial Rexulti 0.5 mg for residual chronic depression.  09/16/2019 phone call complaining of feeling unsteady and dizzy with balance problems as well as restless and fidgety on Rexulti 2 mg for about a week.  Was instructed to stop it to see if these are side effects.  10/07/2019 appointment with the following noted: Urgent appt.  Not up to RTW.  Wants to retire.  Needs another letter stating she is not medically able to RTW due to depression and knee problems. Still restless and fidgety but not gone since stopping Rexulti which never really helped the depression at 2 mg for several days. Needs lorazepam prn anxiety.  Using it for restlessness which is gradually better.   Retirement date set for Nov 1.  Says it takes several months to retire. Chronic residual depression and anxiety. Plan: DC Rexulti DT NR and restlessness which is gradually resolving.  01/06/2020 appointment with the following noted: Dr. Shelia Media wanted her to come talk to [place] about problems with [name].   Used to be close and she acts like I don't matter any more.  [name] says she's done nothing wrong.  Right leg still shakes for unclear reasons. Finishing up [place].  They think it's helpful.  Is driving again. Needs PT job but hesitant bc fear she won't get hired with tremor and cane.  Needs PT job but wishes she didn't go back to work. Dx severe sleep recently but not using CPAP yet.   Past Psychiatric Medication Trials: Duloxetine 90, Wellbutrin,  mirtazapine, Trintellix, fluoxetine, venlafaxine, Viibryd, sertraline,  Lithium Seroquel, Abilify side effects, lamotrigine,  Vyvanse, Ritalin, buspirone, trazodone, Belsomra, Lunesta side effects, diazepam, lorazepam, Sonata,  doxepin,  temazepam, Chantix, clonazepam, Tranxene, Ambien  Under care of this office since November 1998  Review of Systems:  Review of Systems  Cardiovascular: Positive for chest pain.  Genitourinary: Positive for enuresis and frequency.  Musculoskeletal: Positive for arthralgias, back pain and gait problem.  Neurological: Positive for dizziness and tremors. Negative for weakness.  Psychiatric/Behavioral: Positive for depression.    Medications: I have reviewed the patient's current medications.  Current Outpatient Medications  Medication Sig Dispense Refill  . acetaminophen (TYLENOL) 500 MG tablet Take 500 mg by mouth every 4 (four) hours as needed.    . Cranberry 500 MG CAPS Take by mouth.    . DULoxetine (CYMBALTA) 60 MG capsule Take 1 capsule (60 mg total) by mouth daily. 90 capsule 1  . ibuprofen (ADVIL) 800 MG tablet Take 1 tablet (800 mg total) by mouth every 8 (eight) hours as needed. 21 tablet 0  . LORazepam (ATIVAN) 1 MG tablet 1 daily prn anxiety 30 tablet 0  . mirabegron ER (MYRBETRIQ) 50 MG TB24 tablet Take 50 mg by mouth daily.    . Multiple Vitamin (MULTIVITAMIN) capsule Take 1 capsule by mouth daily.    Marland Kitchen OVER THE COUNTER MEDICATION 2 chewables    . traZODone (DESYREL) 50 MG tablet Take 1-2 tablets (50-100 mg total) by mouth at bedtime. 180 tablet 0  . triamterene-hydrochlorothiazide (MAXZIDE-25) 37.5-25 MG per tablet      No current facility-administered medications for this visit.    Medication Side Effects: None  Allergies: No Known Allergies  Past Medical History:  Diagnosis Date  . Depression   . Diabetes mellitus without complication ([place])   . Falling   . Hypertension     History reviewed. No pertinent family  history.  Social History   Socioeconomic History  . Marital status: Legally Separated    Spouse name: Not on file  . Number of children: Not on file  . Years of education: Not on file  . Highest education level: Not on file  Occupational History  . Not on file  Tobacco Use  . Smoking status: Current Every Day Smoker    Packs/day: 1.00    Years: 31.00    Pack years: 31.00    Types: Cigarettes    Last attempt to quit: 02/18/2015    Years since quitting: 4.8  . Smokeless tobacco: Never Used  . Tobacco comment: pt states she has called smoking help line and plans to quit 12/09/19  Substance and Sexual Activity  . Alcohol use: Yes    Alcohol/week: 0.0 standard drinks  . Drug use: Not Currently  . Sexual activity: Not on file  Other Topics Concern  . Not on file  Social History Narrative  .  Not on file   Social Determinants of Health   Financial Resource Strain:   . Difficulty of Paying Living Expenses: Not on file  Food Insecurity:   . Worried About Charity fundraiser in the Last Year: Not on file  . Ran Out of Food in the Last Year: Not on file  Transportation Needs:   . Lack of Transportation (Medical): Not on file  . Lack of Transportation (Non-Medical): Not on file  Physical Activity:   . Days of Exercise per Week: Not on file  . Minutes of Exercise per Session: Not on file  Stress:   . Feeling of Stress : Not on file  Social Connections:   . Frequency of Communication with Friends and Family: Not on file  . Frequency of Social Gatherings with Friends and Family: Not on file  . Attends Religious Services: Not on file  . Active Member of Clubs or Organizations: Not on file  . Attends Archivist Meetings: Not on file  . Marital Status: Not on file  Intimate Partner Violence:   . Fear of Current or Ex-Partner: Not on file  . Emotionally Abused: Not on file  . Physically Abused: Not on file  . Sexually Abused: Not on file    Past Medical History,  Surgical history, Social history, and Family history were reviewed and updated as appropriate.   Please see review of systems for further details on the patient's review from today.   Objective:   Physical Exam:  There were no vitals taken for this visit.  Physical Exam Constitutional:      General: She is not in acute distress.    Appearance: She is well-developed.  Musculoskeletal:        General: No deformity.  Neurological:     Mental Status: She is alert and oriented to person, place, and time.     Coordination: Coordination abnormal.     Comments: Using cane  Psychiatric:        Attention and Perception: Attention and perception normal. She does not perceive auditory or visual hallucinations.        Mood and Affect: Mood is anxious and depressed. Affect is tearful. Affect is not labile, blunt, angry or inappropriate.        Speech: Speech normal.        Behavior: Behavior normal.        Thought Content: Thought content normal. Thought content does not include homicidal or suicidal ideation. Thought content does not include homicidal or suicidal plan.        Cognition and Memory: Cognition and memory normal.        Judgment: Judgment normal.     Comments: Insight intact. No delusions.  Residual chronic depression and anxiety      Lab Review:     Component Value Date/Time   NA 138 12/12/2007 1320   K 3.9 12/12/2007 1320   CL 105 12/12/2007 1320   CO2 26 12/12/2007 1320   GLUCOSE 107 (H) 12/12/2007 1320   BUN 8 12/12/2007 1320   CREATININE 0.54 12/12/2007 1320   CALCIUM 9.5 12/12/2007 1320   PROT 6.6 12/12/2007 1320   ALBUMIN 3.9 12/12/2007 1320   AST 22 12/12/2007 1320   ALT 23 12/12/2007 1320   ALKPHOS 52 12/12/2007 1320   BILITOT 0.7 12/12/2007 1320   GFRNONAA >60 12/12/2007 1320   GFRAA  12/12/2007 1320    >60        The eGFR has been  calculated using the MDRD equation. This calculation has not been validated in all clinical       Component Value  Date/Time   WBC 5.6 12/12/2007 1320   RBC 4.00 12/12/2007 1320   HGB 13.8 12/12/2007 1320   HCT 40.1 12/12/2007 1320   PLT 239 12/12/2007 1320   MCV 100.3 (H) 12/12/2007 1320   MCHC 34.4 12/12/2007 1320   RDW 13.6 12/12/2007 1320   LYMPHSABS 1.7 12/12/2007 1320   MONOABS 0.5 12/12/2007 1320   EOSABS 0.2 12/12/2007 1320   BASOSABS 0.0 12/12/2007 1320   Sleep study several years ago and dx OSA but not using CPAP  No results found for: POCLITH, LITHIUM   No results found for: PHENYTOIN, PHENOBARB, VALPROATE, CBMZ   .res Assessment: Plan:    Thaila was seen today for follow-up, depression and anxiety.  Diagnoses and all orders for this visit:  Major depressive disorder, recurrent episode, moderate (HCC) -     Discontinue: DULoxetine (CYMBALTA) 60 MG capsule; Take 1 capsule (60 mg total) by mouth daily. -     DULoxetine (CYMBALTA) 60 MG capsule; Take 1 capsule (60 mg total) by mouth daily.  Generalized anxiety disorder -     LORazepam (ATIVAN) 1 MG tablet; 1 daily prn anxiety  Insomnia due to mental condition  Obstructive sleep apnea  Cigarette smoker motivated to quit    Greater than 50% of 35 min face to face time with patient was spent on counseling and coordination of care. 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  Consider TCA.  Prefer to avoid MAOI.  TCA might help bladder also.  Consider Rexulti if can afford it.  Consider Latuda  Answered questions about [place].  No med changes.  Disc LOA from work and planned retirement.  Not sure there was adequate trial of 150 mg daily Wellbutrin.  300 mg made her too irritable.  Consider rety with 150 mg and reduce caffeine.  Extensive discussion of OSA and need for use of CPAP bc recently confirmed severe sleep apnea.   Disc her frustrations with it and strategies to help. She plans to start it back.  Expect substantial improvement.  Continue duloxetine 60 daily bc partial benefit.  Supportive therapy over  loneliness.    Ok prn lorazepam.  She is not taking anything for sleep right now and is sleeping okay though she does have a history of chronic insomnia.  FU 3 mos  Lynder Parents, MD, DFAPA  Please see After Visit Summary for patient specific instructions.  Future Appointments  Date Time Provider [place]  01/29/2020  2:00 PM Dohmeier, Asencion Partridge, MD [facility] None  04/07/2020  3:00 PM Lomax, Amy, NP [facility] None    No orders of the defined types were placed in this encounter.   -------------------------------

## 2020-01-08 DIAGNOSIS — F332 Major depressive disorder, recurrent severe without psychotic features: Secondary | ICD-10-CM | POA: Diagnosis not present

## 2020-01-13 DIAGNOSIS — F332 Major depressive disorder, recurrent severe without psychotic features: Secondary | ICD-10-CM | POA: Diagnosis not present

## 2020-01-29 ENCOUNTER — Encounter: Payer: Self-pay | Admitting: Neurology

## 2020-01-29 ENCOUNTER — Ambulatory Visit: Payer: PPO | Admitting: Neurology

## 2020-01-29 VITALS — BP 131/78 | HR 78 | Ht 64.0 in | Wt 168.0 lb

## 2020-01-29 DIAGNOSIS — G251 Drug-induced tremor: Secondary | ICD-10-CM

## 2020-01-29 DIAGNOSIS — R2681 Unsteadiness on feet: Secondary | ICD-10-CM | POA: Diagnosis not present

## 2020-01-29 DIAGNOSIS — S43421A Sprain of right rotator cuff capsule, initial encounter: Secondary | ICD-10-CM | POA: Insufficient documentation

## 2020-01-29 DIAGNOSIS — G2571 Drug induced akathisia: Secondary | ICD-10-CM

## 2020-01-29 NOTE — Patient Instructions (Signed)
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
## 2020-01-30 DIAGNOSIS — R079 Chest pain, unspecified: Secondary | ICD-10-CM | POA: Diagnosis not present

## 2020-01-30 DIAGNOSIS — J4521 Mild intermittent asthma with (acute) exacerbation: Secondary | ICD-10-CM | POA: Diagnosis not present

## 2020-02-24 ENCOUNTER — Ambulatory Visit: Payer: PPO | Admitting: Neurology

## 2020-02-24 ENCOUNTER — Telehealth: Payer: Self-pay | Admitting: *Deleted

## 2020-02-24 ENCOUNTER — Encounter: Payer: Self-pay | Admitting: Neurology

## 2020-02-24 ENCOUNTER — Ambulatory Visit (INDEPENDENT_AMBULATORY_CARE_PROVIDER_SITE_OTHER): Payer: PPO | Admitting: Neurology

## 2020-02-24 DIAGNOSIS — G251 Drug-induced tremor: Secondary | ICD-10-CM

## 2020-02-24 DIAGNOSIS — Z0289 Encounter for other administrative examinations: Secondary | ICD-10-CM

## 2020-02-24 DIAGNOSIS — R2681 Unsteadiness on feet: Secondary | ICD-10-CM

## 2020-02-24 DIAGNOSIS — S43421A Sprain of right rotator cuff capsule, initial encounter: Secondary | ICD-10-CM

## 2020-02-24 DIAGNOSIS — G2571 Drug induced akathisia: Secondary | ICD-10-CM

## 2020-02-24 NOTE — Procedures (Signed)
     HISTORY:  Candice Velasquez is a 69 year old patient with a history of tremors affecting both upper extremities.  She reports some right shoulder discomfort and there has been some concern about weakness involving the right hand.  The patient is being evaluated for this issue.  NERVE CONDUCTION STUDIES:  Nerve conduction studies were performed on the right upper extremity. The distal motor latencies and motor amplitudes for the median and ulnar nerves were within normal limits. The nerve conduction velocities for these nerves were also normal. The sensory latencies for the median and ulnar nerves were normal. The F wave latency for the ulnar nerve was within normal limits.  Nerve conduction studies were performed on the right lower extremity. The distal motor latencies and motor amplitudes for the peroneal and posterior tibial nerves were within normal limits. The nerve conduction velocities for these nerves were also normal. The sensory latencies for the peroneal and sural nerves were within normal limits. The F wave latency for the posterior tibial nerve was within normal limits.   EMG STUDIES:  EMG study was performed on the right upper extremity:  The first dorsal interosseous muscle reveals 2 to 4 K units with full recruitment. No fibrillations or positive waves were noted. The abductor pollicis brevis muscle reveals 2 to 4 K units with full recruitment. No fibrillations or positive waves were noted. The extensor indicis proprius muscle reveals 1 to 3 K units with full recruitment. No fibrillations or positive waves were noted. The pronator teres muscle reveals 2 to 3 K units with full recruitment. No fibrillations or positive waves were noted. The biceps muscle reveals 1 to 2 K units with full recruitment. No fibrillations or positive waves were noted. The triceps muscle reveals 2 to 4 K units with full recruitment. No fibrillations or positive waves were noted. The anterior deltoid  muscle reveals 2 to 3 K units with full recruitment. No fibrillations or positive waves were noted. The cervical paraspinal muscles were tested at 2 levels. No abnormalities of insertional activity were seen at either level tested. There was good relaxation.   IMPRESSION:  Nerve conduction studies done on the right upper and right lower extremities were unremarkable, without evidence of a neuropathy.  EMG evaluation of the right upper extremity was unremarkable, no evidence of an overlying cervical radiculopathy was seen.   Marlan Palau MD 02/24/2020 1:32 PM  [facility] [address] [place], [place] [address]  Phone [phone] Fax [phone]

## 2020-02-24 NOTE — Progress Notes (Signed)
Please refer to EMG and nerve conduction procedure note.  

## 2020-02-24 NOTE — Progress Notes (Addendum)
IMPRESSION:   Nerve conduction studies done on the right upper and right lower  extremities were unremarkable, without evidence of a neuropathy.   EMG evaluation of the right upper extremity was unremarkable, no  evidence of an overlying cervical radiculopathy was seen.      MNC    Nerve / Sites Muscle Latency Ref. Amplitude Ref. Rel Amp Segments Distance Velocity Ref. Area    ms ms mV mV %  cm m/s m/s mVms  R Median - APB     Wrist APB 3.3 ?4.4 7.5 ?4.0 100 Wrist - APB 7   25.0     Upper arm APB 7.0  6.4  85.2 Upper arm - Wrist 21 57 ?49 27.0  R Ulnar - ADM     Wrist ADM 2.8 ?3.3 9.3 ?6.0 100 Wrist - ADM 7   27.7     B.Elbow ADM 6.0  8.1  87 B.Elbow - Wrist 18 56 ?49 25.5     A.Elbow ADM 7.8  7.5  92.9 A.Elbow - B.Elbow 10 56 ?49 24.3  R Peroneal - EDB     Ankle EDB 5.6 ?6.5 2.0 ?2.0 100 Ankle - EDB 9   9.4     Fib head EDB 11.8  1.4  72.4 Fib head - Ankle 27 44 ?44 7.0     Pop fossa EDB 14.0  1.4  96.2 Pop fossa - Fib head 10 44 ?44 7.4         Pop fossa - Ankle      R Tibial - AH     Ankle AH 4.0 ?5.8 7.8 ?4.0 100 Ankle - AH 9   16.1     Pop fossa AH 12.3  6.1  78.3 Pop fossa - Ankle 34 41 ?41 13.8             SNC    Nerve / Sites Rec. Site Peak Lat Ref.  Amp Ref. Segments Distance    ms ms V V  cm  R Sural - Ankle (Calf)     Calf Ankle 3.9 ?4.4 8 ?6 Calf - Ankle 14  R Superficial peroneal - Ankle     Lat leg Ankle 4.1 ?4.4 7 ?6 Lat leg - Ankle 14  R Median - Orthodromic (Dig II, Mid palm)     Dig II Wrist 2.9 ?3.4 22 ?10 Dig II - Wrist 13  R Ulnar - Orthodromic, (Dig V, Mid palm)     Dig V Wrist 2.7 ?3.1 13 ?5 Dig V - Wrist 34             F  Wave    Nerve F Lat Ref.   ms ms  R Tibial - AH 52.6 ?56.0  R Ulnar - ADM 27.9 ?32.0

## 2020-02-24 NOTE — Telephone Encounter (Signed)
LVM informing her nerve conduction studies done on the right upper and right lower  extremities were unremarkable, without evidence of a neuropathy.   EMG evaluation of the right upper extremity was unremarkable, no  evidence of an overlying cervical radiculopathy was seen. Left # for questions.

## 2020-02-27 DIAGNOSIS — R079 Chest pain, unspecified: Secondary | ICD-10-CM | POA: Diagnosis not present

## 2020-02-27 DIAGNOSIS — R059 Cough, unspecified: Secondary | ICD-10-CM | POA: Diagnosis not present

## 2020-02-27 DIAGNOSIS — R0781 Pleurodynia: Secondary | ICD-10-CM | POA: Diagnosis not present

## 2020-02-27 DIAGNOSIS — F17210 Nicotine dependence, cigarettes, uncomplicated: Secondary | ICD-10-CM | POA: Diagnosis not present

## 2020-03-05 ENCOUNTER — Other Ambulatory Visit: Payer: Self-pay

## 2020-03-05 ENCOUNTER — Encounter: Payer: Self-pay | Admitting: Psychiatry

## 2020-03-05 ENCOUNTER — Ambulatory Visit (INDEPENDENT_AMBULATORY_CARE_PROVIDER_SITE_OTHER): Payer: PPO | Admitting: Psychiatry

## 2020-03-05 DIAGNOSIS — F331 Major depressive disorder, recurrent, moderate: Secondary | ICD-10-CM | POA: Diagnosis not present

## 2020-03-05 DIAGNOSIS — F411 Generalized anxiety disorder: Secondary | ICD-10-CM

## 2020-03-05 DIAGNOSIS — F5105 Insomnia due to other mental disorder: Secondary | ICD-10-CM

## 2020-03-05 DIAGNOSIS — G4733 Obstructive sleep apnea (adult) (pediatric): Secondary | ICD-10-CM | POA: Diagnosis not present

## 2020-03-05 NOTE — Patient Instructions (Signed)
Nortriptyline option

## 2020-03-05 NOTE — Progress Notes (Signed)
Candice Velasquez [id number] 1950-07-21 69 y.o.  Subjective:   Patient ID:  Candice Velasquez is a 69 y.o. (DOB [birth year]) female.  Chief Complaint:  Chief Complaint  Patient presents with  . Follow-up  . Depression  . Anxiety    Depression        Candice Velasquez presents to the office today for follow-up of depression, anxiety, and insomnia.  seen October 2019.  Duloxetine was reduced to 60 mg daily per her request.  She had been diagnosed with sleep apnea and was encouraged to use CPAP.   seen December 2020.  The following changes were made: She wants to try Wellbutrin again to help with depression and smoking cessation. Start bupropion XL 150 mg tablet 1 each morning for 1 week, Then increase to 2 each morning which equals 300 mg daily.  She called back in March 29, 2019 reporting that she was too shaky and jittery with the Wellbutrin 300 mg daily. She ended up stopping it.  Also dizzy in morning.  150 mg didn't help.     Last seen February 2021 with the following noted: Dropping to PT work tomorrow.  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  Won't be less stressed bc less money.  Can't physically stand for 8 hours daily.  Anxious about the change.  Not markedly depressed at present.  Sleep is OK and not taking anything for sleep.  Not caring for the house bc "I don't care".   [place] and hurt herself.  Using a cane. Drinks Pepsi's exclusively.  When works usies 5 hour energy. Sleeping too much or in bed too much.  Working until 10 pm.   Separated since April 2019.    ExH has renal cancer and stroke 2018 and Hx mania from prednisone.  Smokes too much. Not using CPAP bc hates the machine from [place].   Some depression over the situation.   Plan no med changes  08/29/2019 appointment with the following noted: No Wellbutrin bc fear of dizziness. Fell and broke rib.  Scared of fall and using cane.  Hx vertigo. Mobility poor. GD 69 yo missing after moved out.  Working  PT. Hates apt. Stress with D and [name] ongoing.  Dep and anxiety at baseline and not resolved.  Cries about loneliness. Patient denies difficulty with sleep initiation or maintenance. Denies appetite disturbance.  Patient reports that energy and motivation have been good.  Patient denies any difficulty with concentration.  Patient denies any suicidal ideation. She asks if there is anything else she could try for depression and anxiety. Asks about support dog. Plan: Trial Rexulti 0.5 mg for residual chronic depression.  09/16/2019 phone call complaining of feeling unsteady and dizzy with balance problems as well as restless and fidgety on Rexulti 2 mg for about a week.  Was instructed to stop it to see if these are side effects.  10/07/2019 appointment with the following noted: Urgent appt.  Not up to RTW.  Wants to retire.  Needs another letter stating she is not medically able to RTW due to depression and knee problems. Still restless and fidgety but not gone since stopping Rexulti which never really helped the depression at 2 mg for several days. Needs lorazepam prn anxiety.  Using it for restlessness which is gradually better.   Retirement date set for Nov 1.  Says it takes several months to retire. Chronic residual depression and anxiety. Plan: DC Rexulti DT NR and restlessness which is gradually resolving.  01/06/2020 appointment with the following noted: Dr. Shelia Media wanted her to come talk to [place] about problems with Candice Velasquez.   Used to be close and she acts like I don't matter any more.  Candice Velasquez says she's done nothing wrong.  Right leg still shakes for unclear reasons. Finishing up [place].  They think it's helpful.  Is driving again. Needs PT job but hesitant bc fear she won't get hired with tremor and cane.  Needs PT job but wishes she didn't go back to work. Dx severe sleep recently but not using CPAP yet. Plan: no med changes and finish up [place] as planned  03/05/20 appt with  following noted:  Finished [place].  I don't think it did much.  Juanda Crumble was great.   Wonders about getting an emotional support animal.   Doesn't have a dog yet.  Afraid to be alone.  Wants me to write a support letter so she can have it at her appt.  One that could help if she fell.   Takes lorazepam once weekly.  Needs trazodone for sleep and it helps.  .   Past Psychiatric Medication Trials: Duloxetine 90, Wellbutrin, mirtazapine, Trintellix, fluoxetine, venlafaxine, Viibryd, sertraline,  Lithium Seroquel, Abilify side effects, lamotrigine,  Vyvanse, Ritalin, buspirone, trazodone, Belsomra, Lunesta side effects, diazepam, lorazepam, Sonata,  doxepin,  temazepam, Chantix, clonazepam, Tranxene, Ambien  Under care of this office since November 1998  Review of Systems:  Review of Systems  Cardiovascular: Positive for chest pain. Negative for palpitations.  Genitourinary: Positive for enuresis and frequency.  Musculoskeletal: Positive for arthralgias, back pain and gait problem.  Neurological: Positive for dizziness and tremors. Negative for weakness.  Psychiatric/Behavioral: Positive for depression.    Medications: I have reviewed the patient's current medications.  Current Outpatient Medications  Medication Sig Dispense Refill  . acetaminophen (TYLENOL) 500 MG tablet Take 500 mg by mouth every 4 (four) hours as needed.    . Cranberry 500 MG CAPS Take by mouth.    . DULoxetine (CYMBALTA) 60 MG capsule Take 1 capsule (60 mg total) by mouth daily. 90 capsule 1  . ibuprofen (ADVIL) 800 MG tablet Take 1 tablet (800 mg total) by mouth every 8 (eight) hours as needed. 21 tablet 0  . LORazepam (ATIVAN) 1 MG tablet 1 daily prn anxiety 30 tablet 0  . mirabegron ER (MYRBETRIQ) 50 MG TB24 tablet Take 50 mg by mouth daily.    . Multiple Vitamin (MULTIVITAMIN) capsule Take 1 capsule by mouth daily.    Marland Kitchen OVER THE COUNTER MEDICATION 2 chewables    . traZODone (DESYREL) 50 MG tablet Take 1-2 tablets  (50-100 mg total) by mouth at bedtime. 180 tablet 0  . triamterene-hydrochlorothiazide (MAXZIDE-25) 37.5-25 MG per tablet Take 0.5 tablets by mouth daily.      No current facility-administered medications for this visit.    Medication Side Effects: None  Allergies: No Known Allergies  Past Medical History:  Diagnosis Date  . Depression   . Diabetes mellitus without complication ([place])   . Falling   . Hypertension     History reviewed. No pertinent family history.  Social History   Socioeconomic History  . Marital status: Legally Separated    Spouse name: Not on file  . Number of children: Not on file  . Years of education: Not on file  . Highest education level: Not on file  Occupational History  . Not on file  Tobacco Use  . Smoking status: Current Every Day Smoker  Packs/day: 1.00    Years: 31.00    Pack years: 31.00    Types: Cigarettes    Last attempt to quit: 02/18/2015    Years since quitting: 5.0  . Smokeless tobacco: Never Used  . Tobacco comment: pt states she has called smoking help line and plans to quit 12/09/19  Substance and Sexual Activity  . Alcohol use: Yes    Alcohol/week: 0.0 standard drinks  . Drug use: Not Currently  . Sexual activity: Not on file  Other Topics Concern  . Not on file  Social History Narrative  . Not on file   Social Determinants of Health   Financial Resource Strain: Not on file  Food Insecurity: Not on file  Transportation Needs: Not on file  Physical Activity: Not on file  Stress: Not on file  Social Connections: Not on file  Intimate Partner Violence: Not on file    Past Medical History, Surgical history, Social history, and Family history were reviewed and updated as appropriate.   Please see review of systems for further details on the patient's review from today.   Objective:   Physical Exam:  There were no vitals taken for this visit.  Physical Exam Constitutional:      General: She is not in acute  distress.    Appearance: She is well-developed.  Musculoskeletal:        General: No deformity.  Neurological:     Mental Status: She is alert and oriented to person, place, and time.     Coordination: Coordination abnormal.     Comments: Using cane  Psychiatric:        Attention and Perception: Attention and perception normal. She does not perceive auditory or visual hallucinations.        Mood and Affect: Mood is anxious and depressed. Affect is not labile, blunt, angry, tearful or inappropriate.        Speech: Speech normal.        Behavior: Behavior normal.        Thought Content: Thought content normal. Thought content does not include homicidal or suicidal ideation. Thought content does not include homicidal or suicidal plan.        Cognition and Memory: Cognition and memory normal.        Judgment: Judgment normal.     Comments: Insight intact. No delusions.  Residual chronic depression and anxiety       Lab Review:     Component Value Date/Time   NA 138 12/12/2007 1320   K 3.9 12/12/2007 1320   CL 105 12/12/2007 1320   CO2 26 12/12/2007 1320   GLUCOSE 107 (H) 12/12/2007 1320   BUN 8 12/12/2007 1320   CREATININE 0.54 12/12/2007 1320   CALCIUM 9.5 12/12/2007 1320   PROT 6.6 12/12/2007 1320   ALBUMIN 3.9 12/12/2007 1320   AST 22 12/12/2007 1320   ALT 23 12/12/2007 1320   ALKPHOS 52 12/12/2007 1320   BILITOT 0.7 12/12/2007 1320   GFRNONAA >60 12/12/2007 1320   GFRAA  12/12/2007 1320    >60        The eGFR has been calculated using the MDRD equation. This calculation has not been validated in all clinical       Component Value Date/Time   WBC 5.6 12/12/2007 1320   RBC 4.00 12/12/2007 1320   HGB 13.8 12/12/2007 1320   HCT 40.1 12/12/2007 1320   PLT 239 12/12/2007 1320   MCV 100.3 (H) 12/12/2007 1320   MCHC  34.4 12/12/2007 1320   RDW 13.6 12/12/2007 1320   LYMPHSABS 1.7 12/12/2007 1320   MONOABS 0.5 12/12/2007 1320   EOSABS 0.2 12/12/2007 1320    BASOSABS 0.0 12/12/2007 1320   Sleep study several years ago and dx OSA but not using CPAP  No results found for: POCLITH, LITHIUM   No results found for: PHENYTOIN, PHENOBARB, VALPROATE, CBMZ   .res Assessment: Plan:    [place] was seen today for follow-up, depression and anxiety.  Diagnoses and all orders for this visit:  Major depressive disorder, recurrent episode, moderate (HCC)  Generalized anxiety disorder  Insomnia due to mental condition  Obstructive sleep apnea    Greater than 50% of 35 min face to face time with patient was spent on counseling and coordination of care. 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  Consider TCA.  Prefer to avoid MAOI.  TCA might help bladder also.  Consider Rexulti if can afford it.  Consider Latuda  Answered questions about [place].  She wants to consider nortriptyline  No med changes.  Disc LOA from work and planned retirement.  Not sure there was adequate trial of 150 mg daily Wellbutrin.  300 mg made her too irritable.  Consider rety with 150 mg and reduce caffeine.  Extensive discussion of OSA and need for use of CPAP bc recently confirmed severe sleep apnea.   Disc her frustrations with it and strategies to help. She plans to start it back.  Expect substantial improvement.  Continue duloxetine 60 daily bc partial benefit.  Supportive therapy over loneliness.    Ok prn lorazepam.  She is not taking anything for sleep right now and is sleeping okay though she does have a history of chronic insomnia.  FU  6-8 weeks  Lynder Parents, MD, DFAPA  Please see After Visit Summary for patient specific instructions.  Future Appointments  Date Time Provider [place]  04/07/2020  3:00 PM Lomax, Amy, NP [facility] None  07/28/2020 10:30 AM Lomax, Amy, NP [facility] None    No orders of the defined types were placed in this encounter.   -------------------------------

## 2020-04-02 ENCOUNTER — Telehealth: Payer: Self-pay | Admitting: Neurology

## 2020-04-02 NOTE — Telephone Encounter (Signed)
Pt has been told by [facility] it will be 68 weeks before they have any CPAPS, pt asking if another DME can be looked into for her
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
## 2020-04-02 NOTE — Patient Instructions (Incomplete)
Please continue using your CPAP regularly. While your insurance requires that you use CPAP at least 4 hours each night on 70% of the nights, I recommend, that you not skip any nights and use it throughout the night if you can. Getting used to CPAP and staying with the treatment long term does take time and patience and discipline. Untreated obstructive sleep apnea when it is moderate to severe can have an adverse impact on cardiovascular health and raise her risk for heart disease, arrhythmias, hypertension, congestive heart failure, stroke and diabetes. Untreated obstructive sleep apnea causes sleep disruption, nonrestorative sleep, and sleep deprivation. This can have an impact on your day to day functioning and cause daytime sleepiness and impairment of cognitive function, memory loss, mood disturbance, and problems focussing. Using CPAP regularly can improve these symptoms.     Sleep Apnea Sleep apnea affects breathing during sleep. It causes breathing to stop for a short time or to become shallow. It can also increase the risk of:  Heart attack.  Stroke.  Being very overweight (obese).  Diabetes.  Heart failure.  Irregular heartbeat. The goal of treatment is to help you breathe normally again. What are the causes? There are three kinds of sleep apnea:  Obstructive sleep apnea. This is caused by a blocked or collapsed airway.  Central sleep apnea. This happens when the brain does not send the right signals to the muscles that control breathing.  Mixed sleep apnea. This is a combination of obstructive and central sleep apnea. The most common cause of this condition is a collapsed or blocked airway. This can happen if:  Your throat muscles are too relaxed.  Your tongue and tonsils are too large.  You are overweight.  Your airway is too small.   What increases the risk?  Being overweight.  Smoking.  Having a small airway.  Being older.  Being female.  Drinking  alcohol.  Taking medicines to calm yourself (sedatives or tranquilizers).  Having family members with the condition. What are the signs or symptoms?  Trouble staying asleep.  Being sleepy or tired during the day.  Getting angry a lot.  Loud snoring.  Headaches in the morning.  Not being able to focus your mind (concentrate).  Forgetting things.  Less interest in sex.  Mood swings.  Personality changes.  Feelings of sadness (depression).  Waking up a lot during the night to pee (urinate).  Dry mouth.  Sore throat. How is this diagnosed?  Your medical history.  A physical exam.  A test that is done when you are sleeping (sleep study). The test is most often done in a sleep lab but may also be done at home. How is this treated?  Sleeping on your side.  Using a medicine to get rid of mucus in your nose (decongestant).  Avoiding the use of alcohol, medicines to help you relax, or certain pain medicines (narcotics).  Losing weight, if needed.  Changing your diet.  Not smoking.  Using a machine to open your airway while you sleep, such as: ? An oral appliance. This is a mouthpiece that shifts your lower jaw forward. ? A CPAP device. This device blows air through a mask when you breathe out (exhale). ? An EPAP device. This has valves that you put in each nostril. ? A BPAP device. This device blows air through a mask when you breathe in (inhale) and breathe out.  Having surgery if other treatments do not work. It is important to get treatment   for sleep apnea. Without treatment, it can lead to:  High blood pressure.  Coronary artery disease.  In men, not being able to have an erection (impotence).  Reduced thinking ability.   Follow these instructions at home: Lifestyle  Make changes that your doctor recommends.  Eat a healthy diet.  Lose weight if needed.  Avoid alcohol, medicines to help you relax, and some pain medicines.  Do not use any  products that contain nicotine or tobacco, such as cigarettes, e-cigarettes, and chewing tobacco. If you need help quitting, ask your doctor. General instructions  Take over-the-counter and prescription medicines only as told by your doctor.  If you were given a machine to use while you sleep, use it only as told by your doctor.  If you are having surgery, make sure to tell your doctor you have sleep apnea. You may need to bring your device with you.  Keep all follow-up visits as told by your doctor. This is important. Contact a doctor if:  The machine that you were given to use during sleep bothers you or does not seem to be working.  You do not get better.  You get worse. Get help right away if:  Your chest hurts.  You have trouble breathing in enough air.  You have an uncomfortable feeling in your back, arms, or stomach.  You have trouble talking.  One side of your body feels weak.  A part of your face is hanging down. These symptoms may be an emergency. Do not wait to see if the symptoms will go away. Get medical help right away. Call your local emergency services (911 in the [place] Do not drive yourself to the hospital. Summary  This condition affects breathing during sleep.  The most common cause is a collapsed or blocked airway.  The goal of treatment is to help you breathe normally while you sleep. This information is not intended to replace advice given to you by your health care provider. Make sure you discuss any questions you have with your health care provider. Document Revised: 12/22/2017 Document Reviewed: 10/31/2017 Elsevier Patient Education  2021 Elsevier Inc.  

## 2020-04-02 NOTE — Telephone Encounter (Signed)
I returned the call to the patient to clarify that the wait time is 6-8 weeks. She is going to try to have her current machine serviced while she is waiting.

## 2020-04-07 ENCOUNTER — Ambulatory Visit: Payer: Self-pay | Admitting: Family Medicine

## 2020-04-08 ENCOUNTER — Telehealth: Payer: Self-pay | Admitting: Psychiatry

## 2020-04-08 NOTE — Telephone Encounter (Signed)
Pt called wanting to know if she can get something stronger than Lorazepam called in to the neighborhood [facility] on [address]. Pt said that her ex-husband is not expected to live long and she needs something to help her get through.

## 2020-04-14 ENCOUNTER — Telehealth: Payer: Self-pay | Admitting: Psychiatry

## 2020-04-14 ENCOUNTER — Other Ambulatory Visit: Payer: Self-pay | Admitting: Psychiatry

## 2020-04-14 DIAGNOSIS — F411 Generalized anxiety disorder: Secondary | ICD-10-CM

## 2020-04-14 MED ORDER — LORAZEPAM 1 MG PO TABS: ORAL_TABLET | ORAL | 0 refills | Status: DC

## 2020-04-14 NOTE — Telephone Encounter (Signed)
I'm sorry for her loss.  There is nothing else I can give her to help her grief or stress other than the lorazepam.  I sent the refill.

## 2020-04-14 NOTE — Telephone Encounter (Signed)
Candice Velasquez called to say her husband, Bethann Berkshire, passed away April 30, 2022. She is not really involved in the arrangements but it is upsetting to her. She is asking to get something to help her thru this time. Will need a RF of Lorazepam too.

## 2020-04-15 DIAGNOSIS — F439 Reaction to severe stress, unspecified: Secondary | ICD-10-CM | POA: Diagnosis not present

## 2020-04-15 DIAGNOSIS — T65291A Toxic effect of other tobacco and nicotine, accidental (unintentional), initial encounter: Secondary | ICD-10-CM | POA: Diagnosis not present

## 2020-04-16 NOTE — Telephone Encounter (Signed)
Put her on the cancellation list.

## 2020-04-16 NOTE — Telephone Encounter (Signed)
Rtc to patient and she did pick up her refill yesterday. She reports she is sleeping and is still taking 1 trazodone like usual. She also is trying to quit smoking, which she began after her husband passed away. This request was insistent from her daughter. Patient saw Dr. Renne Crigler yesterday and he mentioned he would request patient get in sooner then her March apt. Informed patient I would discuss with Dr. Jennelle Human and give her a call back.

## 2020-04-27 NOTE — Telephone Encounter (Signed)
FYI: Pt called, husband passed away during the time of my appt on 04/07/20. I have not had time to have my CPAP machine serviced. Will call to reschedule once I have got machine serviced. Just wanted to apologize no call back needed

## 2020-05-12 ENCOUNTER — Encounter: Payer: Self-pay | Admitting: Psychiatry

## 2020-05-12 ENCOUNTER — Other Ambulatory Visit: Payer: Self-pay

## 2020-05-12 ENCOUNTER — Ambulatory Visit (INDEPENDENT_AMBULATORY_CARE_PROVIDER_SITE_OTHER): Payer: PPO | Admitting: Psychiatry

## 2020-05-12 DIAGNOSIS — F411 Generalized anxiety disorder: Secondary | ICD-10-CM | POA: Diagnosis not present

## 2020-05-12 DIAGNOSIS — F5105 Insomnia due to other mental disorder: Secondary | ICD-10-CM

## 2020-05-12 DIAGNOSIS — F331 Major depressive disorder, recurrent, moderate: Secondary | ICD-10-CM

## 2020-05-12 DIAGNOSIS — G4733 Obstructive sleep apnea (adult) (pediatric): Secondary | ICD-10-CM

## 2020-05-12 MED ORDER — DULOXETINE HCL 30 MG PO CPEP: ORAL_CAPSULE | ORAL | 0 refills | Status: DC

## 2020-05-12 MED ORDER — NORTRIPTYLINE HCL 25 MG PO CAPS: ORAL_CAPSULE | ORAL | 0 refills | Status: DC

## 2020-05-12 NOTE — Patient Instructions (Signed)
Reduce duloxetine or Cymbalta to 30 mg daily for 10 days and then stop it  Start nortriptyline 25 mg capsules 1 at night for 5 nights, then 2 at night for 5 nights, then 3 at night

## 2020-05-12 NOTE — Progress Notes (Signed)
Candice Velasquez [id number] 1950-12-19 70 y.o.  Subjective:   Patient ID:  Candice Velasquez is a 70 y.o. (DOB 05/16/1950) female.  Chief Complaint:  Chief Complaint  Patient presents with  . Follow-up  . Major depressive disorder, recurrent episode, moderate ([place])  . Depression  . Anxiety  . Stress    Grief over Candice Velasquez's death    Depression        Candice Velasquez presents to the office today for follow-up of depression, anxiety, and insomnia.  seen October 2019.  Duloxetine was reduced to 60 mg daily per her request.  She had been diagnosed with sleep apnea and was encouraged to use CPAP.   seen December 2020.  The following changes were made: She wants to try Wellbutrin again to help with depression and smoking cessation. Start bupropion XL 150 mg tablet 1 each morning for 1 week, Then increase to 2 each morning which equals 300 mg daily.  She called back in March 29, 2019 reporting that she was too shaky and jittery with the Wellbutrin 300 mg daily. She ended up stopping it.  Also dizzy in morning.  150 mg didn't help.     Last seen February 2021 with the following noted: Dropping to PT work tomorrow.  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  Won't be less stressed bc less money.  Can't physically stand for 8 hours daily.  Anxious about the change.  Not markedly depressed at present.  Sleep is OK and not taking anything for sleep.  Not caring for the house bc "I don't care".   [place] and hurt herself.  Using a cane. Drinks [employer]epsi[employer] exclusively.  When works usies 5 hour energy. Sleeping too much or in bed too much.  Working until 10 pm.   Separated since April 2019.    ExH has renal cancer and stroke 2018 and Hx mania from prednisone.  Smokes too much. Not using CPAP bc hates the machine from [place].   Some depression over the situation.   Plan no med changes  08/29/2019 appointment with the following noted: No Wellbutrin bc fear of dizziness. Fell and broke rib.   Scared of fall and using cane.  Hx vertigo. Mobility poor. GD 70 yo missing after moved out.  Working PT. Hates apt. Stress with D and Candice Velasquez ongoing.  Dep and anxiety at baseline and not resolved.  Cries about loneliness. Patient denies difficulty with sleep initiation or maintenance. Denies appetite disturbance.  Patient reports that energy and motivation have been good.  Patient denies any difficulty with concentration.  Patient denies any suicidal ideation. She asks if there is anything else she could try for depression and anxiety. Asks about support dog. Plan: Trial Rexulti 0.5 mg for residual chronic depression.  09/16/2019 phone call complaining of feeling unsteady and dizzy with balance problems as well as restless and fidgety on Rexulti 2 mg for about a week.  Was instructed to stop it to see if these are side effects.  10/07/2019 appointment with the following noted: Urgent appt.  Not up to RTW.  Wants to retire.  Needs another letter stating she is not medically able to RTW due to depression and knee problems. Still restless and fidgety but not gone since stopping Rexulti which never really helped the depression at 2 mg for several days. Needs lorazepam prn anxiety.  Using it for restlessness which is gradually better.   Retirement date set for Nov 1.  Says it takes several  months to retire. Chronic residual depression and anxiety. Plan: DC Rexulti DT NR and restlessness which is gradually resolving.  01/06/2020 appointment with the following noted: Dr. Shelia Media wanted her to come talk to [place] about problems with Candice Velasquez.   Used to be close and she acts like I don't matter any more.  Candice Velasquez says she's done nothing wrong.  Right leg still shakes for unclear reasons. Finishing up [place].  They think it's helpful.  Is driving again. Needs PT job but hesitant bc fear she won't get hired with tremor and cane.  Needs PT job but wishes she didn't go back to work. Dx severe sleep  recently but not using CPAP yet. Plan: no med changes and finish up [place] as planned  03/05/20 appt with following noted:  Finished [place].  I don't think it did much.  Candice Velasquez was great.   Wonders about getting an emotional support animal.   Doesn't have a dog yet.  Afraid to be alone.  Wants me to write a support letter so she can have it at her appt.  One that could help if she fell.   Takes lorazepam once weekly.  Needs trazodone for sleep and it helps.  .  06/09/2020 appt noted: Candice Velasquez died and she ddint think she cared but is dealing with grief.  They divorced. She doesn't think duloxetine is helpful anymore.  So upset over all this.  Worry over how this might affect her financially too.  Worried about future.  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 Fidgets R leg and thinks it might be from [place] after stopping.  Tremor esp hand and sometimes all over.  Read about it and saw TV programs.   No CPAP machine yet.  Needs to take it for service.    Past Psychiatric Medication Trials: Duloxetine 90, Wellbutrin 300 irritable, mirtazapine, Trintellix, fluoxetine, venlafaxine, Viibryd, sertraline,  Lithium Seroquel, Abilify side effects, lamotrigine,  Vyvanse, Ritalin,  buspirone, trazodone, Belsomra, Lunesta side effects, diazepam, lorazepam, Sonata,  doxepin,  temazepam, clonazepam, Tranxene, Ambien  Chantix, Under care of this office since November 1998  Review of Systems:  Review of Systems  Cardiovascular: Positive for chest pain. Negative for palpitations.  Genitourinary: Positive for enuresis and frequency.  Musculoskeletal: Positive for arthralgias, back pain and gait problem.  Neurological: Positive for dizziness, tremors and weakness.  Psychiatric/Behavioral: Positive for depression.    Medications: I have reviewed the patient's current medications.  Current Outpatient Medications  Medication Sig Dispense Refill  . ibuprofen (ADVIL) 800 MG tablet Take 1 tablet (800 mg  total) by mouth every 8 (eight) hours as needed. 21 tablet 0  . LORazepam (ATIVAN) 1 MG tablet 1 daily prn anxiety 30 tablet 0  . mirabegron ER (MYRBETRIQ) 50 MG TB24 tablet Take 50 mg by mouth daily.    . nortriptyline (PAMELOR) 25 MG capsule 1 at night for 5 nights then 2 at night for 5 nights, then 3 at night 90 capsule 0  . OVER THE COUNTER MEDICATION 2 chewables    . traZODone (DESYREL) 50 MG tablet Take 1-2 tablets (50-100 mg total) by mouth at bedtime. 180 tablet 0  . acetaminophen (TYLENOL) 500 MG tablet Take 500 mg by mouth every 4 (four) hours as needed. (Patient not taking: Reported on 2020-06-09)    . Cranberry 500 MG CAPS Take by mouth. (Patient not taking: Reported on 06-09-20)    . DULoxetine (CYMBALTA) 30 MG capsule 1 for 10 days then stop it. 10  capsule 0  . Multiple Vitamin (MULTIVITAMIN) capsule Take 1 capsule by mouth daily. (Patient not taking: Reported on 05/12/2020)    . triamterene-hydrochlorothiazide (MAXZIDE-25) 37.5-25 MG per tablet Take 0.5 tablets by mouth daily.  (Patient not taking: Reported on 05/12/2020)     No current facility-administered medications for this visit.    Medication Side Effects: None  Allergies: No Known Allergies  Past Medical History:  Diagnosis Date  . Depression   . Diabetes mellitus without complication ([place])   . Falling   . Hypertension     History reviewed. No pertinent family history.  Social History   Socioeconomic History  . Marital status: Legally Separated    Spouse name: Not on file  . Number of children: Not on file  . Years of education: Not on file  . Highest education level: Not on file  Occupational History  . Not on file  Tobacco Use  . Smoking status: Current Every Day Smoker    Packs/day: 1.00    Years: 31.00    Pack years: 31.00    Types: Cigarettes    Last attempt to quit: 02/18/2015    Years since quitting: 5.2  . Smokeless tobacco: Never Used  . Tobacco comment: pt states she has called smoking  help line and plans to quit 12/09/19  Substance and Sexual Activity  . Alcohol use: Yes    Alcohol/week: 0.0 standard drinks  . Drug use: Not Currently  . Sexual activity: Not on file  Other Topics Concern  . Not on file  Social History Narrative  . Not on file   Social Determinants of Health   Financial Resource Strain: Not on file  Food Insecurity: Not on file  Transportation Needs: Not on file  Physical Activity: Not on file  Stress: Not on file  Social Connections: Not on file  Intimate Partner Violence: Not on file    Past Medical History, Surgical history, Social history, and Family history were reviewed and updated as appropriate.   Please see review of systems for further details on the patient's review from today.   Objective:   Physical Exam:  There were no vitals taken for this visit.  Physical Exam Constitutional:      General: She is not in acute distress.    Appearance: She is well-developed.  Musculoskeletal:        General: No deformity.  Neurological:     Mental Status: She is alert and oriented to person, place, and time.     Coordination: Coordination abnormal.     Comments: Using cane  Psychiatric:        Attention and Perception: Attention and perception normal. She does not perceive auditory or visual hallucinations.        Mood and Affect: Mood is anxious and depressed. Affect is not labile, blunt, angry, tearful or inappropriate.        Behavior: Behavior normal.        Thought Content: Thought content normal. Thought content does not include homicidal or suicidal ideation. Thought content does not include homicidal or suicidal plan.        Cognition and Memory: Cognition and memory normal.        Judgment: Judgment normal.     Comments: Insight intact. No delusions.  Residual chronic depression and anxiety she feels is worse. Mildly pressured irritability. Worried about future.       Lab Review:     Component Value Date/Time   NA 138  12/12/2007  1320   K 3.9 12/12/2007 1320   CL 105 12/12/2007 1320   CO2 26 12/12/2007 1320   GLUCOSE 107 (H) 12/12/2007 1320   BUN 8 12/12/2007 1320   CREATININE 0.54 12/12/2007 1320   CALCIUM 9.5 12/12/2007 1320   PROT 6.6 12/12/2007 1320   ALBUMIN 3.9 12/12/2007 1320   AST 22 12/12/2007 1320   ALT 23 12/12/2007 1320   ALKPHOS 52 12/12/2007 1320   BILITOT 0.7 12/12/2007 1320   GFRNONAA >60 12/12/2007 1320   GFRAA  12/12/2007 1320    >60        The eGFR has been calculated using the MDRD equation. This calculation has not been validated in all clinical       Component Value Date/Time   WBC 5.6 12/12/2007 1320   RBC 4.00 12/12/2007 1320   HGB 13.8 12/12/2007 1320   HCT 40.1 12/12/2007 1320   PLT 239 12/12/2007 1320   MCV 100.3 (H) 12/12/2007 1320   MCHC 34.4 12/12/2007 1320   RDW 13.6 12/12/2007 1320   LYMPHSABS 1.7 12/12/2007 1320   MONOABS 0.5 12/12/2007 1320   EOSABS 0.2 12/12/2007 1320   BASOSABS 0.0 12/12/2007 1320   Sleep study several years ago and dx OSA but not using CPAP  No results found for: POCLITH, LITHIUM   No results found for: PHENYTOIN, PHENOBARB, VALPROATE, CBMZ   .res Assessment: Plan:    Candice Velasquez was seen today for follow-up, major depressive disorder, recurrent episode, moderate (hcc), depression, anxiety and stress.  Diagnoses and all orders for this visit:  Major depressive disorder, recurrent episode, moderate (HCC) -     DULoxetine (CYMBALTA) 30 MG capsule; 1 for 10 days then stop it. -     nortriptyline (PAMELOR) 25 MG capsule; 1 at night for 5 nights then 2 at night for 5 nights, then 3 at night  Generalized anxiety disorder -     nortriptyline (PAMELOR) 25 MG capsule; 1 at night for 5 nights then 2 at night for 5 nights, then 3 at night  Insomnia due to mental condition  Obstructive sleep apnea    Greater than 50% of 35 min face to face time with patient was spent on counseling and coordination of care. We discussed TRD with  multiple med failures noted.  Consider TCA.  Prefer to avoid MAOI.  TCA might help bladder also.  Consider Rexulti if can afford it.  Consider Latuda    Would like to use atypical bc depression with mixed features but hesitant to do so bc pt already has tremor and possibly akathisia/ mild dyskinesia.  She wants to consider nortriptyline  Reduce duloxetine or Cymbalta to 30 mg daily for 10 days and then stop it  Start nortriptyline 25 mg capsules 1 at night for 5 nights, then 2 at night for 5 nights, then 3 at night  Disc LOA from work and planned retirement.  Not sure there was adequate trial of 150 mg daily Wellbutrin.  300 mg made her too irritable.  Consider rety with 150 mg and reduce caffeine.  Extensive discussion of OSA and need for use of CPAP bc recently confirmed severe sleep apnea.   Disc her frustrations with it and strategies to help. She plans to start it back.  Expect substantial improvement.  Supportive therapy over loneliness.    Ok prn lorazepam.  She is not taking anything for sleep right now and is sleeping okay though she does have a history of chronic insomnia.  FU  6-8 weeks  Lynder Parents, MD, DFAPA  Please see After Visit Summary for patient specific instructions.  Future Appointments  Date Time Provider [place]  07/28/2020 10:30 AM Lomax, Amy, NP [facility] None    No orders of the defined types were placed in this encounter.   -------------------------------

## 2020-05-15 DIAGNOSIS — R739 Hyperglycemia, unspecified: Secondary | ICD-10-CM | POA: Diagnosis not present

## 2020-06-02 ENCOUNTER — Telehealth: Payer: Self-pay | Admitting: Neurology

## 2020-06-02 NOTE — Telephone Encounter (Signed)
Called the patient back and advised that Dr. Vickey Huger has agreed for the use of new machine.  Advised that the DME company is aware of that.  Patient states she will contact them back and let them know she is okay to move forward with the [facility]

## 2020-06-02 NOTE — Telephone Encounter (Signed)
Pt called and stated that she spoke to Bunk Foss at [facility] and they asked her to call and find out if it is ok to get the [facility] sleep apnea machine instead of the [facility]. Pt states she has to call the DME back to let them know soon. Please advise.

## 2020-06-04 ENCOUNTER — Ambulatory Visit: Payer: PPO | Admitting: Psychiatry

## 2020-06-08 ENCOUNTER — Other Ambulatory Visit: Payer: Self-pay | Admitting: Psychiatry

## 2020-06-08 ENCOUNTER — Telehealth: Payer: Self-pay | Admitting: Psychiatry

## 2020-06-08 MED ORDER — IMIPRAMINE HCL 25 MG PO TABS: ORAL_TABLET | ORAL | 0 refills | Status: DC

## 2020-06-08 NOTE — Telephone Encounter (Signed)
Please review

## 2020-06-08 NOTE — Telephone Encounter (Signed)
Pt called to report not much benefit with Nortriptyline and feeling very antsy. Asking to wean off and start Duloxetine again. Reported only taking 2 Nortriptyline 25 mg at night. Contact [phone] Apt 4/13

## 2020-06-08 NOTE — Telephone Encounter (Signed)
Candice Velasquez wants to come off of the nortripylene. She is not feeling well on it and also having gastro intestinal issues. How does she reduce med?

## 2020-06-08 NOTE — Telephone Encounter (Signed)
Duloxetine clearly did not work well.  Nortriptyline sometimes can be activating though it usually is not.  Sounds like she is felt too activated from it.  There is another med in this family that is not as likely to make her jittery, imipramine.  Have her stop nortriptyline and start imipramine 25 mg tablets 2 at night for 5 days then 3 at night.  This might also help with her sleep which is been a chronic problem to.

## 2020-06-08 NOTE — Telephone Encounter (Signed)
I believe this is a duplicate message

## 2020-06-09 NOTE — Telephone Encounter (Signed)
Ok I see it now,the pt wanted me to make sure it was sent to the right pharmacy.

## 2020-06-09 NOTE — Telephone Encounter (Signed)
Yes, you can see it under med list

## 2020-06-09 NOTE — Telephone Encounter (Signed)
Did I send it to the pharmacy she wanted?

## 2020-06-09 NOTE — Telephone Encounter (Signed)
Yes

## 2020-06-09 NOTE — Telephone Encounter (Signed)
Dr Jennelle Human did you send in the Rx for imipramine?

## 2020-06-10 NOTE — Telephone Encounter (Signed)
Reviewed

## 2020-06-14 ENCOUNTER — Telehealth: Payer: Self-pay | Admitting: Psychiatry
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
## 2020-06-19 ENCOUNTER — Telehealth: Payer: Self-pay | Admitting: Psychiatry

## 2020-06-19 NOTE — Telephone Encounter (Signed)
Please review

## 2020-06-19 NOTE — Telephone Encounter (Signed)
Restart duloxetine 30 mg capsule  1 daily for 4 days, then 2 daily or 1 of the 60 mg capsules.

## 2020-06-19 NOTE — Telephone Encounter (Signed)
Candice Velasquez called back to report that she feels that the imipramine is out of her system.  She only took 2 on 3/26 and it was the first and only time she had taken this. She has taken any antidepressant since she talked with you 3/27.  The only medication she has taken is the Myrbetriq and Trazodone.  She does not like the immipramine or nortriptyline.  She thinks she would just like to go back on the Cymbalta.  Please call to discuss options.  Appt 4/13

## 2020-06-22 ENCOUNTER — Other Ambulatory Visit: Payer: Self-pay

## 2020-06-22 DIAGNOSIS — F331 Major depressive disorder, recurrent, moderate: Secondary | ICD-10-CM

## 2020-06-22 MED ORDER — DULOXETINE HCL 30 MG PO CPEP: ORAL_CAPSULE | ORAL | 0 refills | Status: DC

## 2020-06-22 NOTE — Telephone Encounter (Signed)
Pt called back to clarify the instructions left. She did verbalized understanding and will take 60 mg daily Duloxetine until her next apt on 4/13  She has plenty of 60 mg at home and only wanted me to send #4 30 mg Duloxetine to [facility]. It was submitted.

## 2020-06-22 NOTE — Telephone Encounter (Signed)
Pt answered but had a call on hold so she told me to leave this message on a voicemail.I made sure to tell her to call us back if she needs a Rx sent.

## 2020-06-22 NOTE — Telephone Encounter (Signed)
Reviewed

## 2020-06-26 DIAGNOSIS — G4733 Obstructive sleep apnea (adult) (pediatric): Secondary | ICD-10-CM | POA: Diagnosis not present

## 2020-07-01 ENCOUNTER — Encounter: Payer: Self-pay | Admitting: Psychiatry

## 2020-07-01 ENCOUNTER — Telehealth: Payer: Self-pay | Admitting: Psychiatry

## 2020-07-01 ENCOUNTER — Other Ambulatory Visit: Payer: Self-pay | Admitting: Psychiatry

## 2020-07-01 ENCOUNTER — Other Ambulatory Visit: Payer: Self-pay

## 2020-07-01 ENCOUNTER — Ambulatory Visit (INDEPENDENT_AMBULATORY_CARE_PROVIDER_SITE_OTHER): Payer: PPO | Admitting: Psychiatry

## 2020-07-01 DIAGNOSIS — F411 Generalized anxiety disorder: Secondary | ICD-10-CM

## 2020-07-01 DIAGNOSIS — G4733 Obstructive sleep apnea (adult) (pediatric): Secondary | ICD-10-CM | POA: Diagnosis not present

## 2020-07-01 DIAGNOSIS — F331 Major depressive disorder, recurrent, moderate: Secondary | ICD-10-CM | POA: Diagnosis not present

## 2020-07-01 DIAGNOSIS — F5105 Insomnia due to other mental disorder: Secondary | ICD-10-CM | POA: Diagnosis not present

## 2020-07-01 MED ORDER — TRAZODONE HCL 50 MG PO TABS: 50.0000 mg | ORAL_TABLET | Freq: Every day | ORAL | 0 refills | Status: DC
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
## 2020-07-01 NOTE — Patient Instructions (Addendum)
1/2 of Latuda tablet with meal for 1 week, then 1 tablet daily with meal.

## 2020-07-01 NOTE — Telephone Encounter (Signed)
Trazodone sent.

## 2020-07-01 NOTE — Telephone Encounter (Signed)
Pt called reporting [facility]  has not received Trazodone Rx. Pt had apt today.

## 2020-07-13 DIAGNOSIS — I10 Essential (primary) hypertension: Secondary | ICD-10-CM | POA: Diagnosis not present

## 2020-07-13 DIAGNOSIS — R451 Restlessness and agitation: Secondary | ICD-10-CM | POA: Diagnosis not present

## 2020-07-13 DIAGNOSIS — M25561 Pain in right knee: Secondary | ICD-10-CM | POA: Diagnosis not present

## 2020-07-16 ENCOUNTER — Telehealth: Payer: Self-pay | Admitting: Psychiatry

## 2020-07-16 NOTE — Telephone Encounter (Signed)
Please review

## 2020-07-16 NOTE — Telephone Encounter (Addendum)
Pt called requesting a letter to [facility] stating she is no longer able to live a lone due to health. She needs to be at a independent living facility and can receive assistance as needed. Pt will pick up. Call [phone] when ready.

## 2020-07-16 NOTE — Telephone Encounter (Signed)
OK to write DT mental and physical health limitations

## 2020-07-20 NOTE — Telephone Encounter (Signed)
Signed and put in office box
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
## 2020-07-27 NOTE — Patient Instructions (Incomplete)
Please continue using your CPAP regularly. While your insurance requires that you use CPAP at least 4 hours each night on 70% of the nights, I recommend, that you not skip any nights and use it throughout the night if you can. Getting used to CPAP and staying with the treatment long term does take time and patience and discipline. Untreated obstructive sleep apnea when it is moderate to severe can have an adverse impact on cardiovascular health and raise her risk for heart disease, arrhythmias, hypertension, congestive heart failure, stroke and diabetes. Untreated obstructive sleep apnea causes sleep disruption, nonrestorative sleep, and sleep deprivation. This can have an impact on your day to day functioning and cause daytime sleepiness and impairment of cognitive function, memory loss, mood disturbance, and problems focussing. Using CPAP regularly can improve these symptoms.     Sleep Apnea Sleep apnea affects breathing during sleep. It causes breathing to stop for a short time or to become shallow. It can also increase the risk of:  Heart attack.  Stroke.  Being very overweight (obese).  Diabetes.  Heart failure.  Irregular heartbeat. The goal of treatment is to help you breathe normally again. What are the causes? There are three kinds of sleep apnea:  Obstructive sleep apnea. This is caused by a blocked or collapsed airway.  Central sleep apnea. This happens when the brain does not send the right signals to the muscles that control breathing.  Mixed sleep apnea. This is a combination of obstructive and central sleep apnea. The most common cause of this condition is a collapsed or blocked airway. This can happen if:  Your throat muscles are too relaxed.  Your tongue and tonsils are too large.  You are overweight.  Your airway is too small.   What increases the risk?  Being overweight.  Smoking.  Having a small airway.  Being older.  Being female.  Drinking  alcohol.  Taking medicines to calm yourself (sedatives or tranquilizers).  Having family members with the condition. What are the signs or symptoms?  Trouble staying asleep.  Being sleepy or tired during the day.  Getting angry a lot.  Loud snoring.  Headaches in the morning.  Not being able to focus your mind (concentrate).  Forgetting things.  Less interest in sex.  Mood swings.  Personality changes.  Feelings of sadness (depression).  Waking up a lot during the night to pee (urinate).  Dry mouth.  Sore throat. How is this diagnosed?  Your medical history.  A physical exam.  A test that is done when you are sleeping (sleep study). The test is most often done in a sleep lab but may also be done at home. How is this treated?  Sleeping on your side.  Using a medicine to get rid of mucus in your nose (decongestant).  Avoiding the use of alcohol, medicines to help you relax, or certain pain medicines (narcotics).  Losing weight, if needed.  Changing your diet.  Not smoking.  Using a machine to open your airway while you sleep, such as: ? An oral appliance. This is a mouthpiece that shifts your lower jaw forward. ? A CPAP device. This device blows air through a mask when you breathe out (exhale). ? An EPAP device. This has valves that you put in each nostril. ? A BPAP device. This device blows air through a mask when you breathe in (inhale) and breathe out.  Having surgery if other treatments do not work. It is important to get treatment   for sleep apnea. Without treatment, it can lead to:  High blood pressure.  Coronary artery disease.  In men, not being able to have an erection (impotence).  Reduced thinking ability.   Follow these instructions at home: Lifestyle  Make changes that your doctor recommends.  Eat a healthy diet.  Lose weight if needed.  Avoid alcohol, medicines to help you relax, and some pain medicines.  Do not use any  products that contain nicotine or tobacco, such as cigarettes, e-cigarettes, and chewing tobacco. If you need help quitting, ask your doctor. General instructions  Take over-the-counter and prescription medicines only as told by your doctor.  If you were given a machine to use while you sleep, use it only as told by your doctor.  If you are having surgery, make sure to tell your doctor you have sleep apnea. You may need to bring your device with you.  Keep all follow-up visits as told by your doctor. This is important. Contact a doctor if:  The machine that you were given to use during sleep bothers you or does not seem to be working.  You do not get better.  You get worse. Get help right away if:  Your chest hurts.  You have trouble breathing in enough air.  You have an uncomfortable feeling in your back, arms, or stomach.  You have trouble talking.  One side of your body feels weak.  A part of your face is hanging down. These symptoms may be an emergency. Do not wait to see if the symptoms will go away. Get medical help right away. Call your local emergency services (911 in the [place] Do not drive yourself to the hospital. Summary  This condition affects breathing during sleep.  The most common cause is a collapsed or blocked airway.  The goal of treatment is to help you breathe normally while you sleep. This information is not intended to replace advice given to you by your health care provider. Make sure you discuss any questions you have with your health care provider. Document Revised: 12/22/2017 Document Reviewed: 10/31/2017 Elsevier Patient Education  2021 Elsevier Inc.  

## 2020-07-28 ENCOUNTER — Ambulatory Visit: Payer: PPO | Admitting: Family Medicine

## 2020-07-30 DIAGNOSIS — H8113 Benign paroxysmal vertigo, bilateral: Secondary | ICD-10-CM | POA: Diagnosis not present

## 2020-07-30 DIAGNOSIS — R42 Dizziness and giddiness: Secondary | ICD-10-CM | POA: Diagnosis not present

## 2020-08-10 DIAGNOSIS — F17211 Nicotine dependence, cigarettes, in remission: Secondary | ICD-10-CM | POA: Diagnosis not present

## 2020-08-10 DIAGNOSIS — R42 Dizziness and giddiness: Secondary | ICD-10-CM | POA: Diagnosis not present

## 2020-08-12 DIAGNOSIS — R42 Dizziness and giddiness: Secondary | ICD-10-CM | POA: Diagnosis not present

## 2020-08-12 DIAGNOSIS — H8113 Benign paroxysmal vertigo, bilateral: Secondary | ICD-10-CM | POA: Diagnosis not present

## 2020-08-19 ENCOUNTER — Other Ambulatory Visit: Payer: Self-pay

## 2020-08-19 ENCOUNTER — Ambulatory Visit: Payer: PPO | Admitting: Podiatry

## 2020-08-19 ENCOUNTER — Ambulatory Visit (INDEPENDENT_AMBULATORY_CARE_PROVIDER_SITE_OTHER): Payer: PPO

## 2020-08-19 DIAGNOSIS — M779 Enthesopathy, unspecified: Secondary | ICD-10-CM | POA: Diagnosis not present

## 2020-08-19 DIAGNOSIS — M778 Other enthesopathies, not elsewhere classified: Secondary | ICD-10-CM | POA: Diagnosis not present

## 2020-08-19 MED ORDER — MELOXICAM 15 MG PO TABS: 15.0000 mg | ORAL_TABLET | Freq: Every day | ORAL | 1 refills | Status: DC

## 2020-08-20 ENCOUNTER — Telehealth: Payer: Self-pay | Admitting: Podiatry

## 2020-08-20 NOTE — Telephone Encounter (Signed)
Pt called crying stating her foot is completely numb. She can't walk or put her clothes on. I tried to offer her an appt today or tomorrow, but she doesn't have any reliable transportation. She stated she was going to the ED in an ambulance.

## 2020-08-20 NOTE — Telephone Encounter (Signed)
Pt requesting tramadol or pain meds to hold her over until her appt tomorrow with Dr. Irving Shows tomorrow. She is experiencing significant pain, and has been calling since this morning about what she's experiencing. Please advise.

## 2020-08-20 NOTE — Telephone Encounter (Signed)
Pt would like to know what type of injection she received yesterday. Please advise.

## 2020-08-21 ENCOUNTER — Other Ambulatory Visit: Payer: Self-pay

## 2020-08-21 ENCOUNTER — Ambulatory Visit: Payer: PPO | Admitting: Podiatrist

## 2020-08-21 DIAGNOSIS — M779 Enthesopathy, unspecified: Secondary | ICD-10-CM | POA: Diagnosis not present

## 2020-08-24 ENCOUNTER — Ambulatory Visit: Payer: PPO | Admitting: Podiatry

## 2020-08-25 ENCOUNTER — Encounter: Payer: Self-pay | Admitting: Podiatrist

## 2020-08-25 NOTE — Progress Notes (Signed)
Chief Complaint  Patient presents with  . Foot Pain    Pt was seen by Dr. Logan Bores on 08/19/20. Injection was given per pt. Pt states she had severe pain on the top of her right foot on 6/2 and now she no longer has any pain today.      HPI: Patient is 70 y.o. female who presents today for follow up of foot pain.  She saw Dr. Logan Bores and had an injection.  Her foot was more painful the next day, but now it is much improved.    Patient Active Problem List   Diagnosis Date Noted  . Drug induced akathisia 01/29/2020  . Drug-induced tremor 01/29/2020  . Sprain of right rotator cuff capsule 01/29/2020  . Gait instability 01/29/2020  . Tobacco abuse disorder 12/25/2019  . Anxiety and depression 11/14/2019  . Inadequate sleep hygiene 11/14/2019  . COPD not affecting current episode of care (HCC) 11/14/2019  . History of sleep apnea 11/14/2019  . Insomnia due to other mental disorder 11/14/2019  . Nocturia more than twice per night 11/14/2019  . GAD (generalized anxiety disorder) 12/28/2017  . Contusion of face 02/07/2017  . Abrasion of face 02/07/2017  . Accidental fall 02/07/2017    Current Outpatient Medications on File Prior to Visit  Medication Sig Dispense Refill  . acetaminophen (TYLENOL) 500 MG tablet Take 500 mg by mouth every 4 (four) hours as needed.    . Cranberry 500 MG CAPS Take by mouth.    . DULoxetine (CYMBALTA) 30 MG capsule Take 1 capsule (30 mg) by mouth daily for 4 days, then increase to 60 mg daily. (Patient taking differently: Take 60 mg by mouth daily.) 4 capsule 0  . ibuprofen (ADVIL) 800 MG tablet Take 1 tablet (800 mg total) by mouth every 8 (eight) hours as needed. 21 tablet 0  . LORazepam (ATIVAN) 1 MG tablet 1 daily prn anxiety 30 tablet 0  . meloxicam (MOBIC) 15 MG tablet Take 1 tablet (15 mg total) by mouth daily. 30 tablet 1  . mirabegron ER (MYRBETRIQ) 50 MG TB24 tablet Take 50 mg by mouth daily.    . Multiple Vitamin (MULTIVITAMIN) capsule Take 1 capsule by  mouth daily.    Marland Kitchen OVER THE COUNTER MEDICATION 2 chewables    . traZODone (DESYREL) 50 MG tablet Take 1-2 tablets (50-100 mg total) by mouth at bedtime. 180 tablet 0  . triamterene-hydrochlorothiazide (MAXZIDE-25) 37.5-25 MG per tablet Take 0.5 tablets by mouth daily.     No current facility-administered medications on file prior to visit.    No Known Allergies  Review of Systems No fevers, chills, nausea, muscle aches, no difficulty breathing, no calf pain, no chest pain or shortness of breath.   Physical Exam  GENERAL APPEARANCE: Alert, conversant. Appropriately groomed. No acute distress.   VASCULAR: Pedal pulses palpable DP and PT bilateral.  Capillary refill time is immediate to all digits,  Proximal to distal cooling it warm to warm.  Digital perfusion adequate.   NEUROLOGIC: sensation is intact to 5.07 monofilament at 5/5 sites bilateral.  Light touch is intact bilateral, vibratory sensation intact bilateral  MUSCULOSKELETAL: acceptable muscle strength, tone and stability bilateral.  No gross boney pedal deformities noted.  No pain noted or reported with palpation at the 3rd metatarsal phalangeal joint region of the right foot where the injection was administered.     DERMATOLOGIC: skin is warm, supple, and dry.     Assessment     ICD-10-CM   1.  Tendinitis  M77.9      Plan  Exam findings and post injection course was discussed with the patient.  I discussed she may have had the steroid start to work as the anesthetic wore off and the may have caused her the pain she experienced.  At today's visit, the pain has subsided and her foot feels much better.  I recommended she call if any problems arise.  Otherwise she will be seen back as needed.

## 2020-08-26 ENCOUNTER — Encounter: Payer: Self-pay | Admitting: Psychiatry

## 2020-08-26 ENCOUNTER — Ambulatory Visit (INDEPENDENT_AMBULATORY_CARE_PROVIDER_SITE_OTHER): Payer: PPO | Admitting: Psychiatry

## 2020-08-26 ENCOUNTER — Other Ambulatory Visit: Payer: Self-pay

## 2020-08-26 ENCOUNTER — Other Ambulatory Visit: Payer: Self-pay | Admitting: Psychiatry

## 2020-08-26 ENCOUNTER — Telehealth: Payer: Self-pay | Admitting: Psychiatry

## 2020-08-26 DIAGNOSIS — F411 Generalized anxiety disorder: Secondary | ICD-10-CM | POA: Diagnosis not present

## 2020-08-26 DIAGNOSIS — F5105 Insomnia due to other mental disorder: Secondary | ICD-10-CM

## 2020-08-26 DIAGNOSIS — G4733 Obstructive sleep apnea (adult) (pediatric): Secondary | ICD-10-CM

## 2020-08-26 DIAGNOSIS — F331 Major depressive disorder, recurrent, moderate: Secondary | ICD-10-CM | POA: Diagnosis not present

## 2020-08-26 MED ORDER — PAROXETINE HCL 30 MG PO TABS: ORAL_TABLET | ORAL | 1 refills | Status: DC

## 2020-08-26 MED ORDER — DULOXETINE HCL 20 MG PO CPEP: ORAL_CAPSULE | ORAL | 0 refills | Status: DC

## 2020-08-26 NOTE — Patient Instructions (Signed)
The duloxetine or Cymbalta to one of the 30 mg capsules and start one half of paroxetine which is Paxil daily for 1 week. Then stop Cymbalta and increase Paxil to 1 daily
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
## 2020-08-26 NOTE — Telephone Encounter (Signed)
FYI

## 2020-08-26 NOTE — Telephone Encounter (Signed)
Please give her this info.

## 2020-08-26 NOTE — Telephone Encounter (Signed)
Here's her instructions on the med change: Reduce The duloxetine or Cymbalta to one of the 30 mg capsules and start one half of paroxetine which is Paxil daily for 1 week. Then stop Cymbalta and increase Paxil to 1 daily  The Cymbalta is a capsule and she has 60 mg size which cannot be split.  Therefore I'll send in RX for 30 mg Cymbalta for her to take for the week of the transition.

## 2020-08-26 NOTE — Telephone Encounter (Signed)
Pt called in after appt stating that during appt she told CC that the dosage duloxetine was 30mg  but when she got home she realized it was actually 60mg . She wanted to update just in case it would effect the new medication Paroxetine 30mg  which was prescribed today she'll be taking that  now instead. Pls RTC as she needs instructions on what to do with the Duloxetine. Ph [phone]

## 2020-08-27 NOTE — Telephone Encounter (Signed)
Rtc to pt.You sent in 20 mg of cymbalta instead of 30 mg.Was that a mistake?

## 2020-08-27 NOTE — Telephone Encounter (Signed)
Disc this with CMA.  It was a mistake with intention to send in 30 mg duloxetine however pt is at pharmacy to pick it up and it should be fine to use the 20 mg duloxetine to switch instead of the 30 mg duloxetine.  OK to fill this and use it.

## 2020-08-28 ENCOUNTER — Telehealth: Payer: Self-pay | Admitting: Psychiatry

## 2020-08-28 NOTE — Telephone Encounter (Signed)
Spoke with Shanda Bumps and I did let her know to take her to [facility].She stated she won't go willingly and wants your advice.She is not having these thoughts at the moment but has made that comment before.

## 2020-08-28 NOTE — Telephone Encounter (Signed)
Candice Velasquez's daughter Candice Velasquez just called and concerns on her mother. She saw Dr. Jennelle Human this week and her mom said he made some changes to her meds but her mom won't tell her where she keeps her them. Candice Velasquez said Candice Velasquez has had some suicidal thoughts and told her that she does have pills and it wouldn't be messy. She's listed on Candice Velasquez's DPR.

## 2020-08-30 DIAGNOSIS — M778 Other enthesopathies, not elsewhere classified: Secondary | ICD-10-CM | POA: Diagnosis not present

## 2020-08-30 MED ORDER — BETAMETHASONE SOD PHOS & ACET 6 (3-3) MG/ML IJ SUSP
3.0000 mg | Freq: Once | INTRAMUSCULAR | Status: AC
  Administered 2020-08-30: 3 mg via INTRA_ARTICULAR

## 2020-08-30 NOTE — Progress Notes (Signed)
   HPI: 70 y.o. female presenting today for new complaint regarding pain and tenderness to the right midfoot.  Patient states that she feels like she broke a bone.  She has constant throbbing and burning and aching this been going on the last 1-2 weeks.  It is sensitive to touch.  She has been applying lotion and taking ibuprofen for the pain.  She denies a history of injury.  She presents for further treatment and evaluation  Past Medical History:  Diagnosis Date   Depression    Diabetes mellitus without complication (HCC)    Falling    Hypertension      Physical Exam: General: The patient is alert and oriented x3 in no acute distress.  Dermatology: Skin is warm, dry and supple bilateral lower extremities. Negative for open lesions or macerations.  Vascular: Palpable pedal pulses bilaterally. No edema or erythema noted. Capillary refill within normal limits.  Neurological: Epicritic and protective threshold grossly intact bilaterally.   Musculoskeletal Exam: Range of motion within normal limits to all pedal and ankle joints bilateral. Muscle strength 5/5 in all groups bilateral.  Pain on palpation throughout the right midfoot/TMT joint  Radiographic Exam:  Normal osseous mineralization. Joint spaces preserved. No fracture/dislocation/boney destruction.    Assessment: 1.  Right foot capsulitis   Plan of Care:  1. Patient evaluated. X-Rays reviewed.  2.  Injection of 0.5 cc Celestone Soluspan injected into the right midtarsal joint 3.  Prescription for meloxicam 15 mg daily 4.  Recommend good supportive shoes that support the foot 5.  Return to clinic in 3 months for routine foot care      Felecia Shelling, DPM [facility]  Dr. Felecia Shelling, DPM    [address] [place], [place] [address]                Office [phone]  Fax [phone]

## 2020-08-31 NOTE — Telephone Encounter (Signed)
Pt seen 08/26/20 and started antidepressant change from duloxetine to paroxetine.  Hasn't had time to benefit yet.  If she can keep herself safe then need to give the meds a chance to work.  If not go to [facility] or [facility].

## 2020-09-16 DIAGNOSIS — R42 Dizziness and giddiness: Secondary | ICD-10-CM | POA: Diagnosis not present

## 2020-09-16 DIAGNOSIS — H8113 Benign paroxysmal vertigo, bilateral: Secondary | ICD-10-CM | POA: Diagnosis not present

## 2020-09-24 DIAGNOSIS — R42 Dizziness and giddiness: Secondary | ICD-10-CM | POA: Diagnosis not present

## 2020-09-24 DIAGNOSIS — H8113 Benign paroxysmal vertigo, bilateral: Secondary | ICD-10-CM | POA: Diagnosis not present

## 2020-09-25 ENCOUNTER — Telehealth: Payer: Self-pay | Admitting: Podiatry

## 2020-09-25 NOTE — Telephone Encounter (Signed)
Patient wants to know if she should continue to take meloxicam; there is 1 refill on the prescription. She does not wish to continue taking it, but if it is needed she will. Please advise.

## 2020-09-25 NOTE — Telephone Encounter (Signed)
If she is feeling better she does not need to continue. Thanks, Dr. Logan Bores

## 2020-09-29 DIAGNOSIS — F339 Major depressive disorder, recurrent, unspecified: Secondary | ICD-10-CM | POA: Diagnosis not present

## 2020-09-29 DIAGNOSIS — I10 Essential (primary) hypertension: Secondary | ICD-10-CM | POA: Diagnosis not present

## 2020-09-29 DIAGNOSIS — R251 Tremor, unspecified: Secondary | ICD-10-CM | POA: Diagnosis not present

## 2020-09-29 DIAGNOSIS — R42 Dizziness and giddiness: Secondary | ICD-10-CM | POA: Diagnosis not present

## 2020-10-28 DIAGNOSIS — M6281 Muscle weakness (generalized): Secondary | ICD-10-CM | POA: Diagnosis not present

## 2020-10-28 DIAGNOSIS — M79605 Pain in left leg: Secondary | ICD-10-CM | POA: Diagnosis not present

## 2020-10-28 DIAGNOSIS — N3946 Mixed incontinence: Secondary | ICD-10-CM | POA: Diagnosis not present

## 2020-10-28 DIAGNOSIS — R262 Difficulty in walking, not elsewhere classified: Secondary | ICD-10-CM | POA: Diagnosis not present

## 2020-10-28 DIAGNOSIS — M79604 Pain in right leg: Secondary | ICD-10-CM | POA: Diagnosis not present

## 2020-10-28 DIAGNOSIS — R260 Ataxic gait: Secondary | ICD-10-CM | POA: Diagnosis not present

## 2020-10-29 ENCOUNTER — Telehealth: Payer: Self-pay | Admitting: Psychiatry

## 2020-10-29 NOTE — Telephone Encounter (Signed)
Pt called and left message that she does not know if she is suppose to continue on paxil or stop it. If so please send in rx to [facility] on [address].

## 2020-10-30 ENCOUNTER — Other Ambulatory Visit: Payer: Self-pay | Admitting: Psychiatry

## 2020-10-30 ENCOUNTER — Other Ambulatory Visit: Payer: Self-pay

## 2020-10-30 DIAGNOSIS — R260 Ataxic gait: Secondary | ICD-10-CM | POA: Diagnosis not present

## 2020-10-30 DIAGNOSIS — M79605 Pain in left leg: Secondary | ICD-10-CM | POA: Diagnosis not present

## 2020-10-30 DIAGNOSIS — F331 Major depressive disorder, recurrent, moderate: Secondary | ICD-10-CM

## 2020-10-30 DIAGNOSIS — F411 Generalized anxiety disorder: Secondary | ICD-10-CM

## 2020-10-30 DIAGNOSIS — N3946 Mixed incontinence: Secondary | ICD-10-CM | POA: Diagnosis not present

## 2020-10-30 DIAGNOSIS — M79604 Pain in right leg: Secondary | ICD-10-CM | POA: Diagnosis not present

## 2020-10-30 DIAGNOSIS — M6281 Muscle weakness (generalized): Secondary | ICD-10-CM | POA: Diagnosis not present

## 2020-10-30 DIAGNOSIS — R262 Difficulty in walking, not elsewhere classified: Secondary | ICD-10-CM | POA: Diagnosis not present

## 2020-10-30 MED ORDER — PAROXETINE HCL 30 MG PO TABS: 30.0000 mg | ORAL_TABLET | Freq: Every day | ORAL | 0 refills | Status: DC

## 2020-10-30 NOTE — Telephone Encounter (Signed)
Pt informed

## 2020-10-30 NOTE — Telephone Encounter (Signed)
Please review

## 2020-10-30 NOTE — Telephone Encounter (Signed)
Addendum to 10/29/20 message. Lea called back and wants to make sure that Dr. Jennelle Human or the nurse let her know something about continuing the Paxil or stopping it before he leaves today. Her phone number is [phone].

## 2020-10-30 NOTE — Telephone Encounter (Signed)
Yes absolutely she should continue to paroxetine 30 mg tablets 1 daily and she should now be off duloxetine for some time.  We switched her antidepressant at her last visit to paroxetine.  She will feel bad if she misses any doses so be consistent.

## 2020-10-31 DIAGNOSIS — Z203 Contact with and (suspected) exposure to rabies: Secondary | ICD-10-CM | POA: Diagnosis not present

## 2020-10-31 DIAGNOSIS — S01511A Laceration without foreign body of lip, initial encounter: Secondary | ICD-10-CM | POA: Diagnosis not present

## 2020-10-31 DIAGNOSIS — M542 Cervicalgia: Secondary | ICD-10-CM | POA: Diagnosis not present

## 2020-10-31 DIAGNOSIS — Z23 Encounter for immunization: Secondary | ICD-10-CM | POA: Diagnosis not present

## 2020-10-31 DIAGNOSIS — I10 Essential (primary) hypertension: Secondary | ICD-10-CM | POA: Diagnosis not present

## 2020-10-31 DIAGNOSIS — Y999 Unspecified external cause status: Secondary | ICD-10-CM | POA: Diagnosis not present

## 2020-10-31 DIAGNOSIS — R519 Headache, unspecified: Secondary | ICD-10-CM | POA: Diagnosis not present

## 2020-10-31 DIAGNOSIS — M79642 Pain in left hand: Secondary | ICD-10-CM | POA: Diagnosis not present

## 2020-10-31 DIAGNOSIS — M47812 Spondylosis without myelopathy or radiculopathy, cervical region: Secondary | ICD-10-CM | POA: Diagnosis not present

## 2020-10-31 DIAGNOSIS — S60512A Abrasion of left hand, initial encounter: Secondary | ICD-10-CM | POA: Diagnosis not present

## 2020-10-31 DIAGNOSIS — W01198A Fall on same level from slipping, tripping and stumbling with subsequent striking against other object, initial encounter: Secondary | ICD-10-CM | POA: Diagnosis not present

## 2020-10-31 DIAGNOSIS — R220 Localized swelling, mass and lump, head: Secondary | ICD-10-CM | POA: Diagnosis not present

## 2020-10-31 DIAGNOSIS — S0081XA Abrasion of other part of head, initial encounter: Secondary | ICD-10-CM | POA: Diagnosis not present

## 2020-10-31 DIAGNOSIS — R58 Hemorrhage, not elsewhere classified: Secondary | ICD-10-CM | POA: Diagnosis not present

## 2020-11-09 DIAGNOSIS — R260 Ataxic gait: Secondary | ICD-10-CM | POA: Diagnosis not present

## 2020-11-09 DIAGNOSIS — M79604 Pain in right leg: Secondary | ICD-10-CM | POA: Diagnosis not present

## 2020-11-09 DIAGNOSIS — N3946 Mixed incontinence: Secondary | ICD-10-CM | POA: Diagnosis not present

## 2020-11-09 DIAGNOSIS — M79605 Pain in left leg: Secondary | ICD-10-CM | POA: Diagnosis not present

## 2020-11-09 DIAGNOSIS — R262 Difficulty in walking, not elsewhere classified: Secondary | ICD-10-CM | POA: Diagnosis not present

## 2020-11-09 DIAGNOSIS — M6281 Muscle weakness (generalized): Secondary | ICD-10-CM | POA: Diagnosis not present

## 2020-11-10 DIAGNOSIS — R262 Difficulty in walking, not elsewhere classified: Secondary | ICD-10-CM | POA: Diagnosis not present

## 2020-11-10 DIAGNOSIS — M79605 Pain in left leg: Secondary | ICD-10-CM | POA: Diagnosis not present

## 2020-11-10 DIAGNOSIS — M6281 Muscle weakness (generalized): Secondary | ICD-10-CM | POA: Diagnosis not present

## 2020-11-10 DIAGNOSIS — N3946 Mixed incontinence: Secondary | ICD-10-CM | POA: Diagnosis not present

## 2020-11-10 DIAGNOSIS — R260 Ataxic gait: Secondary | ICD-10-CM | POA: Diagnosis not present

## 2020-11-10 DIAGNOSIS — M79604 Pain in right leg: Secondary | ICD-10-CM | POA: Diagnosis not present

## 2020-11-12 DIAGNOSIS — R262 Difficulty in walking, not elsewhere classified: Secondary | ICD-10-CM | POA: Diagnosis not present

## 2020-11-12 DIAGNOSIS — M79604 Pain in right leg: Secondary | ICD-10-CM | POA: Diagnosis not present

## 2020-11-12 DIAGNOSIS — M6281 Muscle weakness (generalized): Secondary | ICD-10-CM | POA: Diagnosis not present

## 2020-11-12 DIAGNOSIS — N3946 Mixed incontinence: Secondary | ICD-10-CM | POA: Diagnosis not present

## 2020-11-12 DIAGNOSIS — R260 Ataxic gait: Secondary | ICD-10-CM | POA: Diagnosis not present

## 2020-11-12 DIAGNOSIS — M79605 Pain in left leg: Secondary | ICD-10-CM | POA: Diagnosis not present

## 2020-11-13 DIAGNOSIS — M79604 Pain in right leg: Secondary | ICD-10-CM | POA: Diagnosis not present

## 2020-11-13 DIAGNOSIS — M6281 Muscle weakness (generalized): Secondary | ICD-10-CM | POA: Diagnosis not present

## 2020-11-13 DIAGNOSIS — R262 Difficulty in walking, not elsewhere classified: Secondary | ICD-10-CM | POA: Diagnosis not present

## 2020-11-13 DIAGNOSIS — M79605 Pain in left leg: Secondary | ICD-10-CM | POA: Diagnosis not present

## 2020-11-13 DIAGNOSIS — R260 Ataxic gait: Secondary | ICD-10-CM | POA: Diagnosis not present

## 2020-11-13 DIAGNOSIS — N3946 Mixed incontinence: Secondary | ICD-10-CM | POA: Diagnosis not present

## 2020-11-15 DIAGNOSIS — M79605 Pain in left leg: Secondary | ICD-10-CM | POA: Diagnosis not present

## 2020-11-15 DIAGNOSIS — N3946 Mixed incontinence: Secondary | ICD-10-CM | POA: Diagnosis not present

## 2020-11-15 DIAGNOSIS — R260 Ataxic gait: Secondary | ICD-10-CM | POA: Diagnosis not present

## 2020-11-15 DIAGNOSIS — R262 Difficulty in walking, not elsewhere classified: Secondary | ICD-10-CM | POA: Diagnosis not present

## 2020-11-15 DIAGNOSIS — M79604 Pain in right leg: Secondary | ICD-10-CM | POA: Diagnosis not present

## 2020-11-15 DIAGNOSIS — M6281 Muscle weakness (generalized): Secondary | ICD-10-CM | POA: Diagnosis not present

## 2020-11-16 DIAGNOSIS — R262 Difficulty in walking, not elsewhere classified: Secondary | ICD-10-CM | POA: Diagnosis not present

## 2020-11-16 DIAGNOSIS — R260 Ataxic gait: Secondary | ICD-10-CM | POA: Diagnosis not present

## 2020-11-16 DIAGNOSIS — M79605 Pain in left leg: Secondary | ICD-10-CM | POA: Diagnosis not present

## 2020-11-16 DIAGNOSIS — M79604 Pain in right leg: Secondary | ICD-10-CM | POA: Diagnosis not present

## 2020-11-16 DIAGNOSIS — M6281 Muscle weakness (generalized): Secondary | ICD-10-CM | POA: Diagnosis not present

## 2020-11-16 DIAGNOSIS — N3946 Mixed incontinence: Secondary | ICD-10-CM | POA: Diagnosis not present

## 2020-11-17 ENCOUNTER — Ambulatory Visit: Payer: PPO | Admitting: Psychiatry

## 2020-11-17 DIAGNOSIS — M6281 Muscle weakness (generalized): Secondary | ICD-10-CM | POA: Diagnosis not present

## 2020-11-17 DIAGNOSIS — R260 Ataxic gait: Secondary | ICD-10-CM | POA: Diagnosis not present

## 2020-11-17 DIAGNOSIS — M79604 Pain in right leg: Secondary | ICD-10-CM | POA: Diagnosis not present

## 2020-11-17 DIAGNOSIS — M79605 Pain in left leg: Secondary | ICD-10-CM | POA: Diagnosis not present

## 2020-11-17 DIAGNOSIS — N3946 Mixed incontinence: Secondary | ICD-10-CM | POA: Diagnosis not present

## 2020-11-17 DIAGNOSIS — R262 Difficulty in walking, not elsewhere classified: Secondary | ICD-10-CM | POA: Diagnosis not present

## 2020-11-18 DIAGNOSIS — M6281 Muscle weakness (generalized): Secondary | ICD-10-CM | POA: Diagnosis not present

## 2020-11-18 DIAGNOSIS — M79604 Pain in right leg: Secondary | ICD-10-CM | POA: Diagnosis not present

## 2020-11-18 DIAGNOSIS — N3946 Mixed incontinence: Secondary | ICD-10-CM | POA: Diagnosis not present

## 2020-11-18 DIAGNOSIS — R262 Difficulty in walking, not elsewhere classified: Secondary | ICD-10-CM | POA: Diagnosis not present

## 2020-11-18 DIAGNOSIS — M79605 Pain in left leg: Secondary | ICD-10-CM | POA: Diagnosis not present

## 2020-11-18 DIAGNOSIS — R260 Ataxic gait: Secondary | ICD-10-CM | POA: Diagnosis not present

## 2020-11-19 DIAGNOSIS — N3946 Mixed incontinence: Secondary | ICD-10-CM | POA: Diagnosis not present

## 2020-11-19 DIAGNOSIS — M79605 Pain in left leg: Secondary | ICD-10-CM | POA: Diagnosis not present

## 2020-11-19 DIAGNOSIS — M6281 Muscle weakness (generalized): Secondary | ICD-10-CM | POA: Diagnosis not present

## 2020-11-19 DIAGNOSIS — M79604 Pain in right leg: Secondary | ICD-10-CM | POA: Diagnosis not present

## 2020-11-19 DIAGNOSIS — R262 Difficulty in walking, not elsewhere classified: Secondary | ICD-10-CM | POA: Diagnosis not present

## 2020-11-19 DIAGNOSIS — R260 Ataxic gait: Secondary | ICD-10-CM | POA: Diagnosis not present

## 2020-11-19 DIAGNOSIS — R41841 Cognitive communication deficit: Secondary | ICD-10-CM | POA: Diagnosis not present

## 2020-11-20 DIAGNOSIS — S0993XD Unspecified injury of face, subsequent encounter: Secondary | ICD-10-CM | POA: Diagnosis not present

## 2020-11-20 DIAGNOSIS — M79605 Pain in left leg: Secondary | ICD-10-CM | POA: Diagnosis not present

## 2020-11-20 DIAGNOSIS — M79604 Pain in right leg: Secondary | ICD-10-CM | POA: Diagnosis not present

## 2020-11-20 DIAGNOSIS — R41841 Cognitive communication deficit: Secondary | ICD-10-CM | POA: Diagnosis not present

## 2020-11-20 DIAGNOSIS — M6281 Muscle weakness (generalized): Secondary | ICD-10-CM | POA: Diagnosis not present

## 2020-11-20 DIAGNOSIS — R260 Ataxic gait: Secondary | ICD-10-CM | POA: Diagnosis not present

## 2020-11-20 DIAGNOSIS — N3946 Mixed incontinence: Secondary | ICD-10-CM | POA: Diagnosis not present

## 2020-11-20 DIAGNOSIS — R262 Difficulty in walking, not elsewhere classified: Secondary | ICD-10-CM | POA: Diagnosis not present

## 2020-11-20 DIAGNOSIS — W19XXXD Unspecified fall, subsequent encounter: Secondary | ICD-10-CM | POA: Diagnosis not present

## 2020-11-20 DIAGNOSIS — Z4802 Encounter for removal of sutures: Secondary | ICD-10-CM | POA: Diagnosis not present

## 2020-11-21 DIAGNOSIS — R260 Ataxic gait: Secondary | ICD-10-CM | POA: Diagnosis not present

## 2020-11-21 DIAGNOSIS — R262 Difficulty in walking, not elsewhere classified: Secondary | ICD-10-CM | POA: Diagnosis not present

## 2020-11-21 DIAGNOSIS — M79604 Pain in right leg: Secondary | ICD-10-CM | POA: Diagnosis not present

## 2020-11-21 DIAGNOSIS — M79605 Pain in left leg: Secondary | ICD-10-CM | POA: Diagnosis not present

## 2020-11-21 DIAGNOSIS — R41841 Cognitive communication deficit: Secondary | ICD-10-CM | POA: Diagnosis not present

## 2020-11-21 DIAGNOSIS — M6281 Muscle weakness (generalized): Secondary | ICD-10-CM | POA: Diagnosis not present

## 2020-11-21 DIAGNOSIS — N3946 Mixed incontinence: Secondary | ICD-10-CM | POA: Diagnosis not present

## 2020-11-22 DIAGNOSIS — M6281 Muscle weakness (generalized): Secondary | ICD-10-CM | POA: Diagnosis not present

## 2020-11-22 DIAGNOSIS — M79605 Pain in left leg: Secondary | ICD-10-CM | POA: Diagnosis not present

## 2020-11-22 DIAGNOSIS — N3946 Mixed incontinence: Secondary | ICD-10-CM | POA: Diagnosis not present

## 2020-11-22 DIAGNOSIS — R260 Ataxic gait: Secondary | ICD-10-CM | POA: Diagnosis not present

## 2020-11-22 DIAGNOSIS — R41841 Cognitive communication deficit: Secondary | ICD-10-CM | POA: Diagnosis not present

## 2020-11-22 DIAGNOSIS — R262 Difficulty in walking, not elsewhere classified: Secondary | ICD-10-CM | POA: Diagnosis not present

## 2020-11-22 DIAGNOSIS — M79604 Pain in right leg: Secondary | ICD-10-CM | POA: Diagnosis not present

## 2020-11-23 DIAGNOSIS — M6281 Muscle weakness (generalized): Secondary | ICD-10-CM | POA: Diagnosis not present

## 2020-11-23 DIAGNOSIS — R262 Difficulty in walking, not elsewhere classified: Secondary | ICD-10-CM | POA: Diagnosis not present

## 2020-11-23 DIAGNOSIS — M79604 Pain in right leg: Secondary | ICD-10-CM | POA: Diagnosis not present

## 2020-11-23 DIAGNOSIS — M79605 Pain in left leg: Secondary | ICD-10-CM | POA: Diagnosis not present

## 2020-11-23 DIAGNOSIS — R41841 Cognitive communication deficit: Secondary | ICD-10-CM | POA: Diagnosis not present

## 2020-11-23 DIAGNOSIS — R260 Ataxic gait: Secondary | ICD-10-CM | POA: Diagnosis not present

## 2020-11-23 DIAGNOSIS — N3946 Mixed incontinence: Secondary | ICD-10-CM | POA: Diagnosis not present

## 2020-11-24 DIAGNOSIS — M79605 Pain in left leg: Secondary | ICD-10-CM | POA: Diagnosis not present

## 2020-11-24 DIAGNOSIS — N3946 Mixed incontinence: Secondary | ICD-10-CM | POA: Diagnosis not present

## 2020-11-24 DIAGNOSIS — R262 Difficulty in walking, not elsewhere classified: Secondary | ICD-10-CM | POA: Diagnosis not present

## 2020-11-24 DIAGNOSIS — M6281 Muscle weakness (generalized): Secondary | ICD-10-CM | POA: Diagnosis not present

## 2020-11-24 DIAGNOSIS — M79604 Pain in right leg: Secondary | ICD-10-CM | POA: Diagnosis not present

## 2020-11-24 DIAGNOSIS — R41841 Cognitive communication deficit: Secondary | ICD-10-CM | POA: Diagnosis not present

## 2020-11-24 DIAGNOSIS — R260 Ataxic gait: Secondary | ICD-10-CM | POA: Diagnosis not present

## 2020-11-25 ENCOUNTER — Ambulatory Visit: Payer: PPO | Admitting: Podiatry

## 2020-11-26 DIAGNOSIS — R260 Ataxic gait: Secondary | ICD-10-CM | POA: Diagnosis not present

## 2020-11-26 DIAGNOSIS — N3946 Mixed incontinence: Secondary | ICD-10-CM | POA: Diagnosis not present

## 2020-11-26 DIAGNOSIS — M6281 Muscle weakness (generalized): Secondary | ICD-10-CM | POA: Diagnosis not present

## 2020-11-26 DIAGNOSIS — M79605 Pain in left leg: Secondary | ICD-10-CM | POA: Diagnosis not present

## 2020-11-26 DIAGNOSIS — R262 Difficulty in walking, not elsewhere classified: Secondary | ICD-10-CM | POA: Diagnosis not present

## 2020-11-26 DIAGNOSIS — M79604 Pain in right leg: Secondary | ICD-10-CM | POA: Diagnosis not present

## 2020-11-26 DIAGNOSIS — R41841 Cognitive communication deficit: Secondary | ICD-10-CM | POA: Diagnosis not present

## 2020-11-27 DIAGNOSIS — M6281 Muscle weakness (generalized): Secondary | ICD-10-CM | POA: Diagnosis not present

## 2020-11-27 DIAGNOSIS — R262 Difficulty in walking, not elsewhere classified: Secondary | ICD-10-CM | POA: Diagnosis not present

## 2020-11-27 DIAGNOSIS — N3946 Mixed incontinence: Secondary | ICD-10-CM | POA: Diagnosis not present

## 2020-11-27 DIAGNOSIS — R41841 Cognitive communication deficit: Secondary | ICD-10-CM | POA: Diagnosis not present

## 2020-11-27 DIAGNOSIS — M79605 Pain in left leg: Secondary | ICD-10-CM | POA: Diagnosis not present

## 2020-11-27 DIAGNOSIS — R260 Ataxic gait: Secondary | ICD-10-CM | POA: Diagnosis not present

## 2020-11-27 DIAGNOSIS — M79604 Pain in right leg: Secondary | ICD-10-CM | POA: Diagnosis not present

## 2020-11-29 DIAGNOSIS — M79605 Pain in left leg: Secondary | ICD-10-CM | POA: Diagnosis not present

## 2020-11-29 DIAGNOSIS — M79604 Pain in right leg: Secondary | ICD-10-CM | POA: Diagnosis not present

## 2020-11-29 DIAGNOSIS — R260 Ataxic gait: Secondary | ICD-10-CM | POA: Diagnosis not present

## 2020-11-29 DIAGNOSIS — M6281 Muscle weakness (generalized): Secondary | ICD-10-CM | POA: Diagnosis not present

## 2020-11-29 DIAGNOSIS — R262 Difficulty in walking, not elsewhere classified: Secondary | ICD-10-CM | POA: Diagnosis not present

## 2020-11-29 DIAGNOSIS — R41841 Cognitive communication deficit: Secondary | ICD-10-CM | POA: Diagnosis not present

## 2020-11-29 DIAGNOSIS — N3946 Mixed incontinence: Secondary | ICD-10-CM | POA: Diagnosis not present

## 2020-11-30 DIAGNOSIS — I10 Essential (primary) hypertension: Secondary | ICD-10-CM | POA: Diagnosis not present

## 2020-11-30 DIAGNOSIS — M79604 Pain in right leg: Secondary | ICD-10-CM | POA: Diagnosis not present

## 2020-11-30 DIAGNOSIS — R262 Difficulty in walking, not elsewhere classified: Secondary | ICD-10-CM | POA: Diagnosis not present

## 2020-11-30 DIAGNOSIS — R260 Ataxic gait: Secondary | ICD-10-CM | POA: Diagnosis not present

## 2020-11-30 DIAGNOSIS — R41841 Cognitive communication deficit: Secondary | ICD-10-CM | POA: Diagnosis not present

## 2020-11-30 DIAGNOSIS — M6281 Muscle weakness (generalized): Secondary | ICD-10-CM | POA: Diagnosis not present

## 2020-11-30 DIAGNOSIS — M79605 Pain in left leg: Secondary | ICD-10-CM | POA: Diagnosis not present

## 2020-11-30 DIAGNOSIS — R7303 Prediabetes: Secondary | ICD-10-CM | POA: Diagnosis not present

## 2020-11-30 DIAGNOSIS — N3946 Mixed incontinence: Secondary | ICD-10-CM | POA: Diagnosis not present

## 2020-12-01 DIAGNOSIS — N3946 Mixed incontinence: Secondary | ICD-10-CM | POA: Diagnosis not present

## 2020-12-01 DIAGNOSIS — R260 Ataxic gait: Secondary | ICD-10-CM | POA: Diagnosis not present

## 2020-12-01 DIAGNOSIS — M79604 Pain in right leg: Secondary | ICD-10-CM | POA: Diagnosis not present

## 2020-12-01 DIAGNOSIS — R262 Difficulty in walking, not elsewhere classified: Secondary | ICD-10-CM | POA: Diagnosis not present

## 2020-12-01 DIAGNOSIS — R41841 Cognitive communication deficit: Secondary | ICD-10-CM | POA: Diagnosis not present

## 2020-12-01 DIAGNOSIS — M79605 Pain in left leg: Secondary | ICD-10-CM | POA: Diagnosis not present

## 2020-12-01 DIAGNOSIS — M6281 Muscle weakness (generalized): Secondary | ICD-10-CM | POA: Diagnosis not present

## 2020-12-02 ENCOUNTER — Ambulatory Visit (INDEPENDENT_AMBULATORY_CARE_PROVIDER_SITE_OTHER): Payer: PPO | Admitting: Psychiatry

## 2020-12-02 ENCOUNTER — Encounter: Payer: Self-pay | Admitting: Psychiatry

## 2020-12-02 ENCOUNTER — Other Ambulatory Visit: Payer: Self-pay

## 2020-12-02 DIAGNOSIS — G4733 Obstructive sleep apnea (adult) (pediatric): Secondary | ICD-10-CM | POA: Diagnosis not present

## 2020-12-02 DIAGNOSIS — F411 Generalized anxiety disorder: Secondary | ICD-10-CM | POA: Diagnosis not present

## 2020-12-02 DIAGNOSIS — F17210 Nicotine dependence, cigarettes, uncomplicated: Secondary | ICD-10-CM

## 2020-12-02 DIAGNOSIS — F5105 Insomnia due to other mental disorder: Secondary | ICD-10-CM

## 2020-12-02 DIAGNOSIS — F331 Major depressive disorder, recurrent, moderate: Secondary | ICD-10-CM | POA: Diagnosis not present
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
## 2020-12-02 NOTE — Patient Instructions (Signed)
No med changes

## 2020-12-03 DIAGNOSIS — M79605 Pain in left leg: Secondary | ICD-10-CM | POA: Diagnosis not present

## 2020-12-03 DIAGNOSIS — M79604 Pain in right leg: Secondary | ICD-10-CM | POA: Diagnosis not present

## 2020-12-03 DIAGNOSIS — R262 Difficulty in walking, not elsewhere classified: Secondary | ICD-10-CM | POA: Diagnosis not present

## 2020-12-03 DIAGNOSIS — R41841 Cognitive communication deficit: Secondary | ICD-10-CM | POA: Diagnosis not present

## 2020-12-03 DIAGNOSIS — N3946 Mixed incontinence: Secondary | ICD-10-CM | POA: Diagnosis not present

## 2020-12-03 DIAGNOSIS — R260 Ataxic gait: Secondary | ICD-10-CM | POA: Diagnosis not present

## 2020-12-03 DIAGNOSIS — M6281 Muscle weakness (generalized): Secondary | ICD-10-CM | POA: Diagnosis not present

## 2020-12-04 ENCOUNTER — Ambulatory Visit: Payer: PPO | Admitting: Psychiatry

## 2020-12-06 DIAGNOSIS — R260 Ataxic gait: Secondary | ICD-10-CM | POA: Diagnosis not present

## 2020-12-06 DIAGNOSIS — M6281 Muscle weakness (generalized): Secondary | ICD-10-CM | POA: Diagnosis not present

## 2020-12-06 DIAGNOSIS — N3946 Mixed incontinence: Secondary | ICD-10-CM | POA: Diagnosis not present

## 2020-12-06 DIAGNOSIS — M79605 Pain in left leg: Secondary | ICD-10-CM | POA: Diagnosis not present

## 2020-12-06 DIAGNOSIS — R262 Difficulty in walking, not elsewhere classified: Secondary | ICD-10-CM | POA: Diagnosis not present

## 2020-12-06 DIAGNOSIS — R41841 Cognitive communication deficit: Secondary | ICD-10-CM | POA: Diagnosis not present

## 2020-12-06 DIAGNOSIS — M79604 Pain in right leg: Secondary | ICD-10-CM | POA: Diagnosis not present

## 2020-12-08 DIAGNOSIS — M6281 Muscle weakness (generalized): Secondary | ICD-10-CM | POA: Diagnosis not present

## 2020-12-08 DIAGNOSIS — R260 Ataxic gait: Secondary | ICD-10-CM | POA: Diagnosis not present

## 2020-12-08 DIAGNOSIS — M79604 Pain in right leg: Secondary | ICD-10-CM | POA: Diagnosis not present

## 2020-12-08 DIAGNOSIS — M79605 Pain in left leg: Secondary | ICD-10-CM | POA: Diagnosis not present

## 2020-12-08 DIAGNOSIS — R262 Difficulty in walking, not elsewhere classified: Secondary | ICD-10-CM | POA: Diagnosis not present

## 2020-12-08 DIAGNOSIS — N3946 Mixed incontinence: Secondary | ICD-10-CM | POA: Diagnosis not present

## 2020-12-08 DIAGNOSIS — R41841 Cognitive communication deficit: Secondary | ICD-10-CM | POA: Diagnosis not present

## 2020-12-10 DIAGNOSIS — R260 Ataxic gait: Secondary | ICD-10-CM | POA: Diagnosis not present

## 2020-12-10 DIAGNOSIS — M79605 Pain in left leg: Secondary | ICD-10-CM | POA: Diagnosis not present

## 2020-12-10 DIAGNOSIS — M6281 Muscle weakness (generalized): Secondary | ICD-10-CM | POA: Diagnosis not present

## 2020-12-10 DIAGNOSIS — R41841 Cognitive communication deficit: Secondary | ICD-10-CM | POA: Diagnosis not present

## 2020-12-10 DIAGNOSIS — R262 Difficulty in walking, not elsewhere classified: Secondary | ICD-10-CM | POA: Diagnosis not present

## 2020-12-10 DIAGNOSIS — M79604 Pain in right leg: Secondary | ICD-10-CM | POA: Diagnosis not present

## 2020-12-10 DIAGNOSIS — N3946 Mixed incontinence: Secondary | ICD-10-CM | POA: Diagnosis not present

## 2020-12-11 DIAGNOSIS — N3946 Mixed incontinence: Secondary | ICD-10-CM | POA: Diagnosis not present

## 2020-12-11 DIAGNOSIS — R260 Ataxic gait: Secondary | ICD-10-CM | POA: Diagnosis not present

## 2020-12-11 DIAGNOSIS — M6281 Muscle weakness (generalized): Secondary | ICD-10-CM | POA: Diagnosis not present

## 2020-12-11 DIAGNOSIS — M79604 Pain in right leg: Secondary | ICD-10-CM | POA: Diagnosis not present

## 2020-12-11 DIAGNOSIS — M79605 Pain in left leg: Secondary | ICD-10-CM | POA: Diagnosis not present

## 2020-12-11 DIAGNOSIS — R262 Difficulty in walking, not elsewhere classified: Secondary | ICD-10-CM | POA: Diagnosis not present

## 2020-12-11 DIAGNOSIS — R41841 Cognitive communication deficit: Secondary | ICD-10-CM | POA: Diagnosis not present

## 2020-12-12 DIAGNOSIS — M6281 Muscle weakness (generalized): Secondary | ICD-10-CM | POA: Diagnosis not present

## 2020-12-12 DIAGNOSIS — M79605 Pain in left leg: Secondary | ICD-10-CM | POA: Diagnosis not present

## 2020-12-12 DIAGNOSIS — M79604 Pain in right leg: Secondary | ICD-10-CM | POA: Diagnosis not present

## 2020-12-12 DIAGNOSIS — N3946 Mixed incontinence: Secondary | ICD-10-CM | POA: Diagnosis not present

## 2020-12-12 DIAGNOSIS — R41841 Cognitive communication deficit: Secondary | ICD-10-CM | POA: Diagnosis not present

## 2020-12-12 DIAGNOSIS — R262 Difficulty in walking, not elsewhere classified: Secondary | ICD-10-CM | POA: Diagnosis not present

## 2020-12-12 DIAGNOSIS — R260 Ataxic gait: Secondary | ICD-10-CM | POA: Diagnosis not present

## 2020-12-13 DIAGNOSIS — N3946 Mixed incontinence: Secondary | ICD-10-CM | POA: Diagnosis not present

## 2020-12-13 DIAGNOSIS — M6281 Muscle weakness (generalized): Secondary | ICD-10-CM | POA: Diagnosis not present

## 2020-12-13 DIAGNOSIS — M79604 Pain in right leg: Secondary | ICD-10-CM | POA: Diagnosis not present

## 2020-12-13 DIAGNOSIS — R41841 Cognitive communication deficit: Secondary | ICD-10-CM | POA: Diagnosis not present

## 2020-12-13 DIAGNOSIS — M79605 Pain in left leg: Secondary | ICD-10-CM | POA: Diagnosis not present

## 2020-12-13 DIAGNOSIS — R262 Difficulty in walking, not elsewhere classified: Secondary | ICD-10-CM | POA: Diagnosis not present

## 2020-12-13 DIAGNOSIS — R260 Ataxic gait: Secondary | ICD-10-CM | POA: Diagnosis not present

## 2020-12-14 DIAGNOSIS — R41841 Cognitive communication deficit: Secondary | ICD-10-CM | POA: Diagnosis not present

## 2020-12-14 DIAGNOSIS — M79604 Pain in right leg: Secondary | ICD-10-CM | POA: Diagnosis not present

## 2020-12-14 DIAGNOSIS — R262 Difficulty in walking, not elsewhere classified: Secondary | ICD-10-CM | POA: Diagnosis not present

## 2020-12-14 DIAGNOSIS — N3946 Mixed incontinence: Secondary | ICD-10-CM | POA: Diagnosis not present

## 2020-12-14 DIAGNOSIS — M79605 Pain in left leg: Secondary | ICD-10-CM | POA: Diagnosis not present

## 2020-12-14 DIAGNOSIS — R260 Ataxic gait: Secondary | ICD-10-CM | POA: Diagnosis not present

## 2020-12-14 DIAGNOSIS — M6281 Muscle weakness (generalized): Secondary | ICD-10-CM | POA: Diagnosis not present

## 2020-12-15 DIAGNOSIS — R41841 Cognitive communication deficit: Secondary | ICD-10-CM | POA: Diagnosis not present

## 2020-12-15 DIAGNOSIS — M6281 Muscle weakness (generalized): Secondary | ICD-10-CM | POA: Diagnosis not present

## 2020-12-15 DIAGNOSIS — M79605 Pain in left leg: Secondary | ICD-10-CM | POA: Diagnosis not present

## 2020-12-15 DIAGNOSIS — R260 Ataxic gait: Secondary | ICD-10-CM | POA: Diagnosis not present

## 2020-12-15 DIAGNOSIS — R262 Difficulty in walking, not elsewhere classified: Secondary | ICD-10-CM | POA: Diagnosis not present

## 2020-12-15 DIAGNOSIS — M79604 Pain in right leg: Secondary | ICD-10-CM | POA: Diagnosis not present

## 2020-12-15 DIAGNOSIS — N3946 Mixed incontinence: Secondary | ICD-10-CM | POA: Diagnosis not present

## 2020-12-16 DIAGNOSIS — R260 Ataxic gait: Secondary | ICD-10-CM | POA: Diagnosis not present

## 2020-12-16 DIAGNOSIS — R262 Difficulty in walking, not elsewhere classified: Secondary | ICD-10-CM | POA: Diagnosis not present

## 2020-12-16 DIAGNOSIS — M79605 Pain in left leg: Secondary | ICD-10-CM | POA: Diagnosis not present

## 2020-12-16 DIAGNOSIS — N3946 Mixed incontinence: Secondary | ICD-10-CM | POA: Diagnosis not present

## 2020-12-16 DIAGNOSIS — M6281 Muscle weakness (generalized): Secondary | ICD-10-CM | POA: Diagnosis not present

## 2020-12-16 DIAGNOSIS — M79604 Pain in right leg: Secondary | ICD-10-CM | POA: Diagnosis not present

## 2020-12-16 DIAGNOSIS — R41841 Cognitive communication deficit: Secondary | ICD-10-CM | POA: Diagnosis not present

## 2020-12-17 DIAGNOSIS — R260 Ataxic gait: Secondary | ICD-10-CM | POA: Diagnosis not present

## 2020-12-17 DIAGNOSIS — N3946 Mixed incontinence: Secondary | ICD-10-CM | POA: Diagnosis not present

## 2020-12-17 DIAGNOSIS — R262 Difficulty in walking, not elsewhere classified: Secondary | ICD-10-CM | POA: Diagnosis not present

## 2020-12-17 DIAGNOSIS — M79604 Pain in right leg: Secondary | ICD-10-CM | POA: Diagnosis not present

## 2020-12-17 DIAGNOSIS — M79605 Pain in left leg: Secondary | ICD-10-CM | POA: Diagnosis not present

## 2020-12-17 DIAGNOSIS — R41841 Cognitive communication deficit: Secondary | ICD-10-CM | POA: Diagnosis not present

## 2020-12-17 DIAGNOSIS — M6281 Muscle weakness (generalized): Secondary | ICD-10-CM | POA: Diagnosis not present

## 2020-12-18 DIAGNOSIS — M79604 Pain in right leg: Secondary | ICD-10-CM | POA: Diagnosis not present

## 2020-12-18 DIAGNOSIS — R260 Ataxic gait: Secondary | ICD-10-CM | POA: Diagnosis not present

## 2020-12-18 DIAGNOSIS — R41841 Cognitive communication deficit: Secondary | ICD-10-CM | POA: Diagnosis not present

## 2020-12-18 DIAGNOSIS — M79605 Pain in left leg: Secondary | ICD-10-CM | POA: Diagnosis not present

## 2020-12-18 DIAGNOSIS — R262 Difficulty in walking, not elsewhere classified: Secondary | ICD-10-CM | POA: Diagnosis not present

## 2020-12-18 DIAGNOSIS — N3946 Mixed incontinence: Secondary | ICD-10-CM | POA: Diagnosis not present

## 2020-12-18 DIAGNOSIS — M6281 Muscle weakness (generalized): Secondary | ICD-10-CM | POA: Diagnosis not present

## 2020-12-21 DIAGNOSIS — M6281 Muscle weakness (generalized): Secondary | ICD-10-CM | POA: Diagnosis not present

## 2020-12-21 DIAGNOSIS — R41841 Cognitive communication deficit: Secondary | ICD-10-CM | POA: Diagnosis not present

## 2020-12-21 DIAGNOSIS — M79605 Pain in left leg: Secondary | ICD-10-CM | POA: Diagnosis not present

## 2020-12-21 DIAGNOSIS — N3946 Mixed incontinence: Secondary | ICD-10-CM | POA: Diagnosis not present

## 2020-12-21 DIAGNOSIS — R260 Ataxic gait: Secondary | ICD-10-CM | POA: Diagnosis not present

## 2020-12-21 DIAGNOSIS — M79604 Pain in right leg: Secondary | ICD-10-CM | POA: Diagnosis not present

## 2020-12-21 DIAGNOSIS — R262 Difficulty in walking, not elsewhere classified: Secondary | ICD-10-CM | POA: Diagnosis not present

## 2020-12-22 DIAGNOSIS — R0781 Pleurodynia: Secondary | ICD-10-CM | POA: Diagnosis not present

## 2020-12-22 DIAGNOSIS — R262 Difficulty in walking, not elsewhere classified: Secondary | ICD-10-CM | POA: Diagnosis not present

## 2020-12-22 DIAGNOSIS — R42 Dizziness and giddiness: Secondary | ICD-10-CM | POA: Diagnosis not present

## 2020-12-22 DIAGNOSIS — M6281 Muscle weakness (generalized): Secondary | ICD-10-CM | POA: Diagnosis not present

## 2020-12-22 DIAGNOSIS — G238 Other specified degenerative diseases of basal ganglia: Secondary | ICD-10-CM | POA: Diagnosis not present

## 2020-12-22 DIAGNOSIS — R82998 Other abnormal findings in urine: Secondary | ICD-10-CM | POA: Diagnosis not present

## 2020-12-22 DIAGNOSIS — W19XXXA Unspecified fall, initial encounter: Secondary | ICD-10-CM | POA: Diagnosis not present

## 2020-12-22 DIAGNOSIS — J9811 Atelectasis: Secondary | ICD-10-CM | POA: Diagnosis not present

## 2020-12-22 DIAGNOSIS — N3946 Mixed incontinence: Secondary | ICD-10-CM | POA: Diagnosis not present

## 2020-12-22 DIAGNOSIS — R41841 Cognitive communication deficit: Secondary | ICD-10-CM | POA: Diagnosis not present

## 2020-12-22 DIAGNOSIS — S2231XA Fracture of one rib, right side, initial encounter for closed fracture: Secondary | ICD-10-CM | POA: Diagnosis not present

## 2020-12-22 DIAGNOSIS — G9389 Other specified disorders of brain: Secondary | ICD-10-CM | POA: Diagnosis not present

## 2020-12-22 DIAGNOSIS — W1839XA Other fall on same level, initial encounter: Secondary | ICD-10-CM | POA: Diagnosis not present

## 2020-12-22 DIAGNOSIS — Y999 Unspecified external cause status: Secondary | ICD-10-CM | POA: Diagnosis not present

## 2020-12-22 DIAGNOSIS — M79605 Pain in left leg: Secondary | ICD-10-CM | POA: Diagnosis not present

## 2020-12-22 DIAGNOSIS — R260 Ataxic gait: Secondary | ICD-10-CM | POA: Diagnosis not present

## 2020-12-22 DIAGNOSIS — M79604 Pain in right leg: Secondary | ICD-10-CM | POA: Diagnosis not present

## 2020-12-22 DIAGNOSIS — I639 Cerebral infarction, unspecified: Secondary | ICD-10-CM | POA: Diagnosis not present

## 2020-12-22 DIAGNOSIS — I10 Essential (primary) hypertension: Secondary | ICD-10-CM | POA: Diagnosis not present

## 2020-12-23 DIAGNOSIS — M6281 Muscle weakness (generalized): Secondary | ICD-10-CM | POA: Diagnosis not present

## 2020-12-23 DIAGNOSIS — I639 Cerebral infarction, unspecified: Secondary | ICD-10-CM | POA: Diagnosis not present

## 2020-12-23 DIAGNOSIS — J9811 Atelectasis: Secondary | ICD-10-CM | POA: Diagnosis not present

## 2020-12-23 DIAGNOSIS — N3946 Mixed incontinence: Secondary | ICD-10-CM | POA: Diagnosis not present

## 2020-12-23 DIAGNOSIS — G238 Other specified degenerative diseases of basal ganglia: Secondary | ICD-10-CM | POA: Diagnosis not present

## 2020-12-23 DIAGNOSIS — R260 Ataxic gait: Secondary | ICD-10-CM | POA: Diagnosis not present

## 2020-12-23 DIAGNOSIS — R079 Chest pain, unspecified: Secondary | ICD-10-CM | POA: Diagnosis not present

## 2020-12-23 DIAGNOSIS — Z743 Need for continuous supervision: Secondary | ICD-10-CM | POA: Diagnosis not present

## 2020-12-23 DIAGNOSIS — M79604 Pain in right leg: Secondary | ICD-10-CM | POA: Diagnosis not present

## 2020-12-23 DIAGNOSIS — R42 Dizziness and giddiness: Secondary | ICD-10-CM | POA: Diagnosis not present

## 2020-12-23 DIAGNOSIS — R262 Difficulty in walking, not elsewhere classified: Secondary | ICD-10-CM | POA: Diagnosis not present

## 2020-12-23 DIAGNOSIS — R0781 Pleurodynia: Secondary | ICD-10-CM | POA: Diagnosis not present

## 2020-12-23 DIAGNOSIS — M79605 Pain in left leg: Secondary | ICD-10-CM | POA: Diagnosis not present

## 2020-12-23 DIAGNOSIS — R41841 Cognitive communication deficit: Secondary | ICD-10-CM | POA: Diagnosis not present

## 2020-12-23 DIAGNOSIS — G9389 Other specified disorders of brain: Secondary | ICD-10-CM | POA: Diagnosis not present

## 2020-12-24 DIAGNOSIS — R262 Difficulty in walking, not elsewhere classified: Secondary | ICD-10-CM | POA: Diagnosis not present

## 2020-12-24 DIAGNOSIS — M79604 Pain in right leg: Secondary | ICD-10-CM | POA: Diagnosis not present

## 2020-12-24 DIAGNOSIS — M79605 Pain in left leg: Secondary | ICD-10-CM | POA: Diagnosis not present

## 2020-12-24 DIAGNOSIS — M6281 Muscle weakness (generalized): Secondary | ICD-10-CM | POA: Diagnosis not present

## 2020-12-24 DIAGNOSIS — R260 Ataxic gait: Secondary | ICD-10-CM | POA: Diagnosis not present

## 2020-12-24 DIAGNOSIS — R41841 Cognitive communication deficit: Secondary | ICD-10-CM | POA: Diagnosis not present

## 2020-12-24 DIAGNOSIS — N3946 Mixed incontinence: Secondary | ICD-10-CM | POA: Diagnosis not present

## 2020-12-28 DIAGNOSIS — M79604 Pain in right leg: Secondary | ICD-10-CM | POA: Diagnosis not present

## 2020-12-28 DIAGNOSIS — R41841 Cognitive communication deficit: Secondary | ICD-10-CM | POA: Diagnosis not present

## 2020-12-28 DIAGNOSIS — N3946 Mixed incontinence: Secondary | ICD-10-CM | POA: Diagnosis not present

## 2020-12-28 DIAGNOSIS — M79605 Pain in left leg: Secondary | ICD-10-CM | POA: Diagnosis not present

## 2020-12-28 DIAGNOSIS — R262 Difficulty in walking, not elsewhere classified: Secondary | ICD-10-CM | POA: Diagnosis not present

## 2020-12-28 DIAGNOSIS — R260 Ataxic gait: Secondary | ICD-10-CM | POA: Diagnosis not present

## 2020-12-28 DIAGNOSIS — M6281 Muscle weakness (generalized): Secondary | ICD-10-CM | POA: Diagnosis not present

## 2020-12-29 DIAGNOSIS — W19XXXD Unspecified fall, subsequent encounter: Secondary | ICD-10-CM | POA: Diagnosis not present

## 2020-12-29 DIAGNOSIS — S2231XD Fracture of one rib, right side, subsequent encounter for fracture with routine healing: Secondary | ICD-10-CM | POA: Diagnosis not present

## 2020-12-29 DIAGNOSIS — I10 Essential (primary) hypertension: Secondary | ICD-10-CM | POA: Diagnosis not present

## 2020-12-29 DIAGNOSIS — R41841 Cognitive communication deficit: Secondary | ICD-10-CM | POA: Diagnosis not present

## 2020-12-29 DIAGNOSIS — R260 Ataxic gait: Secondary | ICD-10-CM | POA: Diagnosis not present

## 2020-12-29 DIAGNOSIS — Z09 Encounter for follow-up examination after completed treatment for conditions other than malignant neoplasm: Secondary | ICD-10-CM | POA: Diagnosis not present

## 2020-12-29 DIAGNOSIS — R42 Dizziness and giddiness: Secondary | ICD-10-CM | POA: Diagnosis not present

## 2020-12-29 DIAGNOSIS — R262 Difficulty in walking, not elsewhere classified: Secondary | ICD-10-CM | POA: Diagnosis not present

## 2020-12-29 DIAGNOSIS — M6281 Muscle weakness (generalized): Secondary | ICD-10-CM | POA: Diagnosis not present

## 2020-12-29 DIAGNOSIS — N3946 Mixed incontinence: Secondary | ICD-10-CM | POA: Diagnosis not present

## 2020-12-29 DIAGNOSIS — M79604 Pain in right leg: Secondary | ICD-10-CM | POA: Diagnosis not present

## 2020-12-29 DIAGNOSIS — M79605 Pain in left leg: Secondary | ICD-10-CM | POA: Diagnosis not present

## 2020-12-29 DIAGNOSIS — R410 Disorientation, unspecified: Secondary | ICD-10-CM | POA: Diagnosis not present

## 2020-12-30 DIAGNOSIS — R262 Difficulty in walking, not elsewhere classified: Secondary | ICD-10-CM | POA: Diagnosis not present

## 2020-12-30 DIAGNOSIS — R41841 Cognitive communication deficit: Secondary | ICD-10-CM | POA: Diagnosis not present

## 2020-12-30 DIAGNOSIS — M79604 Pain in right leg: Secondary | ICD-10-CM | POA: Diagnosis not present

## 2020-12-30 DIAGNOSIS — M79605 Pain in left leg: Secondary | ICD-10-CM | POA: Diagnosis not present

## 2020-12-30 DIAGNOSIS — R260 Ataxic gait: Secondary | ICD-10-CM | POA: Diagnosis not present

## 2020-12-30 DIAGNOSIS — N3946 Mixed incontinence: Secondary | ICD-10-CM | POA: Diagnosis not present

## 2020-12-30 DIAGNOSIS — M6281 Muscle weakness (generalized): Secondary | ICD-10-CM | POA: Diagnosis not present

## 2020-12-31 DIAGNOSIS — M6281 Muscle weakness (generalized): Secondary | ICD-10-CM | POA: Diagnosis not present

## 2020-12-31 DIAGNOSIS — M79604 Pain in right leg: Secondary | ICD-10-CM | POA: Diagnosis not present

## 2020-12-31 DIAGNOSIS — M79605 Pain in left leg: Secondary | ICD-10-CM | POA: Diagnosis not present

## 2020-12-31 DIAGNOSIS — R260 Ataxic gait: Secondary | ICD-10-CM | POA: Diagnosis not present

## 2020-12-31 DIAGNOSIS — R262 Difficulty in walking, not elsewhere classified: Secondary | ICD-10-CM | POA: Diagnosis not present

## 2020-12-31 DIAGNOSIS — N3946 Mixed incontinence: Secondary | ICD-10-CM | POA: Diagnosis not present

## 2020-12-31 DIAGNOSIS — R41841 Cognitive communication deficit: Secondary | ICD-10-CM | POA: Diagnosis not present

## 2021-01-02 DIAGNOSIS — M79605 Pain in left leg: Secondary | ICD-10-CM | POA: Diagnosis not present

## 2021-01-02 DIAGNOSIS — M79604 Pain in right leg: Secondary | ICD-10-CM | POA: Diagnosis not present

## 2021-01-02 DIAGNOSIS — R262 Difficulty in walking, not elsewhere classified: Secondary | ICD-10-CM | POA: Diagnosis not present

## 2021-01-02 DIAGNOSIS — R260 Ataxic gait: Secondary | ICD-10-CM | POA: Diagnosis not present

## 2021-01-02 DIAGNOSIS — N3946 Mixed incontinence: Secondary | ICD-10-CM | POA: Diagnosis not present

## 2021-01-02 DIAGNOSIS — R41841 Cognitive communication deficit: Secondary | ICD-10-CM | POA: Diagnosis not present

## 2021-01-02 DIAGNOSIS — M6281 Muscle weakness (generalized): Secondary | ICD-10-CM | POA: Diagnosis not present

## 2021-01-03 DIAGNOSIS — M79605 Pain in left leg: Secondary | ICD-10-CM | POA: Diagnosis not present

## 2021-01-03 DIAGNOSIS — M6281 Muscle weakness (generalized): Secondary | ICD-10-CM | POA: Diagnosis not present

## 2021-01-03 DIAGNOSIS — R41841 Cognitive communication deficit: Secondary | ICD-10-CM | POA: Diagnosis not present

## 2021-01-03 DIAGNOSIS — R260 Ataxic gait: Secondary | ICD-10-CM | POA: Diagnosis not present

## 2021-01-03 DIAGNOSIS — M79604 Pain in right leg: Secondary | ICD-10-CM | POA: Diagnosis not present

## 2021-01-03 DIAGNOSIS — R262 Difficulty in walking, not elsewhere classified: Secondary | ICD-10-CM | POA: Diagnosis not present

## 2021-01-03 DIAGNOSIS — N3946 Mixed incontinence: Secondary | ICD-10-CM | POA: Diagnosis not present

## 2021-01-05 DIAGNOSIS — N3946 Mixed incontinence: Secondary | ICD-10-CM | POA: Diagnosis not present

## 2021-01-05 DIAGNOSIS — R260 Ataxic gait: Secondary | ICD-10-CM | POA: Diagnosis not present

## 2021-01-05 DIAGNOSIS — R262 Difficulty in walking, not elsewhere classified: Secondary | ICD-10-CM | POA: Diagnosis not present

## 2021-01-05 DIAGNOSIS — M79604 Pain in right leg: Secondary | ICD-10-CM | POA: Diagnosis not present

## 2021-01-05 DIAGNOSIS — M6281 Muscle weakness (generalized): Secondary | ICD-10-CM | POA: Diagnosis not present

## 2021-01-05 DIAGNOSIS — M79605 Pain in left leg: Secondary | ICD-10-CM | POA: Diagnosis not present

## 2021-01-05 DIAGNOSIS — R41841 Cognitive communication deficit: Secondary | ICD-10-CM | POA: Diagnosis not present

## 2021-01-06 DIAGNOSIS — M79604 Pain in right leg: Secondary | ICD-10-CM | POA: Diagnosis not present

## 2021-01-06 DIAGNOSIS — R262 Difficulty in walking, not elsewhere classified: Secondary | ICD-10-CM | POA: Diagnosis not present

## 2021-01-06 DIAGNOSIS — R41841 Cognitive communication deficit: Secondary | ICD-10-CM | POA: Diagnosis not present

## 2021-01-06 DIAGNOSIS — M79605 Pain in left leg: Secondary | ICD-10-CM | POA: Diagnosis not present

## 2021-01-06 DIAGNOSIS — M6281 Muscle weakness (generalized): Secondary | ICD-10-CM | POA: Diagnosis not present

## 2021-01-06 DIAGNOSIS — R260 Ataxic gait: Secondary | ICD-10-CM | POA: Diagnosis not present

## 2021-01-06 DIAGNOSIS — N3946 Mixed incontinence: Secondary | ICD-10-CM | POA: Diagnosis not present

## 2021-01-08 DIAGNOSIS — M79604 Pain in right leg: Secondary | ICD-10-CM | POA: Diagnosis not present

## 2021-01-08 DIAGNOSIS — R262 Difficulty in walking, not elsewhere classified: Secondary | ICD-10-CM | POA: Diagnosis not present

## 2021-01-08 DIAGNOSIS — N3946 Mixed incontinence: Secondary | ICD-10-CM | POA: Diagnosis not present

## 2021-01-08 DIAGNOSIS — M79605 Pain in left leg: Secondary | ICD-10-CM | POA: Diagnosis not present

## 2021-01-08 DIAGNOSIS — M6281 Muscle weakness (generalized): Secondary | ICD-10-CM | POA: Diagnosis not present

## 2021-01-08 DIAGNOSIS — R260 Ataxic gait: Secondary | ICD-10-CM | POA: Diagnosis not present

## 2021-01-08 DIAGNOSIS — R41841 Cognitive communication deficit: Secondary | ICD-10-CM | POA: Diagnosis not present

## 2021-01-09 DIAGNOSIS — M79605 Pain in left leg: Secondary | ICD-10-CM | POA: Diagnosis not present

## 2021-01-09 DIAGNOSIS — M79604 Pain in right leg: Secondary | ICD-10-CM | POA: Diagnosis not present

## 2021-01-09 DIAGNOSIS — M6281 Muscle weakness (generalized): Secondary | ICD-10-CM | POA: Diagnosis not present

## 2021-01-09 DIAGNOSIS — R260 Ataxic gait: Secondary | ICD-10-CM | POA: Diagnosis not present

## 2021-01-09 DIAGNOSIS — N3946 Mixed incontinence: Secondary | ICD-10-CM | POA: Diagnosis not present

## 2021-01-09 DIAGNOSIS — R262 Difficulty in walking, not elsewhere classified: Secondary | ICD-10-CM | POA: Diagnosis not present

## 2021-01-09 DIAGNOSIS — R41841 Cognitive communication deficit: Secondary | ICD-10-CM | POA: Diagnosis not present

## 2021-01-11 ENCOUNTER — Other Ambulatory Visit: Payer: Self-pay

## 2021-01-11 ENCOUNTER — Telehealth: Payer: Self-pay | Admitting: Psychiatry

## 2021-01-11 ENCOUNTER — Ambulatory Visit: Payer: PPO | Admitting: Podiatry

## 2021-01-11 DIAGNOSIS — M79605 Pain in left leg: Secondary | ICD-10-CM | POA: Diagnosis not present

## 2021-01-11 DIAGNOSIS — M6281 Muscle weakness (generalized): Secondary | ICD-10-CM | POA: Diagnosis not present

## 2021-01-11 DIAGNOSIS — R260 Ataxic gait: Secondary | ICD-10-CM | POA: Diagnosis not present

## 2021-01-11 DIAGNOSIS — R41841 Cognitive communication deficit: Secondary | ICD-10-CM | POA: Diagnosis not present

## 2021-01-11 DIAGNOSIS — R262 Difficulty in walking, not elsewhere classified: Secondary | ICD-10-CM | POA: Diagnosis not present

## 2021-01-11 DIAGNOSIS — M79604 Pain in right leg: Secondary | ICD-10-CM | POA: Diagnosis not present

## 2021-01-11 DIAGNOSIS — N3946 Mixed incontinence: Secondary | ICD-10-CM | POA: Diagnosis not present

## 2021-01-11 MED ORDER — TRAZODONE HCL 50 MG PO TABS: 50.0000 mg | ORAL_TABLET | Freq: Every day | ORAL | 0 refills | Status: AC

## 2021-01-11 NOTE — Telephone Encounter (Signed)
Pt called requesting refill for Trazodone at new pharmacy location. [employer] [place] # [phone]. Pt contact # [phone]. APT 11/8

## 2021-01-11 NOTE — Telephone Encounter (Signed)
Rx sent 

## 2021-01-12 DIAGNOSIS — I10 Essential (primary) hypertension: Secondary | ICD-10-CM | POA: Diagnosis not present

## 2021-01-12 DIAGNOSIS — R42 Dizziness and giddiness: Secondary | ICD-10-CM | POA: Diagnosis not present

## 2021-01-13 DIAGNOSIS — R41841 Cognitive communication deficit: Secondary | ICD-10-CM | POA: Diagnosis not present

## 2021-01-13 DIAGNOSIS — R262 Difficulty in walking, not elsewhere classified: Secondary | ICD-10-CM | POA: Diagnosis not present

## 2021-01-13 DIAGNOSIS — N3946 Mixed incontinence: Secondary | ICD-10-CM | POA: Diagnosis not present

## 2021-01-13 DIAGNOSIS — M79604 Pain in right leg: Secondary | ICD-10-CM | POA: Diagnosis not present

## 2021-01-13 DIAGNOSIS — M6281 Muscle weakness (generalized): Secondary | ICD-10-CM | POA: Diagnosis not present

## 2021-01-13 DIAGNOSIS — R260 Ataxic gait: Secondary | ICD-10-CM | POA: Diagnosis not present

## 2021-01-13 DIAGNOSIS — M79605 Pain in left leg: Secondary | ICD-10-CM | POA: Diagnosis not present

## 2021-01-14 DIAGNOSIS — R41841 Cognitive communication deficit: Secondary | ICD-10-CM | POA: Diagnosis not present

## 2021-01-14 DIAGNOSIS — R260 Ataxic gait: Secondary | ICD-10-CM | POA: Diagnosis not present

## 2021-01-14 DIAGNOSIS — N3946 Mixed incontinence: Secondary | ICD-10-CM | POA: Diagnosis not present

## 2021-01-14 DIAGNOSIS — M79605 Pain in left leg: Secondary | ICD-10-CM | POA: Diagnosis not present

## 2021-01-14 DIAGNOSIS — M79604 Pain in right leg: Secondary | ICD-10-CM | POA: Diagnosis not present

## 2021-01-14 DIAGNOSIS — R262 Difficulty in walking, not elsewhere classified: Secondary | ICD-10-CM | POA: Diagnosis not present

## 2021-01-14 DIAGNOSIS — M6281 Muscle weakness (generalized): Secondary | ICD-10-CM | POA: Diagnosis not present

## 2021-01-15 DIAGNOSIS — M79605 Pain in left leg: Secondary | ICD-10-CM | POA: Diagnosis not present

## 2021-01-15 DIAGNOSIS — N3946 Mixed incontinence: Secondary | ICD-10-CM | POA: Diagnosis not present

## 2021-01-15 DIAGNOSIS — R262 Difficulty in walking, not elsewhere classified: Secondary | ICD-10-CM | POA: Diagnosis not present

## 2021-01-15 DIAGNOSIS — R41841 Cognitive communication deficit: Secondary | ICD-10-CM | POA: Diagnosis not present

## 2021-01-15 DIAGNOSIS — M6281 Muscle weakness (generalized): Secondary | ICD-10-CM | POA: Diagnosis not present

## 2021-01-15 DIAGNOSIS — M79604 Pain in right leg: Secondary | ICD-10-CM | POA: Diagnosis not present

## 2021-01-15 DIAGNOSIS — R260 Ataxic gait: Secondary | ICD-10-CM | POA: Diagnosis not present

## 2021-01-16 DIAGNOSIS — M6281 Muscle weakness (generalized): Secondary | ICD-10-CM | POA: Diagnosis not present

## 2021-01-16 DIAGNOSIS — R260 Ataxic gait: Secondary | ICD-10-CM | POA: Diagnosis not present

## 2021-01-16 DIAGNOSIS — R262 Difficulty in walking, not elsewhere classified: Secondary | ICD-10-CM | POA: Diagnosis not present

## 2021-01-16 DIAGNOSIS — M79605 Pain in left leg: Secondary | ICD-10-CM | POA: Diagnosis not present

## 2021-01-16 DIAGNOSIS — M79604 Pain in right leg: Secondary | ICD-10-CM | POA: Diagnosis not present

## 2021-01-16 DIAGNOSIS — R41841 Cognitive communication deficit: Secondary | ICD-10-CM | POA: Diagnosis not present

## 2021-01-16 DIAGNOSIS — N3946 Mixed incontinence: Secondary | ICD-10-CM | POA: Diagnosis not present

## 2021-01-17 DIAGNOSIS — R41841 Cognitive communication deficit: Secondary | ICD-10-CM | POA: Diagnosis not present

## 2021-01-17 DIAGNOSIS — R260 Ataxic gait: Secondary | ICD-10-CM | POA: Diagnosis not present

## 2021-01-17 DIAGNOSIS — N3946 Mixed incontinence: Secondary | ICD-10-CM | POA: Diagnosis not present

## 2021-01-17 DIAGNOSIS — R262 Difficulty in walking, not elsewhere classified: Secondary | ICD-10-CM | POA: Diagnosis not present

## 2021-01-17 DIAGNOSIS — M6281 Muscle weakness (generalized): Secondary | ICD-10-CM | POA: Diagnosis not present

## 2021-01-17 DIAGNOSIS — M79605 Pain in left leg: Secondary | ICD-10-CM | POA: Diagnosis not present

## 2021-01-17 DIAGNOSIS — M79604 Pain in right leg: Secondary | ICD-10-CM | POA: Diagnosis not present

## 2021-01-18 DIAGNOSIS — M6281 Muscle weakness (generalized): Secondary | ICD-10-CM | POA: Diagnosis not present

## 2021-01-18 DIAGNOSIS — M79605 Pain in left leg: Secondary | ICD-10-CM | POA: Diagnosis not present

## 2021-01-18 DIAGNOSIS — N3946 Mixed incontinence: Secondary | ICD-10-CM | POA: Diagnosis not present

## 2021-01-18 DIAGNOSIS — R262 Difficulty in walking, not elsewhere classified: Secondary | ICD-10-CM | POA: Diagnosis not present

## 2021-01-18 DIAGNOSIS — R260 Ataxic gait: Secondary | ICD-10-CM | POA: Diagnosis not present

## 2021-01-18 DIAGNOSIS — M79604 Pain in right leg: Secondary | ICD-10-CM | POA: Diagnosis not present

## 2021-01-18 DIAGNOSIS — R41841 Cognitive communication deficit: Secondary | ICD-10-CM | POA: Diagnosis not present

## 2021-01-19 DIAGNOSIS — M79604 Pain in right leg: Secondary | ICD-10-CM | POA: Diagnosis not present

## 2021-01-19 DIAGNOSIS — R41841 Cognitive communication deficit: Secondary | ICD-10-CM | POA: Diagnosis not present

## 2021-01-19 DIAGNOSIS — R262 Difficulty in walking, not elsewhere classified: Secondary | ICD-10-CM | POA: Diagnosis not present

## 2021-01-19 DIAGNOSIS — N3946 Mixed incontinence: Secondary | ICD-10-CM | POA: Diagnosis not present

## 2021-01-19 DIAGNOSIS — M79605 Pain in left leg: Secondary | ICD-10-CM | POA: Diagnosis not present

## 2021-01-19 DIAGNOSIS — R260 Ataxic gait: Secondary | ICD-10-CM | POA: Diagnosis not present

## 2021-01-19 DIAGNOSIS — M6281 Muscle weakness (generalized): Secondary | ICD-10-CM | POA: Diagnosis not present

## 2021-01-20 DIAGNOSIS — R260 Ataxic gait: Secondary | ICD-10-CM | POA: Diagnosis not present

## 2021-01-20 DIAGNOSIS — M6281 Muscle weakness (generalized): Secondary | ICD-10-CM | POA: Diagnosis not present

## 2021-01-20 DIAGNOSIS — M79605 Pain in left leg: Secondary | ICD-10-CM | POA: Diagnosis not present

## 2021-01-20 DIAGNOSIS — R41841 Cognitive communication deficit: Secondary | ICD-10-CM | POA: Diagnosis not present

## 2021-01-20 DIAGNOSIS — M79604 Pain in right leg: Secondary | ICD-10-CM | POA: Diagnosis not present

## 2021-01-20 DIAGNOSIS — R262 Difficulty in walking, not elsewhere classified: Secondary | ICD-10-CM | POA: Diagnosis not present

## 2021-01-20 DIAGNOSIS — N3946 Mixed incontinence: Secondary | ICD-10-CM | POA: Diagnosis not present

## 2021-01-21 DIAGNOSIS — R41841 Cognitive communication deficit: Secondary | ICD-10-CM | POA: Diagnosis not present

## 2021-01-21 DIAGNOSIS — M6281 Muscle weakness (generalized): Secondary | ICD-10-CM | POA: Diagnosis not present

## 2021-01-21 DIAGNOSIS — M79605 Pain in left leg: Secondary | ICD-10-CM | POA: Diagnosis not present

## 2021-01-21 DIAGNOSIS — M79604 Pain in right leg: Secondary | ICD-10-CM | POA: Diagnosis not present

## 2021-01-21 DIAGNOSIS — N3946 Mixed incontinence: Secondary | ICD-10-CM | POA: Diagnosis not present

## 2021-01-21 DIAGNOSIS — R262 Difficulty in walking, not elsewhere classified: Secondary | ICD-10-CM | POA: Diagnosis not present

## 2021-01-21 DIAGNOSIS — R260 Ataxic gait: Secondary | ICD-10-CM | POA: Diagnosis not present

## 2021-01-22 DIAGNOSIS — R260 Ataxic gait: Secondary | ICD-10-CM | POA: Diagnosis not present

## 2021-01-22 DIAGNOSIS — M6281 Muscle weakness (generalized): Secondary | ICD-10-CM | POA: Diagnosis not present

## 2021-01-22 DIAGNOSIS — M79605 Pain in left leg: Secondary | ICD-10-CM | POA: Diagnosis not present

## 2021-01-22 DIAGNOSIS — M79604 Pain in right leg: Secondary | ICD-10-CM | POA: Diagnosis not present

## 2021-01-22 DIAGNOSIS — R262 Difficulty in walking, not elsewhere classified: Secondary | ICD-10-CM | POA: Diagnosis not present

## 2021-01-22 DIAGNOSIS — N3946 Mixed incontinence: Secondary | ICD-10-CM | POA: Diagnosis not present

## 2021-01-22 DIAGNOSIS — R41841 Cognitive communication deficit: Secondary | ICD-10-CM | POA: Diagnosis not present

## 2021-01-23 DIAGNOSIS — M6281 Muscle weakness (generalized): Secondary | ICD-10-CM | POA: Diagnosis not present

## 2021-01-23 DIAGNOSIS — R41841 Cognitive communication deficit: Secondary | ICD-10-CM | POA: Diagnosis not present

## 2021-01-23 DIAGNOSIS — M79604 Pain in right leg: Secondary | ICD-10-CM | POA: Diagnosis not present

## 2021-01-23 DIAGNOSIS — N3946 Mixed incontinence: Secondary | ICD-10-CM | POA: Diagnosis not present

## 2021-01-23 DIAGNOSIS — R262 Difficulty in walking, not elsewhere classified: Secondary | ICD-10-CM | POA: Diagnosis not present

## 2021-01-23 DIAGNOSIS — R260 Ataxic gait: Secondary | ICD-10-CM | POA: Diagnosis not present

## 2021-01-23 DIAGNOSIS — M79605 Pain in left leg: Secondary | ICD-10-CM | POA: Diagnosis not present

## 2021-01-24 DIAGNOSIS — M79605 Pain in left leg: Secondary | ICD-10-CM | POA: Diagnosis not present

## 2021-01-24 DIAGNOSIS — R260 Ataxic gait: Secondary | ICD-10-CM | POA: Diagnosis not present

## 2021-01-24 DIAGNOSIS — N3946 Mixed incontinence: Secondary | ICD-10-CM | POA: Diagnosis not present

## 2021-01-24 DIAGNOSIS — R262 Difficulty in walking, not elsewhere classified: Secondary | ICD-10-CM | POA: Diagnosis not present

## 2021-01-24 DIAGNOSIS — M79604 Pain in right leg: Secondary | ICD-10-CM | POA: Diagnosis not present

## 2021-01-24 DIAGNOSIS — R41841 Cognitive communication deficit: Secondary | ICD-10-CM | POA: Diagnosis not present

## 2021-01-24 DIAGNOSIS — M6281 Muscle weakness (generalized): Secondary | ICD-10-CM | POA: Diagnosis not present

## 2021-01-25 DIAGNOSIS — M79605 Pain in left leg: Secondary | ICD-10-CM | POA: Diagnosis not present

## 2021-01-25 DIAGNOSIS — R41841 Cognitive communication deficit: Secondary | ICD-10-CM | POA: Diagnosis not present

## 2021-01-25 DIAGNOSIS — R262 Difficulty in walking, not elsewhere classified: Secondary | ICD-10-CM | POA: Diagnosis not present

## 2021-01-25 DIAGNOSIS — M6281 Muscle weakness (generalized): Secondary | ICD-10-CM | POA: Diagnosis not present

## 2021-01-25 DIAGNOSIS — N3946 Mixed incontinence: Secondary | ICD-10-CM | POA: Diagnosis not present

## 2021-01-25 DIAGNOSIS — R260 Ataxic gait: Secondary | ICD-10-CM | POA: Diagnosis not present

## 2021-01-25 DIAGNOSIS — M79604 Pain in right leg: Secondary | ICD-10-CM | POA: Diagnosis not present

## 2021-01-26 ENCOUNTER — Ambulatory Visit: Payer: PPO | Admitting: Psychiatry

## 2021-01-26 DIAGNOSIS — R41841 Cognitive communication deficit: Secondary | ICD-10-CM | POA: Diagnosis not present

## 2021-01-26 DIAGNOSIS — M6281 Muscle weakness (generalized): Secondary | ICD-10-CM | POA: Diagnosis not present

## 2021-01-26 DIAGNOSIS — M79605 Pain in left leg: Secondary | ICD-10-CM | POA: Diagnosis not present

## 2021-01-26 DIAGNOSIS — N3946 Mixed incontinence: Secondary | ICD-10-CM | POA: Diagnosis not present

## 2021-01-26 DIAGNOSIS — M79604 Pain in right leg: Secondary | ICD-10-CM | POA: Diagnosis not present

## 2021-01-26 DIAGNOSIS — R260 Ataxic gait: Secondary | ICD-10-CM | POA: Diagnosis not present

## 2021-01-26 DIAGNOSIS — R262 Difficulty in walking, not elsewhere classified: Secondary | ICD-10-CM | POA: Diagnosis not present

## 2021-01-27 DIAGNOSIS — R260 Ataxic gait: Secondary | ICD-10-CM | POA: Diagnosis not present

## 2021-01-27 DIAGNOSIS — M6281 Muscle weakness (generalized): Secondary | ICD-10-CM | POA: Diagnosis not present

## 2021-01-27 DIAGNOSIS — N3946 Mixed incontinence: Secondary | ICD-10-CM | POA: Diagnosis not present

## 2021-01-27 DIAGNOSIS — R41841 Cognitive communication deficit: Secondary | ICD-10-CM | POA: Diagnosis not present

## 2021-01-27 DIAGNOSIS — M79604 Pain in right leg: Secondary | ICD-10-CM | POA: Diagnosis not present

## 2021-01-27 DIAGNOSIS — R262 Difficulty in walking, not elsewhere classified: Secondary | ICD-10-CM | POA: Diagnosis not present

## 2021-01-27 DIAGNOSIS — M79605 Pain in left leg: Secondary | ICD-10-CM | POA: Diagnosis not present

## 2021-01-27 DIAGNOSIS — G4733 Obstructive sleep apnea (adult) (pediatric): Secondary | ICD-10-CM | POA: Diagnosis not present

## 2021-01-28 DIAGNOSIS — R41841 Cognitive communication deficit: Secondary | ICD-10-CM | POA: Diagnosis not present

## 2021-01-28 DIAGNOSIS — M79604 Pain in right leg: Secondary | ICD-10-CM | POA: Diagnosis not present

## 2021-01-28 DIAGNOSIS — R262 Difficulty in walking, not elsewhere classified: Secondary | ICD-10-CM | POA: Diagnosis not present

## 2021-01-28 DIAGNOSIS — R260 Ataxic gait: Secondary | ICD-10-CM | POA: Diagnosis not present

## 2021-01-28 DIAGNOSIS — N3946 Mixed incontinence: Secondary | ICD-10-CM | POA: Diagnosis not present

## 2021-01-28 DIAGNOSIS — M6281 Muscle weakness (generalized): Secondary | ICD-10-CM | POA: Diagnosis not present

## 2021-01-28 DIAGNOSIS — M79605 Pain in left leg: Secondary | ICD-10-CM | POA: Diagnosis not present

## 2021-01-29 DIAGNOSIS — R262 Difficulty in walking, not elsewhere classified: Secondary | ICD-10-CM | POA: Diagnosis not present

## 2021-01-29 DIAGNOSIS — M79605 Pain in left leg: Secondary | ICD-10-CM | POA: Diagnosis not present

## 2021-01-29 DIAGNOSIS — R41841 Cognitive communication deficit: Secondary | ICD-10-CM | POA: Diagnosis not present

## 2021-01-29 DIAGNOSIS — M79604 Pain in right leg: Secondary | ICD-10-CM | POA: Diagnosis not present

## 2021-01-29 DIAGNOSIS — N3946 Mixed incontinence: Secondary | ICD-10-CM | POA: Diagnosis not present

## 2021-01-29 DIAGNOSIS — R260 Ataxic gait: Secondary | ICD-10-CM | POA: Diagnosis not present

## 2021-01-29 DIAGNOSIS — M6281 Muscle weakness (generalized): Secondary | ICD-10-CM | POA: Diagnosis not present

## 2021-01-31 DIAGNOSIS — N3946 Mixed incontinence: Secondary | ICD-10-CM | POA: Diagnosis not present

## 2021-01-31 DIAGNOSIS — M79604 Pain in right leg: Secondary | ICD-10-CM | POA: Diagnosis not present

## 2021-01-31 DIAGNOSIS — M79605 Pain in left leg: Secondary | ICD-10-CM | POA: Diagnosis not present

## 2021-01-31 DIAGNOSIS — R260 Ataxic gait: Secondary | ICD-10-CM | POA: Diagnosis not present

## 2021-01-31 DIAGNOSIS — R262 Difficulty in walking, not elsewhere classified: Secondary | ICD-10-CM | POA: Diagnosis not present

## 2021-01-31 DIAGNOSIS — M6281 Muscle weakness (generalized): Secondary | ICD-10-CM | POA: Diagnosis not present

## 2021-01-31 DIAGNOSIS — R41841 Cognitive communication deficit: Secondary | ICD-10-CM | POA: Diagnosis not present

## 2021-02-01 DIAGNOSIS — M79604 Pain in right leg: Secondary | ICD-10-CM | POA: Diagnosis not present

## 2021-02-01 DIAGNOSIS — R41841 Cognitive communication deficit: Secondary | ICD-10-CM | POA: Diagnosis not present

## 2021-02-01 DIAGNOSIS — M79605 Pain in left leg: Secondary | ICD-10-CM | POA: Diagnosis not present

## 2021-02-01 DIAGNOSIS — R260 Ataxic gait: Secondary | ICD-10-CM | POA: Diagnosis not present

## 2021-02-01 DIAGNOSIS — R262 Difficulty in walking, not elsewhere classified: Secondary | ICD-10-CM | POA: Diagnosis not present

## 2021-02-01 DIAGNOSIS — N3946 Mixed incontinence: Secondary | ICD-10-CM | POA: Diagnosis not present

## 2021-02-01 DIAGNOSIS — M6281 Muscle weakness (generalized): Secondary | ICD-10-CM | POA: Diagnosis not present

## 2021-02-03 DIAGNOSIS — R262 Difficulty in walking, not elsewhere classified: Secondary | ICD-10-CM | POA: Diagnosis not present

## 2021-02-03 DIAGNOSIS — R260 Ataxic gait: Secondary | ICD-10-CM | POA: Diagnosis not present

## 2021-02-03 DIAGNOSIS — R41841 Cognitive communication deficit: Secondary | ICD-10-CM | POA: Diagnosis not present

## 2021-02-03 DIAGNOSIS — M79605 Pain in left leg: Secondary | ICD-10-CM | POA: Diagnosis not present

## 2021-02-03 DIAGNOSIS — M6281 Muscle weakness (generalized): Secondary | ICD-10-CM | POA: Diagnosis not present

## 2021-02-03 DIAGNOSIS — M79604 Pain in right leg: Secondary | ICD-10-CM | POA: Diagnosis not present

## 2021-02-03 DIAGNOSIS — N3946 Mixed incontinence: Secondary | ICD-10-CM | POA: Diagnosis not present

## 2021-02-04 DIAGNOSIS — R262 Difficulty in walking, not elsewhere classified: Secondary | ICD-10-CM | POA: Diagnosis not present

## 2021-02-04 DIAGNOSIS — M79604 Pain in right leg: Secondary | ICD-10-CM | POA: Diagnosis not present

## 2021-02-04 DIAGNOSIS — N3946 Mixed incontinence: Secondary | ICD-10-CM | POA: Diagnosis not present

## 2021-02-04 DIAGNOSIS — M6281 Muscle weakness (generalized): Secondary | ICD-10-CM | POA: Diagnosis not present

## 2021-02-04 DIAGNOSIS — R41841 Cognitive communication deficit: Secondary | ICD-10-CM | POA: Diagnosis not present

## 2021-02-04 DIAGNOSIS — M79605 Pain in left leg: Secondary | ICD-10-CM | POA: Diagnosis not present

## 2021-02-04 DIAGNOSIS — R260 Ataxic gait: Secondary | ICD-10-CM | POA: Diagnosis not present

## 2021-02-08 DIAGNOSIS — M6281 Muscle weakness (generalized): Secondary | ICD-10-CM | POA: Diagnosis not present

## 2021-02-08 DIAGNOSIS — R41841 Cognitive communication deficit: Secondary | ICD-10-CM | POA: Diagnosis not present

## 2021-02-08 DIAGNOSIS — R260 Ataxic gait: Secondary | ICD-10-CM | POA: Diagnosis not present

## 2021-02-08 DIAGNOSIS — M79604 Pain in right leg: Secondary | ICD-10-CM | POA: Diagnosis not present

## 2021-02-08 DIAGNOSIS — M79605 Pain in left leg: Secondary | ICD-10-CM | POA: Diagnosis not present

## 2021-02-08 DIAGNOSIS — N3946 Mixed incontinence: Secondary | ICD-10-CM | POA: Diagnosis not present

## 2021-02-08 DIAGNOSIS — R262 Difficulty in walking, not elsewhere classified: Secondary | ICD-10-CM | POA: Diagnosis not present

## 2021-02-09 DIAGNOSIS — M79605 Pain in left leg: Secondary | ICD-10-CM | POA: Diagnosis not present

## 2021-02-09 DIAGNOSIS — N3946 Mixed incontinence: Secondary | ICD-10-CM | POA: Diagnosis not present

## 2021-02-09 DIAGNOSIS — R41841 Cognitive communication deficit: Secondary | ICD-10-CM | POA: Diagnosis not present

## 2021-02-09 DIAGNOSIS — R262 Difficulty in walking, not elsewhere classified: Secondary | ICD-10-CM | POA: Diagnosis not present

## 2021-02-09 DIAGNOSIS — R260 Ataxic gait: Secondary | ICD-10-CM | POA: Diagnosis not present

## 2021-02-09 DIAGNOSIS — M6281 Muscle weakness (generalized): Secondary | ICD-10-CM | POA: Diagnosis not present

## 2021-02-09 DIAGNOSIS — M79604 Pain in right leg: Secondary | ICD-10-CM | POA: Diagnosis not present

## 2021-02-10 DIAGNOSIS — R262 Difficulty in walking, not elsewhere classified: Secondary | ICD-10-CM | POA: Diagnosis not present

## 2021-02-10 DIAGNOSIS — N3946 Mixed incontinence: Secondary | ICD-10-CM | POA: Diagnosis not present

## 2021-02-10 DIAGNOSIS — R41841 Cognitive communication deficit: Secondary | ICD-10-CM | POA: Diagnosis not present

## 2021-02-10 DIAGNOSIS — R260 Ataxic gait: Secondary | ICD-10-CM | POA: Diagnosis not present

## 2021-02-10 DIAGNOSIS — M79604 Pain in right leg: Secondary | ICD-10-CM | POA: Diagnosis not present

## 2021-02-10 DIAGNOSIS — M6281 Muscle weakness (generalized): Secondary | ICD-10-CM | POA: Diagnosis not present

## 2021-02-10 DIAGNOSIS — M79605 Pain in left leg: Secondary | ICD-10-CM | POA: Diagnosis not present

## 2021-02-14 DIAGNOSIS — R260 Ataxic gait: Secondary | ICD-10-CM | POA: Diagnosis not present

## 2021-02-14 DIAGNOSIS — M6281 Muscle weakness (generalized): Secondary | ICD-10-CM | POA: Diagnosis not present

## 2021-02-14 DIAGNOSIS — M79605 Pain in left leg: Secondary | ICD-10-CM | POA: Diagnosis not present

## 2021-02-14 DIAGNOSIS — M79604 Pain in right leg: Secondary | ICD-10-CM | POA: Diagnosis not present

## 2021-02-14 DIAGNOSIS — R41841 Cognitive communication deficit: Secondary | ICD-10-CM | POA: Diagnosis not present

## 2021-02-14 DIAGNOSIS — N3946 Mixed incontinence: Secondary | ICD-10-CM | POA: Diagnosis not present

## 2021-02-14 DIAGNOSIS — R262 Difficulty in walking, not elsewhere classified: Secondary | ICD-10-CM | POA: Diagnosis not present

## 2021-02-15 DIAGNOSIS — N3946 Mixed incontinence: Secondary | ICD-10-CM | POA: Diagnosis not present

## 2021-02-15 DIAGNOSIS — M79605 Pain in left leg: Secondary | ICD-10-CM | POA: Diagnosis not present

## 2021-02-15 DIAGNOSIS — M79604 Pain in right leg: Secondary | ICD-10-CM | POA: Diagnosis not present

## 2021-02-15 DIAGNOSIS — R262 Difficulty in walking, not elsewhere classified: Secondary | ICD-10-CM | POA: Diagnosis not present

## 2021-02-15 DIAGNOSIS — M6281 Muscle weakness (generalized): Secondary | ICD-10-CM | POA: Diagnosis not present

## 2021-02-15 DIAGNOSIS — R41841 Cognitive communication deficit: Secondary | ICD-10-CM | POA: Diagnosis not present

## 2021-02-15 DIAGNOSIS — R260 Ataxic gait: Secondary | ICD-10-CM | POA: Diagnosis not present

## 2021-02-16 DIAGNOSIS — M79605 Pain in left leg: Secondary | ICD-10-CM | POA: Diagnosis not present

## 2021-02-16 DIAGNOSIS — M6281 Muscle weakness (generalized): Secondary | ICD-10-CM | POA: Diagnosis not present

## 2021-02-16 DIAGNOSIS — M79604 Pain in right leg: Secondary | ICD-10-CM | POA: Diagnosis not present

## 2021-02-16 DIAGNOSIS — R260 Ataxic gait: Secondary | ICD-10-CM | POA: Diagnosis not present

## 2021-02-16 DIAGNOSIS — R41841 Cognitive communication deficit: Secondary | ICD-10-CM | POA: Diagnosis not present

## 2021-02-16 DIAGNOSIS — R262 Difficulty in walking, not elsewhere classified: Secondary | ICD-10-CM | POA: Diagnosis not present

## 2021-02-16 DIAGNOSIS — N3946 Mixed incontinence: Secondary | ICD-10-CM | POA: Diagnosis not present

## 2021-02-17 DIAGNOSIS — N3946 Mixed incontinence: Secondary | ICD-10-CM | POA: Diagnosis not present

## 2021-02-17 DIAGNOSIS — R262 Difficulty in walking, not elsewhere classified: Secondary | ICD-10-CM | POA: Diagnosis not present

## 2021-02-17 DIAGNOSIS — M79605 Pain in left leg: Secondary | ICD-10-CM | POA: Diagnosis not present

## 2021-02-17 DIAGNOSIS — R260 Ataxic gait: Secondary | ICD-10-CM | POA: Diagnosis not present

## 2021-02-17 DIAGNOSIS — M79604 Pain in right leg: Secondary | ICD-10-CM | POA: Diagnosis not present

## 2021-02-17 DIAGNOSIS — M6281 Muscle weakness (generalized): Secondary | ICD-10-CM | POA: Diagnosis not present

## 2021-02-17 DIAGNOSIS — R41841 Cognitive communication deficit: Secondary | ICD-10-CM | POA: Diagnosis not present

## 2021-02-22 DIAGNOSIS — R41841 Cognitive communication deficit: Secondary | ICD-10-CM | POA: Diagnosis not present

## 2021-02-22 DIAGNOSIS — M79605 Pain in left leg: Secondary | ICD-10-CM | POA: Diagnosis not present

## 2021-02-22 DIAGNOSIS — R262 Difficulty in walking, not elsewhere classified: Secondary | ICD-10-CM | POA: Diagnosis not present

## 2021-02-22 DIAGNOSIS — M79604 Pain in right leg: Secondary | ICD-10-CM | POA: Diagnosis not present

## 2021-02-22 DIAGNOSIS — M6281 Muscle weakness (generalized): Secondary | ICD-10-CM | POA: Diagnosis not present

## 2021-02-22 DIAGNOSIS — N3946 Mixed incontinence: Secondary | ICD-10-CM | POA: Diagnosis not present

## 2021-02-22 DIAGNOSIS — R260 Ataxic gait: Secondary | ICD-10-CM | POA: Diagnosis not present

## 2021-02-23 DIAGNOSIS — N3946 Mixed incontinence: Secondary | ICD-10-CM | POA: Diagnosis not present

## 2021-02-23 DIAGNOSIS — M6281 Muscle weakness (generalized): Secondary | ICD-10-CM | POA: Diagnosis not present

## 2021-02-23 DIAGNOSIS — R41841 Cognitive communication deficit: Secondary | ICD-10-CM | POA: Diagnosis not present

## 2021-02-23 DIAGNOSIS — R262 Difficulty in walking, not elsewhere classified: Secondary | ICD-10-CM | POA: Diagnosis not present

## 2021-02-23 DIAGNOSIS — M79604 Pain in right leg: Secondary | ICD-10-CM | POA: Diagnosis not present

## 2021-02-23 DIAGNOSIS — R260 Ataxic gait: Secondary | ICD-10-CM | POA: Diagnosis not present

## 2021-02-23 DIAGNOSIS — M79605 Pain in left leg: Secondary | ICD-10-CM | POA: Diagnosis not present

## 2021-02-24 DIAGNOSIS — R260 Ataxic gait: Secondary | ICD-10-CM | POA: Diagnosis not present

## 2021-02-24 DIAGNOSIS — M79604 Pain in right leg: Secondary | ICD-10-CM | POA: Diagnosis not present

## 2021-02-24 DIAGNOSIS — R41841 Cognitive communication deficit: Secondary | ICD-10-CM | POA: Diagnosis not present

## 2021-02-24 DIAGNOSIS — N3946 Mixed incontinence: Secondary | ICD-10-CM | POA: Diagnosis not present

## 2021-02-24 DIAGNOSIS — M6281 Muscle weakness (generalized): Secondary | ICD-10-CM | POA: Diagnosis not present

## 2021-02-24 DIAGNOSIS — M79605 Pain in left leg: Secondary | ICD-10-CM | POA: Diagnosis not present

## 2021-02-24 DIAGNOSIS — R262 Difficulty in walking, not elsewhere classified: Secondary | ICD-10-CM | POA: Diagnosis not present

## 2021-02-25 DIAGNOSIS — M79604 Pain in right leg: Secondary | ICD-10-CM | POA: Diagnosis not present

## 2021-02-25 DIAGNOSIS — R41841 Cognitive communication deficit: Secondary | ICD-10-CM | POA: Diagnosis not present

## 2021-02-25 DIAGNOSIS — R262 Difficulty in walking, not elsewhere classified: Secondary | ICD-10-CM | POA: Diagnosis not present

## 2021-02-25 DIAGNOSIS — R260 Ataxic gait: Secondary | ICD-10-CM | POA: Diagnosis not present

## 2021-02-25 DIAGNOSIS — N3946 Mixed incontinence: Secondary | ICD-10-CM | POA: Diagnosis not present

## 2021-02-25 DIAGNOSIS — M6281 Muscle weakness (generalized): Secondary | ICD-10-CM | POA: Diagnosis not present

## 2021-02-25 DIAGNOSIS — M79605 Pain in left leg: Secondary | ICD-10-CM | POA: Diagnosis not present

## 2021-02-26 DIAGNOSIS — N3946 Mixed incontinence: Secondary | ICD-10-CM | POA: Diagnosis not present

## 2021-02-26 DIAGNOSIS — R262 Difficulty in walking, not elsewhere classified: Secondary | ICD-10-CM | POA: Diagnosis not present

## 2021-02-26 DIAGNOSIS — M6281 Muscle weakness (generalized): Secondary | ICD-10-CM | POA: Diagnosis not present

## 2021-02-26 DIAGNOSIS — R260 Ataxic gait: Secondary | ICD-10-CM | POA: Diagnosis not present

## 2021-02-26 DIAGNOSIS — M79604 Pain in right leg: Secondary | ICD-10-CM | POA: Diagnosis not present

## 2021-02-26 DIAGNOSIS — M79605 Pain in left leg: Secondary | ICD-10-CM | POA: Diagnosis not present

## 2021-02-26 DIAGNOSIS — R41841 Cognitive communication deficit: Secondary | ICD-10-CM | POA: Diagnosis not present

## 2021-02-26 DIAGNOSIS — G4733 Obstructive sleep apnea (adult) (pediatric): Secondary | ICD-10-CM | POA: Diagnosis not present

## 2021-03-01 DIAGNOSIS — N3946 Mixed incontinence: Secondary | ICD-10-CM | POA: Diagnosis not present

## 2021-03-01 DIAGNOSIS — R41841 Cognitive communication deficit: Secondary | ICD-10-CM | POA: Diagnosis not present

## 2021-03-01 DIAGNOSIS — R260 Ataxic gait: Secondary | ICD-10-CM | POA: Diagnosis not present

## 2021-03-01 DIAGNOSIS — M6281 Muscle weakness (generalized): Secondary | ICD-10-CM | POA: Diagnosis not present

## 2021-03-01 DIAGNOSIS — M79605 Pain in left leg: Secondary | ICD-10-CM | POA: Diagnosis not present

## 2021-03-01 DIAGNOSIS — M79604 Pain in right leg: Secondary | ICD-10-CM | POA: Diagnosis not present

## 2021-03-01 DIAGNOSIS — R262 Difficulty in walking, not elsewhere classified: Secondary | ICD-10-CM | POA: Diagnosis not present

## 2021-03-03 DIAGNOSIS — M79604 Pain in right leg: Secondary | ICD-10-CM | POA: Diagnosis not present

## 2021-03-03 DIAGNOSIS — M6281 Muscle weakness (generalized): Secondary | ICD-10-CM | POA: Diagnosis not present

## 2021-03-03 DIAGNOSIS — M79605 Pain in left leg: Secondary | ICD-10-CM | POA: Diagnosis not present

## 2021-03-03 DIAGNOSIS — R260 Ataxic gait: Secondary | ICD-10-CM | POA: Diagnosis not present

## 2021-03-03 DIAGNOSIS — R41841 Cognitive communication deficit: Secondary | ICD-10-CM | POA: Diagnosis not present

## 2021-03-03 DIAGNOSIS — N3946 Mixed incontinence: Secondary | ICD-10-CM | POA: Diagnosis not present

## 2021-03-03 DIAGNOSIS — R262 Difficulty in walking, not elsewhere classified: Secondary | ICD-10-CM | POA: Diagnosis not present

## 2021-03-04 DIAGNOSIS — R260 Ataxic gait: Secondary | ICD-10-CM | POA: Diagnosis not present

## 2021-03-04 DIAGNOSIS — M79605 Pain in left leg: Secondary | ICD-10-CM | POA: Diagnosis not present

## 2021-03-04 DIAGNOSIS — M6281 Muscle weakness (generalized): Secondary | ICD-10-CM | POA: Diagnosis not present

## 2021-03-04 DIAGNOSIS — R262 Difficulty in walking, not elsewhere classified: Secondary | ICD-10-CM | POA: Diagnosis not present

## 2021-03-04 DIAGNOSIS — R41841 Cognitive communication deficit: Secondary | ICD-10-CM | POA: Diagnosis not present

## 2021-03-04 DIAGNOSIS — N3946 Mixed incontinence: Secondary | ICD-10-CM | POA: Diagnosis not present

## 2021-03-04 DIAGNOSIS — M79604 Pain in right leg: Secondary | ICD-10-CM | POA: Diagnosis not present

## 2021-03-05 DIAGNOSIS — N3946 Mixed incontinence: Secondary | ICD-10-CM | POA: Diagnosis not present

## 2021-03-05 DIAGNOSIS — M79604 Pain in right leg: Secondary | ICD-10-CM | POA: Diagnosis not present

## 2021-03-05 DIAGNOSIS — M79605 Pain in left leg: Secondary | ICD-10-CM | POA: Diagnosis not present

## 2021-03-05 DIAGNOSIS — M6281 Muscle weakness (generalized): Secondary | ICD-10-CM | POA: Diagnosis not present

## 2021-03-05 DIAGNOSIS — R260 Ataxic gait: Secondary | ICD-10-CM | POA: Diagnosis not present

## 2021-03-05 DIAGNOSIS — R41841 Cognitive communication deficit: Secondary | ICD-10-CM | POA: Diagnosis not present

## 2021-03-05 DIAGNOSIS — R262 Difficulty in walking, not elsewhere classified: Secondary | ICD-10-CM | POA: Diagnosis not present

## 2021-03-08 DIAGNOSIS — R260 Ataxic gait: Secondary | ICD-10-CM | POA: Diagnosis not present

## 2021-03-08 DIAGNOSIS — N3946 Mixed incontinence: Secondary | ICD-10-CM | POA: Diagnosis not present

## 2021-03-08 DIAGNOSIS — M79605 Pain in left leg: Secondary | ICD-10-CM | POA: Diagnosis not present

## 2021-03-08 DIAGNOSIS — M6281 Muscle weakness (generalized): Secondary | ICD-10-CM | POA: Diagnosis not present

## 2021-03-08 DIAGNOSIS — R262 Difficulty in walking, not elsewhere classified: Secondary | ICD-10-CM | POA: Diagnosis not present

## 2021-03-08 DIAGNOSIS — R41841 Cognitive communication deficit: Secondary | ICD-10-CM | POA: Diagnosis not present

## 2021-03-08 DIAGNOSIS — M79604 Pain in right leg: Secondary | ICD-10-CM | POA: Diagnosis not present

## 2021-03-10 DIAGNOSIS — R262 Difficulty in walking, not elsewhere classified: Secondary | ICD-10-CM | POA: Diagnosis not present

## 2021-03-10 DIAGNOSIS — M79605 Pain in left leg: Secondary | ICD-10-CM | POA: Diagnosis not present

## 2021-03-10 DIAGNOSIS — M79604 Pain in right leg: Secondary | ICD-10-CM | POA: Diagnosis not present

## 2021-03-10 DIAGNOSIS — R260 Ataxic gait: Secondary | ICD-10-CM | POA: Diagnosis not present

## 2021-03-10 DIAGNOSIS — N3946 Mixed incontinence: Secondary | ICD-10-CM | POA: Diagnosis not present

## 2021-03-10 DIAGNOSIS — M6281 Muscle weakness (generalized): Secondary | ICD-10-CM | POA: Diagnosis not present

## 2021-03-10 DIAGNOSIS — R41841 Cognitive communication deficit: Secondary | ICD-10-CM | POA: Diagnosis not present

## 2021-03-11 DIAGNOSIS — N3946 Mixed incontinence: Secondary | ICD-10-CM | POA: Diagnosis not present

## 2021-03-11 DIAGNOSIS — R41841 Cognitive communication deficit: Secondary | ICD-10-CM | POA: Diagnosis not present

## 2021-03-11 DIAGNOSIS — M79605 Pain in left leg: Secondary | ICD-10-CM | POA: Diagnosis not present

## 2021-03-11 DIAGNOSIS — R262 Difficulty in walking, not elsewhere classified: Secondary | ICD-10-CM | POA: Diagnosis not present

## 2021-03-11 DIAGNOSIS — M6281 Muscle weakness (generalized): Secondary | ICD-10-CM | POA: Diagnosis not present

## 2021-03-11 DIAGNOSIS — R260 Ataxic gait: Secondary | ICD-10-CM | POA: Diagnosis not present

## 2021-03-11 DIAGNOSIS — M79604 Pain in right leg: Secondary | ICD-10-CM | POA: Diagnosis not present

## 2021-03-12 DIAGNOSIS — M79604 Pain in right leg: Secondary | ICD-10-CM | POA: Diagnosis not present

## 2021-03-12 DIAGNOSIS — R262 Difficulty in walking, not elsewhere classified: Secondary | ICD-10-CM | POA: Diagnosis not present

## 2021-03-12 DIAGNOSIS — M6281 Muscle weakness (generalized): Secondary | ICD-10-CM | POA: Diagnosis not present

## 2021-03-12 DIAGNOSIS — N3946 Mixed incontinence: Secondary | ICD-10-CM | POA: Diagnosis not present

## 2021-03-12 DIAGNOSIS — M79605 Pain in left leg: Secondary | ICD-10-CM | POA: Diagnosis not present

## 2021-03-12 DIAGNOSIS — R260 Ataxic gait: Secondary | ICD-10-CM | POA: Diagnosis not present

## 2021-03-12 DIAGNOSIS — R41841 Cognitive communication deficit: Secondary | ICD-10-CM | POA: Diagnosis not present

## 2021-03-17 DIAGNOSIS — N3946 Mixed incontinence: Secondary | ICD-10-CM | POA: Diagnosis not present

## 2021-03-17 DIAGNOSIS — R41841 Cognitive communication deficit: Secondary | ICD-10-CM | POA: Diagnosis not present

## 2021-03-17 DIAGNOSIS — M6281 Muscle weakness (generalized): Secondary | ICD-10-CM | POA: Diagnosis not present

## 2021-03-17 DIAGNOSIS — R260 Ataxic gait: Secondary | ICD-10-CM | POA: Diagnosis not present

## 2021-03-17 DIAGNOSIS — R262 Difficulty in walking, not elsewhere classified: Secondary | ICD-10-CM | POA: Diagnosis not present

## 2021-03-17 DIAGNOSIS — M79604 Pain in right leg: Secondary | ICD-10-CM | POA: Diagnosis not present

## 2021-03-17 DIAGNOSIS — M79605 Pain in left leg: Secondary | ICD-10-CM | POA: Diagnosis not present

## 2021-03-18 DIAGNOSIS — M79604 Pain in right leg: Secondary | ICD-10-CM | POA: Diagnosis not present

## 2021-03-18 DIAGNOSIS — M79605 Pain in left leg: Secondary | ICD-10-CM | POA: Diagnosis not present

## 2021-03-18 DIAGNOSIS — N3946 Mixed incontinence: Secondary | ICD-10-CM | POA: Diagnosis not present

## 2021-03-18 DIAGNOSIS — M6281 Muscle weakness (generalized): Secondary | ICD-10-CM | POA: Diagnosis not present

## 2021-03-18 DIAGNOSIS — R41841 Cognitive communication deficit: Secondary | ICD-10-CM | POA: Diagnosis not present

## 2021-03-18 DIAGNOSIS — R262 Difficulty in walking, not elsewhere classified: Secondary | ICD-10-CM | POA: Diagnosis not present

## 2021-03-18 DIAGNOSIS — R260 Ataxic gait: Secondary | ICD-10-CM | POA: Diagnosis not present

## 2021-03-19 DIAGNOSIS — R262 Difficulty in walking, not elsewhere classified: Secondary | ICD-10-CM | POA: Diagnosis not present

## 2021-03-19 DIAGNOSIS — E119 Type 2 diabetes mellitus without complications: Secondary | ICD-10-CM | POA: Diagnosis not present

## 2021-03-19 DIAGNOSIS — I10 Essential (primary) hypertension: Secondary | ICD-10-CM | POA: Diagnosis not present

## 2021-03-19 DIAGNOSIS — M6281 Muscle weakness (generalized): Secondary | ICD-10-CM | POA: Diagnosis not present

## 2021-03-19 DIAGNOSIS — M79605 Pain in left leg: Secondary | ICD-10-CM | POA: Diagnosis not present

## 2021-03-19 DIAGNOSIS — N3946 Mixed incontinence: Secondary | ICD-10-CM | POA: Diagnosis not present

## 2021-03-19 DIAGNOSIS — R41841 Cognitive communication deficit: Secondary | ICD-10-CM | POA: Diagnosis not present

## 2021-03-19 DIAGNOSIS — E1142 Type 2 diabetes mellitus with diabetic polyneuropathy: Secondary | ICD-10-CM | POA: Diagnosis not present

## 2021-03-19 DIAGNOSIS — M79604 Pain in right leg: Secondary | ICD-10-CM | POA: Diagnosis not present

## 2021-03-19 DIAGNOSIS — I7300 Raynaud's syndrome without gangrene: Secondary | ICD-10-CM | POA: Diagnosis not present

## 2021-03-19 DIAGNOSIS — R260 Ataxic gait: Secondary | ICD-10-CM | POA: Diagnosis not present

## 2021-03-23 ENCOUNTER — Encounter: Payer: Self-pay | Admitting: Neurology

## 2021-03-23 DIAGNOSIS — R7303 Prediabetes: Secondary | ICD-10-CM | POA: Diagnosis not present

## 2021-03-23 DIAGNOSIS — I10 Essential (primary) hypertension: Secondary | ICD-10-CM | POA: Diagnosis not present

## 2021-03-24 DIAGNOSIS — M79605 Pain in left leg: Secondary | ICD-10-CM | POA: Diagnosis not present

## 2021-03-24 DIAGNOSIS — R260 Ataxic gait: Secondary | ICD-10-CM | POA: Diagnosis not present

## 2021-03-24 DIAGNOSIS — M79604 Pain in right leg: Secondary | ICD-10-CM | POA: Diagnosis not present

## 2021-03-24 DIAGNOSIS — M6281 Muscle weakness (generalized): Secondary | ICD-10-CM | POA: Diagnosis not present

## 2021-03-24 DIAGNOSIS — R262 Difficulty in walking, not elsewhere classified: Secondary | ICD-10-CM | POA: Diagnosis not present

## 2021-03-24 DIAGNOSIS — N3946 Mixed incontinence: Secondary | ICD-10-CM | POA: Diagnosis not present

## 2021-03-25 DIAGNOSIS — M79604 Pain in right leg: Secondary | ICD-10-CM | POA: Diagnosis not present

## 2021-03-25 DIAGNOSIS — R262 Difficulty in walking, not elsewhere classified: Secondary | ICD-10-CM | POA: Diagnosis not present

## 2021-03-25 DIAGNOSIS — M6281 Muscle weakness (generalized): Secondary | ICD-10-CM | POA: Diagnosis not present

## 2021-03-25 DIAGNOSIS — M79605 Pain in left leg: Secondary | ICD-10-CM | POA: Diagnosis not present

## 2021-03-25 DIAGNOSIS — N3946 Mixed incontinence: Secondary | ICD-10-CM | POA: Diagnosis not present

## 2021-03-25 DIAGNOSIS — R260 Ataxic gait: Secondary | ICD-10-CM | POA: Diagnosis not present

## 2021-03-26 DIAGNOSIS — R260 Ataxic gait: Secondary | ICD-10-CM | POA: Diagnosis not present

## 2021-03-26 DIAGNOSIS — M79604 Pain in right leg: Secondary | ICD-10-CM | POA: Diagnosis not present

## 2021-03-26 DIAGNOSIS — M6281 Muscle weakness (generalized): Secondary | ICD-10-CM | POA: Diagnosis not present

## 2021-03-26 DIAGNOSIS — M79605 Pain in left leg: Secondary | ICD-10-CM | POA: Diagnosis not present

## 2021-03-26 DIAGNOSIS — R262 Difficulty in walking, not elsewhere classified: Secondary | ICD-10-CM | POA: Diagnosis not present

## 2021-03-26 DIAGNOSIS — N3946 Mixed incontinence: Secondary | ICD-10-CM | POA: Diagnosis not present

## 2021-03-28 DIAGNOSIS — R262 Difficulty in walking, not elsewhere classified: Secondary | ICD-10-CM | POA: Diagnosis not present

## 2021-03-28 DIAGNOSIS — M6281 Muscle weakness (generalized): Secondary | ICD-10-CM | POA: Diagnosis not present

## 2021-03-28 DIAGNOSIS — M79604 Pain in right leg: Secondary | ICD-10-CM | POA: Diagnosis not present

## 2021-03-28 DIAGNOSIS — R260 Ataxic gait: Secondary | ICD-10-CM | POA: Diagnosis not present

## 2021-03-28 DIAGNOSIS — M79605 Pain in left leg: Secondary | ICD-10-CM | POA: Diagnosis not present

## 2021-03-28 DIAGNOSIS — N3946 Mixed incontinence: Secondary | ICD-10-CM | POA: Diagnosis not present

## 2021-03-29 DIAGNOSIS — G4733 Obstructive sleep apnea (adult) (pediatric): Secondary | ICD-10-CM | POA: Diagnosis not present

## 2021-03-30 ENCOUNTER — Ambulatory Visit: Payer: PPO | Admitting: Psychiatry

## 2021-03-30 ENCOUNTER — Ambulatory Visit: Payer: PPO | Admitting: Podiatry

## 2021-03-30 DIAGNOSIS — R260 Ataxic gait: Secondary | ICD-10-CM | POA: Diagnosis not present

## 2021-03-30 DIAGNOSIS — M79604 Pain in right leg: Secondary | ICD-10-CM | POA: Diagnosis not present

## 2021-03-30 DIAGNOSIS — M6281 Muscle weakness (generalized): Secondary | ICD-10-CM | POA: Diagnosis not present

## 2021-03-30 DIAGNOSIS — M79605 Pain in left leg: Secondary | ICD-10-CM | POA: Diagnosis not present

## 2021-03-30 DIAGNOSIS — N3946 Mixed incontinence: Secondary | ICD-10-CM | POA: Diagnosis not present

## 2021-03-30 DIAGNOSIS — R262 Difficulty in walking, not elsewhere classified: Secondary | ICD-10-CM | POA: Diagnosis not present

## 2021-03-31 DIAGNOSIS — M79605 Pain in left leg: Secondary | ICD-10-CM | POA: Diagnosis not present

## 2021-03-31 DIAGNOSIS — M79604 Pain in right leg: Secondary | ICD-10-CM | POA: Diagnosis not present

## 2021-03-31 DIAGNOSIS — M6281 Muscle weakness (generalized): Secondary | ICD-10-CM | POA: Diagnosis not present

## 2021-03-31 DIAGNOSIS — N3946 Mixed incontinence: Secondary | ICD-10-CM | POA: Diagnosis not present

## 2021-03-31 DIAGNOSIS — R260 Ataxic gait: Secondary | ICD-10-CM | POA: Diagnosis not present

## 2021-03-31 DIAGNOSIS — R262 Difficulty in walking, not elsewhere classified: Secondary | ICD-10-CM | POA: Diagnosis not present

## 2021-04-01 DIAGNOSIS — M79605 Pain in left leg: Secondary | ICD-10-CM | POA: Diagnosis not present

## 2021-04-01 DIAGNOSIS — R262 Difficulty in walking, not elsewhere classified: Secondary | ICD-10-CM | POA: Diagnosis not present

## 2021-04-01 DIAGNOSIS — M79604 Pain in right leg: Secondary | ICD-10-CM | POA: Diagnosis not present

## 2021-04-01 DIAGNOSIS — M6281 Muscle weakness (generalized): Secondary | ICD-10-CM | POA: Diagnosis not present

## 2021-04-01 DIAGNOSIS — N3946 Mixed incontinence: Secondary | ICD-10-CM | POA: Diagnosis not present

## 2021-04-01 DIAGNOSIS — R260 Ataxic gait: Secondary | ICD-10-CM | POA: Diagnosis not present

## 2021-04-02 DIAGNOSIS — R262 Difficulty in walking, not elsewhere classified: Secondary | ICD-10-CM | POA: Diagnosis not present

## 2021-04-02 DIAGNOSIS — M6281 Muscle weakness (generalized): Secondary | ICD-10-CM | POA: Diagnosis not present

## 2021-04-02 DIAGNOSIS — R260 Ataxic gait: Secondary | ICD-10-CM | POA: Diagnosis not present

## 2021-04-02 DIAGNOSIS — N3946 Mixed incontinence: Secondary | ICD-10-CM | POA: Diagnosis not present

## 2021-04-02 DIAGNOSIS — M79605 Pain in left leg: Secondary | ICD-10-CM | POA: Diagnosis not present

## 2021-04-02 DIAGNOSIS — M79604 Pain in right leg: Secondary | ICD-10-CM | POA: Diagnosis not present

## 2021-04-06 DIAGNOSIS — M6281 Muscle weakness (generalized): Secondary | ICD-10-CM | POA: Diagnosis not present

## 2021-04-06 DIAGNOSIS — M79604 Pain in right leg: Secondary | ICD-10-CM | POA: Diagnosis not present

## 2021-04-06 DIAGNOSIS — R260 Ataxic gait: Secondary | ICD-10-CM | POA: Diagnosis not present

## 2021-04-06 DIAGNOSIS — N3946 Mixed incontinence: Secondary | ICD-10-CM | POA: Diagnosis not present

## 2021-04-06 DIAGNOSIS — M79605 Pain in left leg: Secondary | ICD-10-CM | POA: Diagnosis not present

## 2021-04-06 DIAGNOSIS — R262 Difficulty in walking, not elsewhere classified: Secondary | ICD-10-CM | POA: Diagnosis not present

## 2021-04-07 DIAGNOSIS — R262 Difficulty in walking, not elsewhere classified: Secondary | ICD-10-CM | POA: Diagnosis not present

## 2021-04-07 DIAGNOSIS — R260 Ataxic gait: Secondary | ICD-10-CM | POA: Diagnosis not present

## 2021-04-07 DIAGNOSIS — M79605 Pain in left leg: Secondary | ICD-10-CM | POA: Diagnosis not present

## 2021-04-07 DIAGNOSIS — N3946 Mixed incontinence: Secondary | ICD-10-CM | POA: Diagnosis not present

## 2021-04-07 DIAGNOSIS — M79604 Pain in right leg: Secondary | ICD-10-CM | POA: Diagnosis not present

## 2021-04-07 DIAGNOSIS — M6281 Muscle weakness (generalized): Secondary | ICD-10-CM | POA: Diagnosis not present

## 2021-04-08 DIAGNOSIS — N3946 Mixed incontinence: Secondary | ICD-10-CM | POA: Diagnosis not present

## 2021-04-08 DIAGNOSIS — M6281 Muscle weakness (generalized): Secondary | ICD-10-CM | POA: Diagnosis not present

## 2021-04-08 DIAGNOSIS — M79605 Pain in left leg: Secondary | ICD-10-CM | POA: Diagnosis not present

## 2021-04-08 DIAGNOSIS — R262 Difficulty in walking, not elsewhere classified: Secondary | ICD-10-CM | POA: Diagnosis not present

## 2021-04-08 DIAGNOSIS — R260 Ataxic gait: Secondary | ICD-10-CM | POA: Diagnosis not present

## 2021-04-08 DIAGNOSIS — M79604 Pain in right leg: Secondary | ICD-10-CM | POA: Diagnosis not present

## 2021-04-09 DIAGNOSIS — R260 Ataxic gait: Secondary | ICD-10-CM | POA: Diagnosis not present

## 2021-04-09 DIAGNOSIS — N3946 Mixed incontinence: Secondary | ICD-10-CM | POA: Diagnosis not present

## 2021-04-09 DIAGNOSIS — M79605 Pain in left leg: Secondary | ICD-10-CM | POA: Diagnosis not present

## 2021-04-09 DIAGNOSIS — M79604 Pain in right leg: Secondary | ICD-10-CM | POA: Diagnosis not present

## 2021-04-09 DIAGNOSIS — M6281 Muscle weakness (generalized): Secondary | ICD-10-CM | POA: Diagnosis not present

## 2021-04-09 DIAGNOSIS — R262 Difficulty in walking, not elsewhere classified: Secondary | ICD-10-CM | POA: Diagnosis not present

## 2021-04-10 DIAGNOSIS — R260 Ataxic gait: Secondary | ICD-10-CM | POA: Diagnosis not present

## 2021-04-10 DIAGNOSIS — M6281 Muscle weakness (generalized): Secondary | ICD-10-CM | POA: Diagnosis not present

## 2021-04-10 DIAGNOSIS — M79604 Pain in right leg: Secondary | ICD-10-CM | POA: Diagnosis not present

## 2021-04-10 DIAGNOSIS — N3946 Mixed incontinence: Secondary | ICD-10-CM | POA: Diagnosis not present

## 2021-04-10 DIAGNOSIS — R262 Difficulty in walking, not elsewhere classified: Secondary | ICD-10-CM | POA: Diagnosis not present

## 2021-04-10 DIAGNOSIS — M79605 Pain in left leg: Secondary | ICD-10-CM | POA: Diagnosis not present

## 2021-04-11 DIAGNOSIS — M79605 Pain in left leg: Secondary | ICD-10-CM | POA: Diagnosis not present

## 2021-04-11 DIAGNOSIS — M79604 Pain in right leg: Secondary | ICD-10-CM | POA: Diagnosis not present

## 2021-04-11 DIAGNOSIS — R262 Difficulty in walking, not elsewhere classified: Secondary | ICD-10-CM | POA: Diagnosis not present

## 2021-04-11 DIAGNOSIS — M6281 Muscle weakness (generalized): Secondary | ICD-10-CM | POA: Diagnosis not present

## 2021-04-11 DIAGNOSIS — N3946 Mixed incontinence: Secondary | ICD-10-CM | POA: Diagnosis not present

## 2021-04-11 DIAGNOSIS — R260 Ataxic gait: Secondary | ICD-10-CM | POA: Diagnosis not present

## 2021-04-12 DIAGNOSIS — N3946 Mixed incontinence: Secondary | ICD-10-CM | POA: Diagnosis not present

## 2021-04-12 DIAGNOSIS — M79604 Pain in right leg: Secondary | ICD-10-CM | POA: Diagnosis not present

## 2021-04-12 DIAGNOSIS — M79605 Pain in left leg: Secondary | ICD-10-CM | POA: Diagnosis not present

## 2021-04-12 DIAGNOSIS — R260 Ataxic gait: Secondary | ICD-10-CM | POA: Diagnosis not present

## 2021-04-12 DIAGNOSIS — R262 Difficulty in walking, not elsewhere classified: Secondary | ICD-10-CM | POA: Diagnosis not present

## 2021-04-12 DIAGNOSIS — M6281 Muscle weakness (generalized): Secondary | ICD-10-CM | POA: Diagnosis not present

## 2021-04-13 DIAGNOSIS — R262 Difficulty in walking, not elsewhere classified: Secondary | ICD-10-CM | POA: Diagnosis not present

## 2021-04-13 DIAGNOSIS — M79605 Pain in left leg: Secondary | ICD-10-CM | POA: Diagnosis not present

## 2021-04-13 DIAGNOSIS — R260 Ataxic gait: Secondary | ICD-10-CM | POA: Diagnosis not present

## 2021-04-13 DIAGNOSIS — M79604 Pain in right leg: Secondary | ICD-10-CM | POA: Diagnosis not present

## 2021-04-13 DIAGNOSIS — N3946 Mixed incontinence: Secondary | ICD-10-CM | POA: Diagnosis not present

## 2021-04-13 DIAGNOSIS — M6281 Muscle weakness (generalized): Secondary | ICD-10-CM | POA: Diagnosis not present

## 2021-04-15 DIAGNOSIS — R262 Difficulty in walking, not elsewhere classified: Secondary | ICD-10-CM | POA: Diagnosis not present

## 2021-04-15 DIAGNOSIS — M79604 Pain in right leg: Secondary | ICD-10-CM | POA: Diagnosis not present

## 2021-04-15 DIAGNOSIS — N3946 Mixed incontinence: Secondary | ICD-10-CM | POA: Diagnosis not present

## 2021-04-15 DIAGNOSIS — R260 Ataxic gait: Secondary | ICD-10-CM | POA: Diagnosis not present

## 2021-04-15 DIAGNOSIS — M79605 Pain in left leg: Secondary | ICD-10-CM | POA: Diagnosis not present

## 2021-04-15 DIAGNOSIS — M6281 Muscle weakness (generalized): Secondary | ICD-10-CM | POA: Diagnosis not present

## 2021-04-19 DIAGNOSIS — M79604 Pain in right leg: Secondary | ICD-10-CM | POA: Diagnosis not present

## 2021-04-19 DIAGNOSIS — R262 Difficulty in walking, not elsewhere classified: Secondary | ICD-10-CM | POA: Diagnosis not present

## 2021-04-19 DIAGNOSIS — M6281 Muscle weakness (generalized): Secondary | ICD-10-CM | POA: Diagnosis not present

## 2021-04-19 DIAGNOSIS — R260 Ataxic gait: Secondary | ICD-10-CM | POA: Diagnosis not present

## 2021-04-19 DIAGNOSIS — N3946 Mixed incontinence: Secondary | ICD-10-CM | POA: Diagnosis not present

## 2021-04-19 DIAGNOSIS — M79605 Pain in left leg: Secondary | ICD-10-CM | POA: Diagnosis not present

## 2021-04-20 DIAGNOSIS — M79605 Pain in left leg: Secondary | ICD-10-CM | POA: Diagnosis not present

## 2021-04-20 DIAGNOSIS — M6281 Muscle weakness (generalized): Secondary | ICD-10-CM | POA: Diagnosis not present

## 2021-04-20 DIAGNOSIS — N3946 Mixed incontinence: Secondary | ICD-10-CM | POA: Diagnosis not present

## 2021-04-20 DIAGNOSIS — R260 Ataxic gait: Secondary | ICD-10-CM | POA: Diagnosis not present

## 2021-04-20 DIAGNOSIS — M79604 Pain in right leg: Secondary | ICD-10-CM | POA: Diagnosis not present

## 2021-04-20 DIAGNOSIS — R262 Difficulty in walking, not elsewhere classified: Secondary | ICD-10-CM | POA: Diagnosis not present

## 2021-04-21 ENCOUNTER — Other Ambulatory Visit: Payer: Self-pay

## 2021-04-21 ENCOUNTER — Encounter: Payer: Self-pay | Admitting: Podiatry

## 2021-04-21 ENCOUNTER — Ambulatory Visit: Payer: PPO | Admitting: Podiatry

## 2021-04-21 DIAGNOSIS — M19079 Primary osteoarthritis, unspecified ankle and foot: Secondary | ICD-10-CM

## 2021-04-21 DIAGNOSIS — M778 Other enthesopathies, not elsewhere classified: Secondary | ICD-10-CM | POA: Diagnosis not present

## 2021-04-21 MED ORDER — MELOXICAM 15 MG PO TABS: 15.0000 mg | ORAL_TABLET | Freq: Every day | ORAL | 1 refills | Status: DC

## 2021-04-21 NOTE — Progress Notes (Signed)
°  Subjective:  Patient ID: Candice Velasquez, female    DOB: Jan 11, 1951,   MRN: [id number]  Chief Complaint  Patient presents with   Foot Pain    Follow up tendonitis left   "I am still having pain on top of my foot and ankle. The injection Dr. Logan Bores gave me helped"    71 y.o. female presents for concern of new left foot pain that has been going on for several weeks. Relates sever pain in the top middle part of the foot. . Relates he right foot is doing well and the injection helped previously. . Denies any other pedal complaints. Denies n/v/f/c.   Past Medical History:  Diagnosis Date   Depression    Diabetes mellitus without complication (HCC)    Falling    Hypertension     Objective:  Physical Exam: Vascular: DP/PT pulses 2/4 bilateral. CFT <3 seconds. Normal hair growth on digits. No edema.  Skin. No lacerations or abrasions bilateral feet.  Musculoskeletal: MMT 5/5 bilateral lower extremities in DF, PF, Inversion and Eversion. Deceased ROM in DF of ankle joint. Tender over the first TMTJ on the left.  Neurological: Sensation intact to light touch.   Assessment:   1. Capsulitis of left foot   2. Arthritis of midfoot      Plan:  Patient was evaluated and treated and all questions answered. Discussed midfoot arthritis with patient and treatment options.  No x-rays today refused.  Discussed NSAIDS, topicals, and possible injections.  Prescription for meloxicam provided. Injection provided procedure below Discussed stiff soled shoes and carbon fiber foot plate.  Discussed if pain does not improve can discuss surgical options.  Patient to follow-up in 6 weeks.    Louann Sjogren, DPM

## 2021-04-22 ENCOUNTER — Emergency Department (HOSPITAL_BASED_OUTPATIENT_CLINIC_OR_DEPARTMENT_OTHER)
Admission: EM | Admit: 2021-04-22 | Discharge: 2021-04-22 | Disposition: A | Discharge status: Home / Self Care | Payer: PPO | Attending: Emergency Medicine | Admitting: Emergency Medicine

## 2021-04-22 ENCOUNTER — Telehealth: Payer: Self-pay | Admitting: Podiatry

## 2021-04-22 ENCOUNTER — Encounter (HOSPITAL_BASED_OUTPATIENT_CLINIC_OR_DEPARTMENT_OTHER): Payer: Self-pay

## 2021-04-22 ENCOUNTER — Other Ambulatory Visit: Payer: Self-pay

## 2021-04-22 ENCOUNTER — Institutional Professional Consult (permissible substitution): Payer: PPO | Admitting: Neurology

## 2021-04-22 ENCOUNTER — Emergency Department (HOSPITAL_BASED_OUTPATIENT_CLINIC_OR_DEPARTMENT_OTHER): Payer: PPO

## 2021-04-22 DIAGNOSIS — M2550 Pain in unspecified joint: Secondary | ICD-10-CM

## 2021-04-22 DIAGNOSIS — M25561 Pain in right knee: Secondary | ICD-10-CM | POA: Insufficient documentation

## 2021-04-22 DIAGNOSIS — M1712 Unilateral primary osteoarthritis, left knee: Secondary | ICD-10-CM | POA: Diagnosis not present

## 2021-04-22 DIAGNOSIS — R609 Edema, unspecified: Secondary | ICD-10-CM | POA: Diagnosis not present

## 2021-04-22 DIAGNOSIS — M25572 Pain in left ankle and joints of left foot: Secondary | ICD-10-CM | POA: Diagnosis not present

## 2021-04-22 DIAGNOSIS — M25562 Pain in left knee: Secondary | ICD-10-CM | POA: Insufficient documentation

## 2021-04-22 DIAGNOSIS — E876 Hypokalemia: Secondary | ICD-10-CM | POA: Insufficient documentation

## 2021-04-22 DIAGNOSIS — M1711 Unilateral primary osteoarthritis, right knee: Secondary | ICD-10-CM | POA: Diagnosis not present

## 2021-04-22 DIAGNOSIS — R600 Localized edema: Secondary | ICD-10-CM | POA: Diagnosis not present

## 2021-04-22 LAB — BASIC METABOLIC PANEL
Anion gap: 11 (ref 5–15)
BUN: 13 mg/dL (ref 8–23)
CO2: 19 mmol/L — ABNORMAL LOW (ref 22–32)
Calcium: 9.1 mg/dL (ref 8.9–10.3)
Chloride: 105 mmol/L (ref 98–111)
Creatinine, Ser: 0.75 mg/dL (ref 0.44–1.00)
GFR, Estimated: >60 mL/min (ref >60)
Glucose, Bld: 123 mg/dL — ABNORMAL HIGH (ref 70–99)
Potassium: 3.2 mmol/L — ABNORMAL LOW (ref 3.5–5.1)
Sodium: 135 mmol/L (ref 135–145)

## 2021-04-22 MED ORDER — LIDOCAINE 5 % EX PTCH
2.0000 | MEDICATED_PATCH | CUTANEOUS | Status: DC
  Administered 2021-04-22: 2 via TRANSDERMAL
  Filled 2021-04-22: qty 2

## 2021-04-22 MED ORDER — ACETAMINOPHEN 325 MG PO TABS
650.0000 mg | ORAL_TABLET | Freq: Once | ORAL | Status: DC
  Filled 2021-04-22: qty 2

## 2021-04-22 MED ORDER — LIDOCAINE 5 % EX PTCH: 1.0000 | MEDICATED_PATCH | CUTANEOUS | 0 refills | Status: DC

## 2021-04-22 MED ORDER — POTASSIUM CHLORIDE CRYS ER 20 MEQ PO TBCR
40.0000 meq | EXTENDED_RELEASE_TABLET | Freq: Once | ORAL | Status: AC
  Administered 2021-04-22: 40 meq via ORAL
  Filled 2021-04-22: qty 2

## 2021-04-22 NOTE — ED Notes (Signed)
Pt reports she has taken two 650 mg tablets of tylenol today already. Vernona Rieger, Pa informed and will discontinue ordered dose of Tylenol.

## 2021-04-22 NOTE — ED Triage Notes (Signed)
Pt reports bilateral knee pain that started one week ago associated with swelling to left ankle, painful when ambulating. She denies recent injury. + pedal pulse and sensation

## 2021-04-22 NOTE — ED Provider Notes (Signed)
[facility] EMERGENCY DEPARTMENT Provider Note   CSN: [id number] Arrival date & time: 04/22/21  1540     History  Chief Complaint  Patient presents with   Knee Pain    Kendall Betteann Fizer is a 71 y.o. female.  71 year old female presents with complaint of pain in bilateral knees and her left ankle.  States that she developed pain in her left ankle more so at the left first MTP about a week ago, went to podiatry yesterday and had a steroid injection in the joint.  Patient woke up today with the pain in her knees, called her podiatrist and reported that the pain was not getting any better with her meloxicam and was advised she could take Tylenol.  Patient is taking with Tylenol without any additional relief.  Knee pain was worse throughout the night and upon trying to stand this morning. denies any other joint injuries or pain.  Denies joint redness, states that she does have swelling in her legs which is not usual for her, no history of chronic kidney disease or heart failure, denies shortness of breath.      Home Medications Prior to Admission medications   Medication Sig Start Date End Date Taking? Authorizing Provider  lidocaine (LIDODERM) 5 % Place 1 patch onto the skin daily. Remove & Discard patch within 12 hours or as directed by MD 04/22/21  Yes Jeannie Fend, PA-C  acetaminophen (TYLENOL) 500 MG tablet Take 500 mg by mouth every 4 (four) hours as needed.    [provider]  Cranberry 500 MG CAPS Take by mouth. Patient not taking: No sig reported    [provider]  ibuprofen (ADVIL) 800 MG tablet Take 1 tablet (800 mg total) by mouth every 8 (eight) hours as needed. 06/10/19   Lawyer, Cristal Deer, PA-C  LORazepam (ATIVAN) 1 MG tablet 1 daily prn anxiety 04/14/20   Cottle, Steva Ready., MD  meloxicam (MOBIC) 15 MG tablet Take 1 tablet (15 mg total) by mouth daily. 04/21/21   Louann Sjogren, MD  mirabegron ER (MYRBETRIQ) 50 MG TB24 tablet Take 50 mg by  mouth daily.    [provider]  Multiple Vitamin (MULTIVITAMIN) capsule Take 1 capsule by mouth daily. Patient not taking: Reported on 12/02/2020    [provider]  OVER THE COUNTER MEDICATION 2 chewables    [provider]  PARoxetine (PAXIL) 30 MG tablet Take 1 tablet (30 mg total) by mouth daily. 10/30/20   Cottle, Steva Ready., MD  traZODone (DESYREL) 50 MG tablet Take 1-2 tablets (50-100 mg total) by mouth at bedtime. 01/11/21   Cottle, Steva Ready., MD  triamterene-hydrochlorothiazide (MAXZIDE-25) 37.5-25 MG per tablet Take 0.5 tablets by mouth daily. 03/15/13   [provider]      Allergies    Patient has no known allergies.    Review of Systems   Review of Systems Negative except as per HPI Physical Exam Updated Vital Signs BP (!) 160/88 (BP Location: Left Arm)    Pulse 87    Temp 98.2 F (36.8 C) (Oral)    Resp 20    Ht 5\' 6"  (1.676 m)    Wt 81.6 kg    SpO2 99%    BMI 29.05 kg/m  Physical Exam Vitals and nursing note reviewed.  Constitutional:      General: She is not in acute distress.    Appearance: She is well-developed. She is not diaphoretic.  HENT:  Head: Normocephalic and atraumatic.  Cardiovascular:     Rate and Rhythm: Normal rate and regular rhythm.     Heart sounds: Normal heart sounds.  Pulmonary:     Effort: Pulmonary effort is normal.     Breath sounds: Normal breath sounds.  Musculoskeletal:        General: Tenderness present. No deformity.     Right lower leg: Edema present.     Left lower leg: Edema present.     Comments: Mild pitting edema to lower extremities.  Tenderness at the left first MTP without overlying erythema.  Skin intact, DP pulses present.  Mild tenderness generalized knees.  Skin:    General: Skin is warm and dry.     Findings: No bruising, erythema or rash.  Neurological:     Mental Status: She is alert and oriented to person, place, and time.     Sensory: No sensory deficit.     Motor: No  weakness.  Psychiatric:        Behavior: Behavior normal.    ED Results / Procedures / Treatments   Labs (all labs ordered are listed, but only abnormal results are displayed) Labs Reviewed  BASIC METABOLIC PANEL - Abnormal; Notable for the following components:      Result Value   Potassium 3.2 (*)    CO2 19 (*)    Glucose, Bld 123 (*)    All other components within normal limits    EKG None  Radiology DG Ankle Complete Left  Result Date: 04/22/2021 CLINICAL DATA:  pain EXAM: LEFT ANKLE COMPLETE - 3+ VIEW COMPARISON:  None. FINDINGS: Normal alignment. No acute fracture. Small calcaneal enthesophytes. Normal mineralization. The soft tissues are unremarkable. No joint effusion. IMPRESSION: No malalignment or fracture. Electronically Signed   By: Albin Felling M.D.   On: 04/22/2021 17:02   DG Knee Complete 4 Views Left  Result Date: 04/22/2021 CLINICAL DATA:  pain EXAM: LEFT KNEE - COMPLETE 4+ VIEW COMPARISON:  None. FINDINGS: Normal alignment. No acute fracture. Mild degenerative changes of the medial joint compartment. Normal mineralization. The soft tissues are unremarkable. No joint effusion. IMPRESSION: No malalignment or fracture.  No knee joint effusion. Electronically Signed   By: Albin Felling M.D.   On: 04/22/2021 17:00   DG Knee Complete 4 Views Right  Result Date: 04/22/2021 CLINICAL DATA:  pain EXAM: RIGHT KNEE - COMPLETE 4+ VIEW COMPARISON:  None. FINDINGS: Normal alignment. No acute fracture. Small patellar enthesophyte. Normal mineralization. The soft tissues are unremarkable. No joint effusion. IMPRESSION: No malalignment or fracture.  No knee joint effusion. Electronically Signed   By: Albin Felling M.D.   On: 04/22/2021 17:04    Procedures Procedures    Medications Ordered in ED Medications  potassium chloride SA (KLOR-CON M) CR tablet 40 mEq (has no administration in time range)  lidocaine (LIDODERM) 5 % 2 patch (has no administration in time range)    ED  Course/ Medical Decision Making/ A&P Clinical Course as of 04/22/21 1849  Thu Apr 23, 5055  6142 71 year old female presents with complaint of bilateral knee pain with left foot pain as above.  Note from podiatry visit from yesterday reviewed, injection to the left first MTP.  Patient is taking meloxicam and Tylenol without relief of her pain.  No other arthralgias noted, no falls or injuries.  X-rays are reassuring, no acute bony injury to the knees or ankle.  Patient does have pitting edema to her lower extremities which she believes is  new for her, will evaluate renal function.  Also states her urine has been dark, urinalysis ordered as well. [LM]  1848 Strays negative for acute bony injury of the knees and ankle.  No erythema, do not suspect septic joints.  Pain is limited to her lower extremities.  Found to have mild hypokalemia with potassium 3.2, given oral replacement prior to discharge.  Renal function is normal.  She may continue with her meloxicam as previously prescribed, can take Tylenol, will give Lidoderm patch to apply to knees with recommendation to recheck with PCP. [LM]    Clinical Course User Index [LM] Tacy Learn, PA-C                           Medical Decision Making Amount and/or Complexity of Data Reviewed Labs: ordered. Radiology: ordered.          Final Clinical Impression(s) / ED Diagnoses Final diagnoses:  Polyarthralgia  Hypokalemia    Rx / DC Orders ED Discharge Orders          Ordered    lidocaine (LIDODERM) 5 %  Every 24 hours        04/22/21 1839              Tacy Learn, PA-C 04/22/21 1849    Varney Biles, MD 04/24/21 [id number]

## 2021-04-22 NOTE — Discharge Instructions (Addendum)
Apply lidoderm patch to knees. Continue with the meloxicam as prescribed by your podiatrist.  Your additional 650 mg pain reliever is a Tylenol, you can take this as directed. Follow-up with your primary care doctor for recheck.

## 2021-04-22 NOTE — Telephone Encounter (Signed)
Patient was seen yesterday for an appointment and her foot/leg is still in so much pain she has took the max of the Meloxicam for today  Please advise

## 2021-04-27 DIAGNOSIS — R260 Ataxic gait: Secondary | ICD-10-CM | POA: Diagnosis not present

## 2021-04-27 DIAGNOSIS — N3946 Mixed incontinence: Secondary | ICD-10-CM | POA: Diagnosis not present

## 2021-04-27 DIAGNOSIS — M79604 Pain in right leg: Secondary | ICD-10-CM | POA: Diagnosis not present

## 2021-04-27 DIAGNOSIS — R262 Difficulty in walking, not elsewhere classified: Secondary | ICD-10-CM | POA: Diagnosis not present

## 2021-04-27 DIAGNOSIS — M79605 Pain in left leg: Secondary | ICD-10-CM | POA: Diagnosis not present

## 2021-04-27 DIAGNOSIS — M6281 Muscle weakness (generalized): Secondary | ICD-10-CM | POA: Diagnosis not present

## 2021-04-28 DIAGNOSIS — R260 Ataxic gait: Secondary | ICD-10-CM | POA: Diagnosis not present

## 2021-04-28 DIAGNOSIS — M6281 Muscle weakness (generalized): Secondary | ICD-10-CM | POA: Diagnosis not present

## 2021-04-28 DIAGNOSIS — R262 Difficulty in walking, not elsewhere classified: Secondary | ICD-10-CM | POA: Diagnosis not present

## 2021-04-28 DIAGNOSIS — N3946 Mixed incontinence: Secondary | ICD-10-CM | POA: Diagnosis not present

## 2021-04-28 DIAGNOSIS — M79604 Pain in right leg: Secondary | ICD-10-CM | POA: Diagnosis not present

## 2021-04-28 DIAGNOSIS — M79605 Pain in left leg: Secondary | ICD-10-CM | POA: Diagnosis not present

## 2021-04-29 DIAGNOSIS — M6281 Muscle weakness (generalized): Secondary | ICD-10-CM | POA: Diagnosis not present

## 2021-04-29 DIAGNOSIS — R260 Ataxic gait: Secondary | ICD-10-CM | POA: Diagnosis not present

## 2021-04-29 DIAGNOSIS — N3946 Mixed incontinence: Secondary | ICD-10-CM | POA: Diagnosis not present

## 2021-04-29 DIAGNOSIS — M79605 Pain in left leg: Secondary | ICD-10-CM | POA: Diagnosis not present

## 2021-04-29 DIAGNOSIS — M79604 Pain in right leg: Secondary | ICD-10-CM | POA: Diagnosis not present

## 2021-04-29 DIAGNOSIS — R262 Difficulty in walking, not elsewhere classified: Secondary | ICD-10-CM | POA: Diagnosis not present

## 2021-04-30 DIAGNOSIS — R262 Difficulty in walking, not elsewhere classified: Secondary | ICD-10-CM | POA: Diagnosis not present

## 2021-04-30 DIAGNOSIS — R260 Ataxic gait: Secondary | ICD-10-CM | POA: Diagnosis not present

## 2021-04-30 DIAGNOSIS — M79605 Pain in left leg: Secondary | ICD-10-CM | POA: Diagnosis not present

## 2021-04-30 DIAGNOSIS — M79604 Pain in right leg: Secondary | ICD-10-CM | POA: Diagnosis not present

## 2021-04-30 DIAGNOSIS — M6281 Muscle weakness (generalized): Secondary | ICD-10-CM | POA: Diagnosis not present

## 2021-04-30 DIAGNOSIS — N3946 Mixed incontinence: Secondary | ICD-10-CM | POA: Diagnosis not present

## 2021-05-01 DIAGNOSIS — R260 Ataxic gait: Secondary | ICD-10-CM | POA: Diagnosis not present

## 2021-05-01 DIAGNOSIS — R262 Difficulty in walking, not elsewhere classified: Secondary | ICD-10-CM | POA: Diagnosis not present

## 2021-05-01 DIAGNOSIS — N3946 Mixed incontinence: Secondary | ICD-10-CM | POA: Diagnosis not present

## 2021-05-01 DIAGNOSIS — M79605 Pain in left leg: Secondary | ICD-10-CM | POA: Diagnosis not present

## 2021-05-01 DIAGNOSIS — M79604 Pain in right leg: Secondary | ICD-10-CM | POA: Diagnosis not present

## 2021-05-01 DIAGNOSIS — M6281 Muscle weakness (generalized): Secondary | ICD-10-CM | POA: Diagnosis not present

## 2021-05-04 DIAGNOSIS — M79604 Pain in right leg: Secondary | ICD-10-CM | POA: Diagnosis not present

## 2021-05-04 DIAGNOSIS — R260 Ataxic gait: Secondary | ICD-10-CM | POA: Diagnosis not present

## 2021-05-04 DIAGNOSIS — N3946 Mixed incontinence: Secondary | ICD-10-CM | POA: Diagnosis not present

## 2021-05-04 DIAGNOSIS — R262 Difficulty in walking, not elsewhere classified: Secondary | ICD-10-CM | POA: Diagnosis not present

## 2021-05-04 DIAGNOSIS — M6281 Muscle weakness (generalized): Secondary | ICD-10-CM | POA: Diagnosis not present

## 2021-05-04 DIAGNOSIS — M79605 Pain in left leg: Secondary | ICD-10-CM | POA: Diagnosis not present

## 2021-05-06 DIAGNOSIS — M79604 Pain in right leg: Secondary | ICD-10-CM | POA: Diagnosis not present

## 2021-05-06 DIAGNOSIS — M6281 Muscle weakness (generalized): Secondary | ICD-10-CM | POA: Diagnosis not present

## 2021-05-06 DIAGNOSIS — N3946 Mixed incontinence: Secondary | ICD-10-CM | POA: Diagnosis not present

## 2021-05-06 DIAGNOSIS — M79605 Pain in left leg: Secondary | ICD-10-CM | POA: Diagnosis not present

## 2021-05-06 DIAGNOSIS — R260 Ataxic gait: Secondary | ICD-10-CM | POA: Diagnosis not present

## 2021-05-06 DIAGNOSIS — R262 Difficulty in walking, not elsewhere classified: Secondary | ICD-10-CM | POA: Diagnosis not present

## 2021-05-07 DIAGNOSIS — M79604 Pain in right leg: Secondary | ICD-10-CM | POA: Diagnosis not present

## 2021-05-07 DIAGNOSIS — M79605 Pain in left leg: Secondary | ICD-10-CM | POA: Diagnosis not present

## 2021-05-07 DIAGNOSIS — N3946 Mixed incontinence: Secondary | ICD-10-CM | POA: Diagnosis not present

## 2021-05-07 DIAGNOSIS — R262 Difficulty in walking, not elsewhere classified: Secondary | ICD-10-CM | POA: Diagnosis not present

## 2021-05-07 DIAGNOSIS — M6281 Muscle weakness (generalized): Secondary | ICD-10-CM | POA: Diagnosis not present

## 2021-05-07 DIAGNOSIS — R260 Ataxic gait: Secondary | ICD-10-CM | POA: Diagnosis not present

## 2021-05-10 DIAGNOSIS — M79604 Pain in right leg: Secondary | ICD-10-CM | POA: Diagnosis not present

## 2021-05-10 DIAGNOSIS — N3946 Mixed incontinence: Secondary | ICD-10-CM | POA: Diagnosis not present

## 2021-05-10 DIAGNOSIS — R260 Ataxic gait: Secondary | ICD-10-CM | POA: Diagnosis not present

## 2021-05-10 DIAGNOSIS — M79605 Pain in left leg: Secondary | ICD-10-CM | POA: Diagnosis not present

## 2021-05-10 DIAGNOSIS — R262 Difficulty in walking, not elsewhere classified: Secondary | ICD-10-CM | POA: Diagnosis not present

## 2021-05-10 DIAGNOSIS — M6281 Muscle weakness (generalized): Secondary | ICD-10-CM | POA: Diagnosis not present

## 2021-05-11 DIAGNOSIS — R262 Difficulty in walking, not elsewhere classified: Secondary | ICD-10-CM | POA: Diagnosis not present

## 2021-05-11 DIAGNOSIS — M6281 Muscle weakness (generalized): Secondary | ICD-10-CM | POA: Diagnosis not present

## 2021-05-11 DIAGNOSIS — R260 Ataxic gait: Secondary | ICD-10-CM | POA: Diagnosis not present

## 2021-05-11 DIAGNOSIS — M79604 Pain in right leg: Secondary | ICD-10-CM | POA: Diagnosis not present

## 2021-05-11 DIAGNOSIS — M79605 Pain in left leg: Secondary | ICD-10-CM | POA: Diagnosis not present

## 2021-05-11 DIAGNOSIS — N3946 Mixed incontinence: Secondary | ICD-10-CM | POA: Diagnosis not present

## 2021-05-13 DIAGNOSIS — R260 Ataxic gait: Secondary | ICD-10-CM | POA: Diagnosis not present

## 2021-05-13 DIAGNOSIS — M6281 Muscle weakness (generalized): Secondary | ICD-10-CM | POA: Diagnosis not present

## 2021-05-13 DIAGNOSIS — M79605 Pain in left leg: Secondary | ICD-10-CM | POA: Diagnosis not present

## 2021-05-13 DIAGNOSIS — M79604 Pain in right leg: Secondary | ICD-10-CM | POA: Diagnosis not present

## 2021-05-13 DIAGNOSIS — R262 Difficulty in walking, not elsewhere classified: Secondary | ICD-10-CM | POA: Diagnosis not present

## 2021-05-13 DIAGNOSIS — N3946 Mixed incontinence: Secondary | ICD-10-CM | POA: Diagnosis not present

## 2021-05-14 DIAGNOSIS — M79605 Pain in left leg: Secondary | ICD-10-CM | POA: Diagnosis not present

## 2021-05-14 DIAGNOSIS — N3946 Mixed incontinence: Secondary | ICD-10-CM | POA: Diagnosis not present

## 2021-05-14 DIAGNOSIS — R260 Ataxic gait: Secondary | ICD-10-CM | POA: Diagnosis not present

## 2021-05-14 DIAGNOSIS — M79604 Pain in right leg: Secondary | ICD-10-CM | POA: Diagnosis not present

## 2021-05-14 DIAGNOSIS — R262 Difficulty in walking, not elsewhere classified: Secondary | ICD-10-CM | POA: Diagnosis not present

## 2021-05-14 DIAGNOSIS — M6281 Muscle weakness (generalized): Secondary | ICD-10-CM | POA: Diagnosis not present

## 2021-05-15 DIAGNOSIS — M79605 Pain in left leg: Secondary | ICD-10-CM | POA: Diagnosis not present

## 2021-05-15 DIAGNOSIS — M6281 Muscle weakness (generalized): Secondary | ICD-10-CM | POA: Diagnosis not present

## 2021-05-15 DIAGNOSIS — M79604 Pain in right leg: Secondary | ICD-10-CM | POA: Diagnosis not present

## 2021-05-15 DIAGNOSIS — R260 Ataxic gait: Secondary | ICD-10-CM | POA: Diagnosis not present

## 2021-05-15 DIAGNOSIS — R262 Difficulty in walking, not elsewhere classified: Secondary | ICD-10-CM | POA: Diagnosis not present

## 2021-05-15 DIAGNOSIS — N3946 Mixed incontinence: Secondary | ICD-10-CM | POA: Diagnosis not present

## 2021-05-17 DIAGNOSIS — M6281 Muscle weakness (generalized): Secondary | ICD-10-CM | POA: Diagnosis not present

## 2021-05-17 DIAGNOSIS — R262 Difficulty in walking, not elsewhere classified: Secondary | ICD-10-CM | POA: Diagnosis not present

## 2021-05-17 DIAGNOSIS — M79605 Pain in left leg: Secondary | ICD-10-CM | POA: Diagnosis not present

## 2021-05-17 DIAGNOSIS — R260 Ataxic gait: Secondary | ICD-10-CM | POA: Diagnosis not present

## 2021-05-17 DIAGNOSIS — N3946 Mixed incontinence: Secondary | ICD-10-CM | POA: Diagnosis not present

## 2021-05-17 DIAGNOSIS — M79604 Pain in right leg: Secondary | ICD-10-CM | POA: Diagnosis not present

## 2021-05-18 DIAGNOSIS — R262 Difficulty in walking, not elsewhere classified: Secondary | ICD-10-CM | POA: Diagnosis not present

## 2021-05-18 DIAGNOSIS — N3946 Mixed incontinence: Secondary | ICD-10-CM | POA: Diagnosis not present

## 2021-05-18 DIAGNOSIS — M79604 Pain in right leg: Secondary | ICD-10-CM | POA: Diagnosis not present

## 2021-05-18 DIAGNOSIS — E119 Type 2 diabetes mellitus without complications: Secondary | ICD-10-CM | POA: Diagnosis not present

## 2021-05-18 DIAGNOSIS — E1142 Type 2 diabetes mellitus with diabetic polyneuropathy: Secondary | ICD-10-CM | POA: Diagnosis not present

## 2021-05-18 DIAGNOSIS — I10 Essential (primary) hypertension: Secondary | ICD-10-CM | POA: Diagnosis not present

## 2021-05-18 DIAGNOSIS — R260 Ataxic gait: Secondary | ICD-10-CM | POA: Diagnosis not present

## 2021-05-18 DIAGNOSIS — M6281 Muscle weakness (generalized): Secondary | ICD-10-CM | POA: Diagnosis not present

## 2021-05-18 DIAGNOSIS — I7300 Raynaud's syndrome without gangrene: Secondary | ICD-10-CM | POA: Diagnosis not present

## 2021-05-18 DIAGNOSIS — M79605 Pain in left leg: Secondary | ICD-10-CM | POA: Diagnosis not present

## 2021-05-19 DIAGNOSIS — N3946 Mixed incontinence: Secondary | ICD-10-CM | POA: Diagnosis not present

## 2021-05-19 DIAGNOSIS — M79605 Pain in left leg: Secondary | ICD-10-CM | POA: Diagnosis not present

## 2021-05-19 DIAGNOSIS — M79604 Pain in right leg: Secondary | ICD-10-CM | POA: Diagnosis not present

## 2021-05-19 DIAGNOSIS — R262 Difficulty in walking, not elsewhere classified: Secondary | ICD-10-CM | POA: Diagnosis not present

## 2021-05-19 DIAGNOSIS — R260 Ataxic gait: Secondary | ICD-10-CM | POA: Diagnosis not present

## 2021-05-19 DIAGNOSIS — M6281 Muscle weakness (generalized): Secondary | ICD-10-CM | POA: Diagnosis not present

## 2021-05-20 DIAGNOSIS — R260 Ataxic gait: Secondary | ICD-10-CM | POA: Diagnosis not present

## 2021-05-20 DIAGNOSIS — N3946 Mixed incontinence: Secondary | ICD-10-CM | POA: Diagnosis not present

## 2021-05-20 DIAGNOSIS — M79605 Pain in left leg: Secondary | ICD-10-CM | POA: Diagnosis not present

## 2021-05-20 DIAGNOSIS — R262 Difficulty in walking, not elsewhere classified: Secondary | ICD-10-CM | POA: Diagnosis not present

## 2021-05-20 DIAGNOSIS — M6281 Muscle weakness (generalized): Secondary | ICD-10-CM | POA: Diagnosis not present

## 2021-05-20 DIAGNOSIS — M79604 Pain in right leg: Secondary | ICD-10-CM | POA: Diagnosis not present

## 2021-05-21 DIAGNOSIS — R262 Difficulty in walking, not elsewhere classified: Secondary | ICD-10-CM | POA: Diagnosis not present

## 2021-05-21 DIAGNOSIS — M6281 Muscle weakness (generalized): Secondary | ICD-10-CM | POA: Diagnosis not present

## 2021-05-21 DIAGNOSIS — R260 Ataxic gait: Secondary | ICD-10-CM | POA: Diagnosis not present

## 2021-05-21 DIAGNOSIS — M79604 Pain in right leg: Secondary | ICD-10-CM | POA: Diagnosis not present

## 2021-05-21 DIAGNOSIS — N3946 Mixed incontinence: Secondary | ICD-10-CM | POA: Diagnosis not present

## 2021-05-21 DIAGNOSIS — M79605 Pain in left leg: Secondary | ICD-10-CM | POA: Diagnosis not present

## 2021-05-23 DIAGNOSIS — M6281 Muscle weakness (generalized): Secondary | ICD-10-CM | POA: Diagnosis not present

## 2021-05-23 DIAGNOSIS — M79604 Pain in right leg: Secondary | ICD-10-CM | POA: Diagnosis not present

## 2021-05-23 DIAGNOSIS — R262 Difficulty in walking, not elsewhere classified: Secondary | ICD-10-CM | POA: Diagnosis not present

## 2021-05-23 DIAGNOSIS — M79605 Pain in left leg: Secondary | ICD-10-CM | POA: Diagnosis not present

## 2021-05-23 DIAGNOSIS — R260 Ataxic gait: Secondary | ICD-10-CM | POA: Diagnosis not present

## 2021-05-23 DIAGNOSIS — N3946 Mixed incontinence: Secondary | ICD-10-CM | POA: Diagnosis not present

## 2021-05-24 DIAGNOSIS — M79605 Pain in left leg: Secondary | ICD-10-CM | POA: Diagnosis not present

## 2021-05-24 DIAGNOSIS — R260 Ataxic gait: Secondary | ICD-10-CM | POA: Diagnosis not present

## 2021-05-24 DIAGNOSIS — M6281 Muscle weakness (generalized): Secondary | ICD-10-CM | POA: Diagnosis not present

## 2021-05-24 DIAGNOSIS — N3946 Mixed incontinence: Secondary | ICD-10-CM | POA: Diagnosis not present

## 2021-05-24 DIAGNOSIS — M79604 Pain in right leg: Secondary | ICD-10-CM | POA: Diagnosis not present

## 2021-05-24 DIAGNOSIS — R262 Difficulty in walking, not elsewhere classified: Secondary | ICD-10-CM | POA: Diagnosis not present

## 2021-05-25 DIAGNOSIS — M79605 Pain in left leg: Secondary | ICD-10-CM | POA: Diagnosis not present

## 2021-05-25 DIAGNOSIS — M79604 Pain in right leg: Secondary | ICD-10-CM | POA: Diagnosis not present

## 2021-05-25 DIAGNOSIS — R260 Ataxic gait: Secondary | ICD-10-CM | POA: Diagnosis not present

## 2021-05-25 DIAGNOSIS — R262 Difficulty in walking, not elsewhere classified: Secondary | ICD-10-CM | POA: Diagnosis not present

## 2021-05-25 DIAGNOSIS — N3946 Mixed incontinence: Secondary | ICD-10-CM | POA: Diagnosis not present

## 2021-05-25 DIAGNOSIS — M6281 Muscle weakness (generalized): Secondary | ICD-10-CM | POA: Diagnosis not present

## 2021-05-27 DIAGNOSIS — R260 Ataxic gait: Secondary | ICD-10-CM | POA: Diagnosis not present

## 2021-05-27 DIAGNOSIS — R262 Difficulty in walking, not elsewhere classified: Secondary | ICD-10-CM | POA: Diagnosis not present

## 2021-05-27 DIAGNOSIS — M6281 Muscle weakness (generalized): Secondary | ICD-10-CM | POA: Diagnosis not present

## 2021-05-27 DIAGNOSIS — M79605 Pain in left leg: Secondary | ICD-10-CM | POA: Diagnosis not present

## 2021-05-27 DIAGNOSIS — N3946 Mixed incontinence: Secondary | ICD-10-CM | POA: Diagnosis not present

## 2021-05-27 DIAGNOSIS — M79604 Pain in right leg: Secondary | ICD-10-CM | POA: Diagnosis not present

## 2021-05-28 DIAGNOSIS — R262 Difficulty in walking, not elsewhere classified: Secondary | ICD-10-CM | POA: Diagnosis not present

## 2021-05-28 DIAGNOSIS — M6281 Muscle weakness (generalized): Secondary | ICD-10-CM | POA: Diagnosis not present

## 2021-05-28 DIAGNOSIS — M79604 Pain in right leg: Secondary | ICD-10-CM | POA: Diagnosis not present

## 2021-05-28 DIAGNOSIS — N3946 Mixed incontinence: Secondary | ICD-10-CM | POA: Diagnosis not present

## 2021-05-28 DIAGNOSIS — M79605 Pain in left leg: Secondary | ICD-10-CM | POA: Diagnosis not present

## 2021-05-28 DIAGNOSIS — R260 Ataxic gait: Secondary | ICD-10-CM | POA: Diagnosis not present

## 2021-05-31 DIAGNOSIS — R260 Ataxic gait: Secondary | ICD-10-CM | POA: Diagnosis not present

## 2021-05-31 DIAGNOSIS — N3946 Mixed incontinence: Secondary | ICD-10-CM | POA: Diagnosis not present

## 2021-05-31 DIAGNOSIS — M79604 Pain in right leg: Secondary | ICD-10-CM | POA: Diagnosis not present

## 2021-05-31 DIAGNOSIS — R262 Difficulty in walking, not elsewhere classified: Secondary | ICD-10-CM | POA: Diagnosis not present

## 2021-05-31 DIAGNOSIS — M6281 Muscle weakness (generalized): Secondary | ICD-10-CM | POA: Diagnosis not present

## 2021-05-31 DIAGNOSIS — M79605 Pain in left leg: Secondary | ICD-10-CM | POA: Diagnosis not present

## 2021-06-01 ENCOUNTER — Ambulatory Visit: Payer: PPO | Admitting: Podiatry

## 2021-06-02 DIAGNOSIS — N3946 Mixed incontinence: Secondary | ICD-10-CM | POA: Diagnosis not present

## 2021-06-02 DIAGNOSIS — R260 Ataxic gait: Secondary | ICD-10-CM | POA: Diagnosis not present

## 2021-06-02 DIAGNOSIS — M6281 Muscle weakness (generalized): Secondary | ICD-10-CM | POA: Diagnosis not present

## 2021-06-02 DIAGNOSIS — M79604 Pain in right leg: Secondary | ICD-10-CM | POA: Diagnosis not present

## 2021-06-02 DIAGNOSIS — M79605 Pain in left leg: Secondary | ICD-10-CM | POA: Diagnosis not present

## 2021-06-02 DIAGNOSIS — R262 Difficulty in walking, not elsewhere classified: Secondary | ICD-10-CM | POA: Diagnosis not present

## 2021-06-03 DIAGNOSIS — N3946 Mixed incontinence: Secondary | ICD-10-CM | POA: Diagnosis not present

## 2021-06-03 DIAGNOSIS — M79605 Pain in left leg: Secondary | ICD-10-CM | POA: Diagnosis not present

## 2021-06-03 DIAGNOSIS — M6281 Muscle weakness (generalized): Secondary | ICD-10-CM | POA: Diagnosis not present

## 2021-06-03 DIAGNOSIS — R262 Difficulty in walking, not elsewhere classified: Secondary | ICD-10-CM | POA: Diagnosis not present

## 2021-06-03 DIAGNOSIS — M79604 Pain in right leg: Secondary | ICD-10-CM | POA: Diagnosis not present

## 2021-06-03 DIAGNOSIS — R260 Ataxic gait: Secondary | ICD-10-CM | POA: Diagnosis not present

## 2021-06-04 DIAGNOSIS — M6281 Muscle weakness (generalized): Secondary | ICD-10-CM | POA: Diagnosis not present

## 2021-06-04 DIAGNOSIS — R260 Ataxic gait: Secondary | ICD-10-CM | POA: Diagnosis not present

## 2021-06-04 DIAGNOSIS — N3946 Mixed incontinence: Secondary | ICD-10-CM | POA: Diagnosis not present

## 2021-06-04 DIAGNOSIS — R262 Difficulty in walking, not elsewhere classified: Secondary | ICD-10-CM | POA: Diagnosis not present

## 2021-06-04 DIAGNOSIS — M79605 Pain in left leg: Secondary | ICD-10-CM | POA: Diagnosis not present

## 2021-06-04 DIAGNOSIS — M79604 Pain in right leg: Secondary | ICD-10-CM | POA: Diagnosis not present

## 2021-06-05 ENCOUNTER — Emergency Department (HOSPITAL_BASED_OUTPATIENT_CLINIC_OR_DEPARTMENT_OTHER)
Admission: EM | Admit: 2021-06-05 | Discharge: 2021-06-05 | Disposition: A | Discharge status: Home / Self Care | Payer: PPO | Attending: Student | Admitting: Student

## 2021-06-05 ENCOUNTER — Emergency Department (HOSPITAL_BASED_OUTPATIENT_CLINIC_OR_DEPARTMENT_OTHER): Payer: PPO

## 2021-06-05 ENCOUNTER — Other Ambulatory Visit: Payer: Self-pay

## 2021-06-05 ENCOUNTER — Encounter (HOSPITAL_BASED_OUTPATIENT_CLINIC_OR_DEPARTMENT_OTHER): Payer: Self-pay | Admitting: Emergency Medicine

## 2021-06-05 DIAGNOSIS — E119 Type 2 diabetes mellitus without complications: Secondary | ICD-10-CM | POA: Diagnosis not present

## 2021-06-05 DIAGNOSIS — G319 Degenerative disease of nervous system, unspecified: Secondary | ICD-10-CM | POA: Diagnosis not present

## 2021-06-05 DIAGNOSIS — S0990XA Unspecified injury of head, initial encounter: Secondary | ICD-10-CM | POA: Diagnosis not present

## 2021-06-05 DIAGNOSIS — W19XXXA Unspecified fall, initial encounter: Secondary | ICD-10-CM | POA: Diagnosis not present

## 2021-06-05 DIAGNOSIS — Z043 Encounter for examination and observation following other accident: Secondary | ICD-10-CM | POA: Diagnosis not present

## 2021-06-05 DIAGNOSIS — S0101XA Laceration without foreign body of scalp, initial encounter: Secondary | ICD-10-CM | POA: Insufficient documentation

## 2021-06-05 DIAGNOSIS — I10 Essential (primary) hypertension: Secondary | ICD-10-CM | POA: Diagnosis not present

## 2021-06-05 DIAGNOSIS — R6889 Other general symptoms and signs: Secondary | ICD-10-CM | POA: Diagnosis not present

## 2021-06-05 MED ORDER — LIDOCAINE-EPINEPHRINE-TETRACAINE (LET) TOPICAL GEL
3.0000 mL | Freq: Once | TOPICAL | Status: AC
  Administered 2021-06-05: 3 mL via TOPICAL
  Filled 2021-06-05: qty 3

## 2021-06-05 NOTE — ED Provider Notes (Signed)
?[facility] EMERGENCY DEPARTMENT ?Provider Note ? ? ?CSN: [id number] ?Arrival date & time: 06/05/21  1719 ? ?  ? ?History ? ?Chief Complaint  ?Patient presents with  ? Fall  ? ? ?Candice Velasquez is a 71 y.o. female who presents the emergency department after mechanical fall.  Patient states that she was sitting in a chair, and while trying to get up she believes that she tripped and fell backwards striking her head on the neighboring chair.  She denies loss of consciousness.  She denies any prodrome of lightheadedness or dizziness.  She denies any other trauma besides her head. She is not on chronic anticoagulation. ? ? ?Fall ?Pertinent negatives include no headaches.  ? ?  ? ?Home Medications ?Prior to Admission medications   ?Medication Sig Start Date End Date Taking? Authorizing Provider  ?acetaminophen (TYLENOL) 500 MG tablet Take 500 mg by mouth every 4 (four) hours as needed.    [provider]  ?Cranberry 500 MG CAPS Take by mouth. ?Patient not taking: No sig reported    [provider]  ?ibuprofen (ADVIL) 800 MG tablet Take 1 tablet (800 mg total) by mouth every 8 (eight) hours as needed. 06/10/19   Lawyer, Harrell Gave, PA-C  ?lidocaine (LIDODERM) 5 % Place 1 patch onto the skin daily. Remove & Discard patch within 12 hours or as directed by MD 04/22/21   Tacy Learn, PA-C  ?LORazepam (ATIVAN) 1 MG tablet 1 daily prn anxiety 04/14/20   Cottle, Billey Co., MD  ?meloxicam (MOBIC) 15 MG tablet Take 1 tablet (15 mg total) by mouth daily. 04/21/21   Lorenda Peck, MD  ?mirabegron ER (MYRBETRIQ) 50 MG TB24 tablet Take 50 mg by mouth daily.    [provider]  ?Multiple Vitamin (MULTIVITAMIN) capsule Take 1 capsule by mouth daily. ?Patient not taking: Reported on 12/02/2020    [provider]  ?OVER THE COUNTER MEDICATION 2 chewables    [provider]  ?PARoxetine (PAXIL) 30 MG tablet Take 1 tablet (30 mg total) by mouth daily. 10/30/20   Cottle, Billey Co., MD  ?traZODone (DESYREL) 50 MG tablet Take 1-2 tablets (50-100 mg total) by mouth at bedtime. 01/11/21   Cottle, Billey Co., MD  ?triamterene-hydrochlorothiazide (MAXZIDE-25) 37.5-25 MG per tablet Take 0.5 tablets by mouth daily. 03/15/13   [provider]  ?   ? ?Allergies    ?Eszopiclone and Voltaren [diclofenac]   ? ?Review of Systems   ?Review of Systems  ?Eyes:  Negative for visual disturbance.  ?Skin:   ?     Head laceration  ?Neurological:  Negative for syncope, weakness, light-headedness, numbness and headaches.  ?All other systems reviewed and are negative. ? ?Physical Exam ?Updated Vital Signs ?BP (!) 154/85 (BP Location: Right Arm)   Pulse 66   Temp 98.4 ?F (36.9 ?C) (Oral)   Resp 17   Ht 5\' 6"  (1.676 m)   Wt 81.6 kg   SpO2 98%   BMI 29.05 kg/m?  ?Physical Exam ?Vitals and nursing note reviewed.  ?Constitutional:   ?   Appearance: Normal appearance.  ?HENT:  ?   Head: Normocephalic.  ?   Comments: Approximately 2 inch deep linear laceration to the crown of the head, with oozing of blood. ?Eyes:  ?   Conjunctiva/sclera: Conjunctivae normal.  ?Pulmonary:  ?   Effort: Pulmonary effort is normal. No respiratory distress.  ?Skin: ?   General: Skin is warm and dry.  ?Neurological:  ?  Mental Status: She is alert.  ?Psychiatric:     ?   Mood and Affect: Mood normal.     ?   Behavior: Behavior normal.  ? ? ?ED Results / Procedures / Treatments   ?Labs ?(all labs ordered are listed, but only abnormal results are displayed) ?Labs Reviewed - No data to display ? ?EKG ?None ? ?Radiology ?CT Head Wo Contrast ? ?Result Date: 06/05/2021 ?CLINICAL DATA:  Fall EXAM: CT HEAD WITHOUT CONTRAST TECHNIQUE: Contiguous axial images were obtained from the base of the skull through the vertex without intravenous contrast. RADIATION DOSE REDUCTION: This exam was performed according to the departmental dose-optimization program which includes automated exposure control, adjustment of the mA and/or kV according  to patient size and/or use of iterative reconstruction technique. COMPARISON:  None. FINDINGS: Brain: There is no mass, hemorrhage or extra-axial collection. There is generalized atrophy without lobar predilection. Hypodensity of the white matter is most commonly associated with chronic microvascular disease. There is an old left cerebellar infarct. Vascular: No abnormal hyperdensity of the major intracranial arteries or dural venous sinuses. No intracranial atherosclerosis. Skull: The visualized skull base, calvarium and extracranial soft tissues are normal. Sinuses/Orbits: No fluid levels or advanced mucosal thickening of the visualized paranasal sinuses. No mastoid or middle ear effusion. The orbits are normal. IMPRESSION: 1. No acute intracranial abnormality. 2. Old left cerebellar infarct. 3. Hypodensity of the white matter is most commonly associated with chronic microvascular disease. Electronically Signed   By: Ulyses Jarred M.D.   On: 06/05/2021 20:03   ? ?Procedures ?Marland Kitchen.Laceration Repair ? ?Date/Time: 06/05/2021 7:43 PM ?Performed by: Kateri Plummer, PA-C ?Authorized by: Kateri Plummer, PA-C  ? ?Consent:  ?  Consent obtained:  Verbal ?  Consent given by:  Patient ?  Risks discussed:  Infection, need for additional repair, pain, poor cosmetic result and poor wound healing ?  Alternatives discussed:  No treatment and delayed treatment ?Universal protocol:  ?  Procedure explained and questions answered to patient or proxy's satisfaction: yes   ?  Relevant documents present and verified: yes   ?  Test results available: yes   ?  Imaging studies available: yes   ?  Required blood products, implants, devices, and special equipment available: yes   ?  Site/side marked: yes   ?  Immediately prior to procedure, a time out was called: yes   ?  Patient identity confirmed:  Verbally with patient ?Anesthesia:  ?  Anesthesia method:  Local infiltration and topical application ?  Topical anesthetic:   LET ?Laceration details:  ?  Location:  Scalp ?  Scalp location:  Crown ?Pre-procedure details:  ?  Preparation:  Patient was prepped and draped in usual sterile fashion and imaging obtained to evaluate for foreign bodies ?Exploration:  ?  Hemostasis achieved with:  LET ?  Imaging obtained comment:  CT head ?  Imaging outcome: foreign body not noted   ?Treatment:  ?  Area cleansed with:  Shur-Clens ?  Amount of cleaning:  Standard ?Skin repair:  ?  Repair method:  Staples ?  Number of staples:  4 ?Approximation:  ?  Approximation:  Close ?Repair type:  ?  Repair type:  Simple ?Post-procedure details:  ?  Dressing:  Non-adherent dressing ?  Procedure completion:  Tolerated well, no immediate complications  ? ? ?Medications Ordered in ED ?Medications  ?lidocaine-EPINEPHrine-tetracaine (LET) topical gel (3 mLs Topical Given 06/05/21 1843)  ?lidocaine-EPINEPHrine-tetracaine (LET) topical gel (3 mLs Topical Given 06/05/21  1935)  ? ? ?ED Course/ Medical Decision Making/ A&P ?  ?                        ?Medical Decision Making ? ?This patient is a 71 year old female who presents to the ED for concern of fall and head trauma.  ? ?Past Medical History / Co-morbidities: ?Diabetes, hypertension, depression, frequent falls ? ?Physical Exam: ?Physical exam performed. The pertinent findings include: Deep linear laceration to the crown of the head with oozing of blood ? ?Lab Tests/Imaging studies: ?I Ordered, and personally interpreted labs/imaging including CT head without.  The pertinent results include: No acute intracranial abnormality. I agree with the radiologist interpretation. ?  ?Procedures: ?Scalp laceration anesthestized with LET gel and cleaned. Repaired with 4 staples. Patient tolerated procedure well.  ?  ?Disposition: ?After consideration of the diagnostic results and the patients response to treatment, I feel that patient's not requiring admission or inpatient treatment for her symptoms.  Head laceration repaired  without difficulty.  Discussed follow-up protocol to have the staples removed.  Discussed reasons to return to the emergency department, and the patient is agreeable to the plan.  ? ?I discussed this case with my attending phy

## 2021-06-05 NOTE — ED Notes (Signed)
Pt AOx4, GCS 15 ?

## 2021-06-05 NOTE — Discharge Instructions (Addendum)
You are seen in the emergency department after fall today. ? ?We have stapled your head laceration.  We also obtained imaging of your head, which showed no acute fractures or any brain bleeds. ? ?You should have the staples removed in 1 week.  You can have them removed at any doctor's office, or the emergency department.  It is fine to wash your hair gently.  I have also attached some other instructions for how to care for the area.  You can take ibuprofen or Tylenol as needed for pain. ?

## 2021-06-05 NOTE — ED Triage Notes (Signed)
Pt BIB [facility] with c/o mechanical fall. Denies loc, denies thinners, c/o lac posterior head. Bleeding controlled at this time. Pt denies neck pain ?

## 2021-06-08 DIAGNOSIS — N3946 Mixed incontinence: Secondary | ICD-10-CM | POA: Diagnosis not present

## 2021-06-08 DIAGNOSIS — M79604 Pain in right leg: Secondary | ICD-10-CM | POA: Diagnosis not present

## 2021-06-08 DIAGNOSIS — M6281 Muscle weakness (generalized): Secondary | ICD-10-CM | POA: Diagnosis not present

## 2021-06-08 DIAGNOSIS — R260 Ataxic gait: Secondary | ICD-10-CM | POA: Diagnosis not present

## 2021-06-08 DIAGNOSIS — M79605 Pain in left leg: Secondary | ICD-10-CM | POA: Diagnosis not present

## 2021-06-08 DIAGNOSIS — R262 Difficulty in walking, not elsewhere classified: Secondary | ICD-10-CM | POA: Diagnosis not present

## 2021-06-09 DIAGNOSIS — M79605 Pain in left leg: Secondary | ICD-10-CM | POA: Diagnosis not present

## 2021-06-09 DIAGNOSIS — R260 Ataxic gait: Secondary | ICD-10-CM | POA: Diagnosis not present

## 2021-06-09 DIAGNOSIS — N3946 Mixed incontinence: Secondary | ICD-10-CM | POA: Diagnosis not present

## 2021-06-09 DIAGNOSIS — R262 Difficulty in walking, not elsewhere classified: Secondary | ICD-10-CM | POA: Diagnosis not present

## 2021-06-09 DIAGNOSIS — M79604 Pain in right leg: Secondary | ICD-10-CM | POA: Diagnosis not present

## 2021-06-09 DIAGNOSIS — M6281 Muscle weakness (generalized): Secondary | ICD-10-CM | POA: Diagnosis not present

## 2021-06-10 DIAGNOSIS — M79672 Pain in left foot: Secondary | ICD-10-CM | POA: Diagnosis not present

## 2021-06-10 DIAGNOSIS — R2689 Other abnormalities of gait and mobility: Secondary | ICD-10-CM | POA: Diagnosis not present

## 2021-06-10 DIAGNOSIS — M79604 Pain in right leg: Secondary | ICD-10-CM | POA: Diagnosis not present

## 2021-06-10 DIAGNOSIS — M79605 Pain in left leg: Secondary | ICD-10-CM | POA: Diagnosis not present

## 2021-06-10 DIAGNOSIS — R42 Dizziness and giddiness: Secondary | ICD-10-CM | POA: Diagnosis not present

## 2021-06-10 DIAGNOSIS — N3946 Mixed incontinence: Secondary | ICD-10-CM | POA: Diagnosis not present

## 2021-06-10 DIAGNOSIS — R413 Other amnesia: Secondary | ICD-10-CM | POA: Diagnosis not present

## 2021-06-10 DIAGNOSIS — R262 Difficulty in walking, not elsewhere classified: Secondary | ICD-10-CM | POA: Diagnosis not present

## 2021-06-10 DIAGNOSIS — M6281 Muscle weakness (generalized): Secondary | ICD-10-CM | POA: Diagnosis not present

## 2021-06-10 DIAGNOSIS — R260 Ataxic gait: Secondary | ICD-10-CM | POA: Diagnosis not present

## 2021-06-11 DIAGNOSIS — N3946 Mixed incontinence: Secondary | ICD-10-CM | POA: Diagnosis not present

## 2021-06-11 DIAGNOSIS — M79605 Pain in left leg: Secondary | ICD-10-CM | POA: Diagnosis not present

## 2021-06-11 DIAGNOSIS — M79604 Pain in right leg: Secondary | ICD-10-CM | POA: Diagnosis not present

## 2021-06-11 DIAGNOSIS — R262 Difficulty in walking, not elsewhere classified: Secondary | ICD-10-CM | POA: Diagnosis not present

## 2021-06-11 DIAGNOSIS — R260 Ataxic gait: Secondary | ICD-10-CM | POA: Diagnosis not present

## 2021-06-11 DIAGNOSIS — M6281 Muscle weakness (generalized): Secondary | ICD-10-CM | POA: Diagnosis not present

## 2021-06-12 DIAGNOSIS — M79605 Pain in left leg: Secondary | ICD-10-CM | POA: Diagnosis not present

## 2021-06-12 DIAGNOSIS — M79604 Pain in right leg: Secondary | ICD-10-CM | POA: Diagnosis not present

## 2021-06-12 DIAGNOSIS — R260 Ataxic gait: Secondary | ICD-10-CM | POA: Diagnosis not present

## 2021-06-12 DIAGNOSIS — M6281 Muscle weakness (generalized): Secondary | ICD-10-CM | POA: Diagnosis not present

## 2021-06-12 DIAGNOSIS — N3946 Mixed incontinence: Secondary | ICD-10-CM | POA: Diagnosis not present

## 2021-06-12 DIAGNOSIS — R262 Difficulty in walking, not elsewhere classified: Secondary | ICD-10-CM | POA: Diagnosis not present

## 2021-06-14 ENCOUNTER — Emergency Department (HOSPITAL_BASED_OUTPATIENT_CLINIC_OR_DEPARTMENT_OTHER)
Admission: EM | Admit: 2021-06-14 | Discharge: 2021-06-14 | Disposition: A | Discharge status: Home / Self Care | Payer: PPO | Attending: Emergency Medicine | Admitting: Emergency Medicine

## 2021-06-14 ENCOUNTER — Encounter (HOSPITAL_BASED_OUTPATIENT_CLINIC_OR_DEPARTMENT_OTHER): Payer: Self-pay

## 2021-06-14 ENCOUNTER — Other Ambulatory Visit: Payer: Self-pay

## 2021-06-14 DIAGNOSIS — M79604 Pain in right leg: Secondary | ICD-10-CM | POA: Diagnosis not present

## 2021-06-14 DIAGNOSIS — N3946 Mixed incontinence: Secondary | ICD-10-CM | POA: Diagnosis not present

## 2021-06-14 DIAGNOSIS — S0990XD Unspecified injury of head, subsequent encounter: Secondary | ICD-10-CM | POA: Diagnosis not present

## 2021-06-14 DIAGNOSIS — Z4802 Encounter for removal of sutures: Secondary | ICD-10-CM | POA: Diagnosis not present

## 2021-06-14 DIAGNOSIS — S0101XD Laceration without foreign body of scalp, subsequent encounter: Secondary | ICD-10-CM | POA: Diagnosis not present

## 2021-06-14 DIAGNOSIS — R262 Difficulty in walking, not elsewhere classified: Secondary | ICD-10-CM | POA: Diagnosis not present

## 2021-06-14 DIAGNOSIS — M79605 Pain in left leg: Secondary | ICD-10-CM | POA: Diagnosis not present

## 2021-06-14 DIAGNOSIS — R260 Ataxic gait: Secondary | ICD-10-CM | POA: Diagnosis not present

## 2021-06-14 DIAGNOSIS — M6281 Muscle weakness (generalized): Secondary | ICD-10-CM | POA: Diagnosis not present

## 2021-06-14 NOTE — Discharge Instructions (Signed)
Return if any problems.

## 2021-06-14 NOTE — ED Triage Notes (Signed)
Pt for staple removal to scalp-placed 3/18-NAD- to triage with own rollator ?

## 2021-06-14 NOTE — ED Notes (Signed)
Here for staple removal at post portion of parietal area, no bleeding, redness or swelling noted. PA at bedside to remove the 4 staples. Pt tolerated procedure well.  ?

## 2021-06-15 ENCOUNTER — Ambulatory Visit: Payer: PPO | Admitting: Podiatry

## 2021-06-15 ENCOUNTER — Encounter: Payer: Self-pay | Admitting: Podiatry

## 2021-06-15 DIAGNOSIS — M19079 Primary osteoarthritis, unspecified ankle and foot: Secondary | ICD-10-CM | POA: Diagnosis not present

## 2021-06-15 DIAGNOSIS — M778 Other enthesopathies, not elsewhere classified: Secondary | ICD-10-CM | POA: Diagnosis not present

## 2021-06-15 MED ORDER — MELOXICAM 15 MG PO TABS: 15.0000 mg | ORAL_TABLET | Freq: Every day | ORAL | 1 refills | Status: DC

## 2021-06-15 NOTE — Progress Notes (Signed)
?  Subjective:  ?Patient ID: Candice Velasquez, female    DOB: 1950-07-05,   MRN: [id number] ? ?Chief Complaint  ?Patient presents with  ? Foot Pain  ?  L FOOT PAIN  ? ? ?71 y.o. female presents forfollow-up of left foot pain. Relates she is doing much better and not having much pain anymore. Would like a refill for meloxicam in case she flares again. Relates he right foot is doing well and the injection helped previously. . Denies any other pedal complaints. Denies n/v/f/c.  ? ?Past Medical History:  ?Diagnosis Date  ? Depression   ? Diabetes mellitus without complication (HCC)   ? Falling   ? Hypertension   ? ? ?Objective:  ?Physical Exam: ?Vascular: DP/PT pulses 2/4 bilateral. CFT <3 seconds. Normal hair growth on digits. No edema.  ?Skin. No lacerations or abrasions bilateral feet.  ?Musculoskeletal: MMT 5/5 bilateral lower extremities in DF, PF, Inversion and Eversion. Deceased ROM in DF of ankle joint. Tender over the first TMTJ on the left minimal today.   ?Neurological: Sensation intact to light touch.  ? ?Assessment:  ? ?1. Capsulitis of left foot   ?2. Arthritis of midfoot   ? ? ? ?Plan:  ?Patient was evaluated and treated and all questions answered. ?Discussed midfoot arthritis with patient and treatment options.  ?Doing well ?Discussed NSAIDS, topicals,. ?Refill for meloxicam provided as needed.  ?No injection today.  ?Discussed stiff soled shoes and carbon fiber foot plate.  ?Discussed if pain does not improve can discuss surgical options.  ?Patient to follow-up as needed.  ? ? ?Louann Sjogren, DPM  ? ? ?

## 2021-06-17 DIAGNOSIS — M6281 Muscle weakness (generalized): Secondary | ICD-10-CM | POA: Diagnosis not present

## 2021-06-17 DIAGNOSIS — M79605 Pain in left leg: Secondary | ICD-10-CM | POA: Diagnosis not present

## 2021-06-17 DIAGNOSIS — M79604 Pain in right leg: Secondary | ICD-10-CM | POA: Diagnosis not present

## 2021-06-17 DIAGNOSIS — R260 Ataxic gait: Secondary | ICD-10-CM | POA: Diagnosis not present

## 2021-06-17 DIAGNOSIS — R262 Difficulty in walking, not elsewhere classified: Secondary | ICD-10-CM | POA: Diagnosis not present

## 2021-06-17 DIAGNOSIS — N3946 Mixed incontinence: Secondary | ICD-10-CM | POA: Diagnosis not present

## 2021-06-17 NOTE — ED Provider Notes (Signed)
?[facility] EMERGENCY DEPARTMENT ?Provider Note ? ? ?CSN: [id number] ?Arrival date & time: 06/14/21  1145 ? ?  ? ?History ? ?Chief Complaint  ?Patient presents with  ? Suture / Staple Removal  ? ? ?Candice Velasquez is a 71 y.o. female. ? ?Pt here for staple removal.  Pt denies any complaints.  Pt had staples placed a week ago on 3/18 ? ?The history is provided by the patient. No language interpreter was used.  ?Suture / Staple Removal ?This is a new problem. The problem occurs constantly. The problem has not changed since onset.Pertinent negatives include no headaches. Nothing aggravates the symptoms. Nothing relieves the symptoms. She has tried nothing for the symptoms. The treatment provided no relief.  ? ?  ? ?Home Medications ?Prior to Admission medications   ?Medication Sig Start Date End Date Taking? Authorizing Provider  ?acetaminophen (TYLENOL) 500 MG tablet Take 500 mg by mouth every 4 (four) hours as needed.    [provider]  ?Cranberry 500 MG CAPS Take by mouth. ?Patient not taking: No sig reported    [provider]  ?ibuprofen (ADVIL) 800 MG tablet Take 1 tablet (800 mg total) by mouth every 8 (eight) hours as needed. 06/10/19   Lawyer, Cristal Deer, PA-C  ?lidocaine (LIDODERM) 5 % Place 1 patch onto the skin daily. Remove & Discard patch within 12 hours or as directed by MD 04/22/21   Jeannie Fend, PA-C  ?LORazepam (ATIVAN) 1 MG tablet 1 daily prn anxiety 04/14/20   Cottle, Steva Ready., MD  ?meloxicam (MOBIC) 15 MG tablet Take 1 tablet (15 mg total) by mouth daily. 06/15/21   Louann Sjogren, MD  ?mirabegron ER (MYRBETRIQ) 50 MG TB24 tablet Take 50 mg by mouth daily.    [provider]  ?Multiple Vitamin (MULTIVITAMIN) capsule Take 1 capsule by mouth daily. ?Patient not taking: Reported on 12/02/2020    [provider]  ?OVER THE COUNTER MEDICATION 2 chewables    [provider]  ?PARoxetine (PAXIL) 30 MG tablet Take 1 tablet (30 mg total) by  mouth daily. 10/30/20   Cottle, Steva Ready., MD  ?traZODone (DESYREL) 50 MG tablet Take 1-2 tablets (50-100 mg total) by mouth at bedtime. 01/11/21   Cottle, Steva Ready., MD  ?triamterene-hydrochlorothiazide (MAXZIDE-25) 37.5-25 MG per tablet Take 0.5 tablets by mouth daily. 03/15/13   [provider]  ?   ? ?Allergies    ?Eszopiclone and Voltaren [diclofenac]   ? ?Review of Systems   ?Review of Systems  ?Neurological:  Negative for headaches.  ?All other systems reviewed and are negative. ? ?Physical Exam ?Updated Vital Signs ?BP (!) 168/74 (BP Location: Left Arm)   Pulse 70   Temp 98.1 ?F (36.7 ?C) (Oral)   Resp 18   SpO2 100%  ?Physical Exam ?Vitals reviewed.  ?Constitutional:   ?   Appearance: Normal appearance.  ?HENT:  ?   Head: Normocephalic.  ?   Comments: Healing laceration scalp ?   Mouth/Throat:  ?   Mouth: Mucous membranes are moist.  ?Eyes:  ?   Pupils: Pupils are equal, round, and reactive to light.  ?Cardiovascular:  ?   Rate and Rhythm: Normal rate.  ?Pulmonary:  ?   Effort: Pulmonary effort is normal.  ?Neurological:  ?   General: No focal deficit present.  ?   Mental Status: She is alert.  ?Psychiatric:     ?   Mood and Affect: Mood normal.  ? ? ?ED  Results / Procedures / Treatments   ?Labs ?(all labs ordered are listed, but only abnormal results are displayed) ?Labs Reviewed - No data to display ? ?EKG ?None ? ?Radiology ?No results found. ? ?Procedures ?Procedures  ? ? ?Medications Ordered in ED ?Medications - No data to display ? ?ED Course/ Medical Decision Making/ A&P ?  ?                        ?Medical Decision Making ? ?Staples removed.  ? ? ? ? ? ? ? ?Final Clinical Impression(s) / ED Diagnoses ?Final diagnoses:  ?Encounter for staple removal  ? ? ?Rx / DC Orders ?ED Discharge Orders   ? ? None  ? ?  ? ?An After Visit Summary was printed and given to the patient. ? ?  ?Elson Areas, [place] ?06/17/21 1516 ? ?  ?Linwood Dibbles, MD ?06/18/21 [phone] ? ?

## 2021-06-18 DIAGNOSIS — M79605 Pain in left leg: Secondary | ICD-10-CM | POA: Diagnosis not present

## 2021-06-18 DIAGNOSIS — M6281 Muscle weakness (generalized): Secondary | ICD-10-CM | POA: Diagnosis not present

## 2021-06-18 DIAGNOSIS — R262 Difficulty in walking, not elsewhere classified: Secondary | ICD-10-CM | POA: Diagnosis not present

## 2021-06-18 DIAGNOSIS — N3946 Mixed incontinence: Secondary | ICD-10-CM | POA: Diagnosis not present

## 2021-06-18 DIAGNOSIS — R260 Ataxic gait: Secondary | ICD-10-CM | POA: Diagnosis not present

## 2021-06-18 DIAGNOSIS — M79604 Pain in right leg: Secondary | ICD-10-CM | POA: Diagnosis not present

## 2021-06-29 ENCOUNTER — Ambulatory Visit: Payer: PPO | Admitting: Podiatry

## 2021-07-05 ENCOUNTER — Telehealth: Payer: Self-pay | Admitting: Psychiatry

## 2021-07-05 DIAGNOSIS — F411 Generalized anxiety disorder: Secondary | ICD-10-CM

## 2021-07-05 DIAGNOSIS — F331 Major depressive disorder, recurrent, moderate: Secondary | ICD-10-CM

## 2021-07-05 NOTE — Telephone Encounter (Signed)
Patient called in regards to prior message. States that she is extremely dizzy to the point that the hospital has her as a fall risk. She thinks this is due to her not taking the Paroxetine the way that she should she states she take sporadically. ?

## 2021-07-05 NOTE — Telephone Encounter (Signed)
Patient called in regarding her Paroxetine 30mg  medication. States that she hasn't been taking it regularly. She believes it may be making her dizzy and she would like a rtc from [facility] to discuss if she should continue taking it or not. Ph: [phone] ?

## 2021-07-06 MED ORDER — PAROXETINE HCL 30 MG PO TABS: 30.0000 mg | ORAL_TABLET | Freq: Every day | ORAL | 0 refills | Status: AC

## 2021-07-06 NOTE — Telephone Encounter (Signed)
Patient has been dizzy and she said you told her she would have this when weaning off the paroxetine, but she said you told her you knew how to take care of it. Patient is only periodically taking the paroxetine, stating she is too lazy to take it. She is not taking the Maxzide consistently either. She is currently living in an adult living facility in [address] [facility] [place]. I have asked adm to schedule an appt to F/U. Patient was last seen 9/14 with RTC in 6-8 weeks.  ?

## 2021-07-06 NOTE — Telephone Encounter (Signed)
Tried calling patient - no answer and on way to leave a message. Will continue to try contacting.  ?

## 2021-07-06 NOTE — Telephone Encounter (Signed)
Rx for paroxetine sent. Told her to try using a pillbox so hopefully she will be more consistent in taking her medication. She asked for a RF sent to a new pharmacy, which was done. She said he needed a RF also on her BP medication and I told her to contact whoever prescribes it and ask for a RF. She said she would do this.  ?

## 2021-07-06 NOTE — Telephone Encounter (Signed)
We are giving her paroxetine to help with the depression and anxiety.  It is not going to work if she is just taking it sporadically.  And it will cause dizziness if she misses doses.  Asked her she would consider using a pillbox and taking it very consistently so that we can see if it would work better for her depression and anxiety than the previous meds have worked?  If she cannot do that then let us know and we will see about taking her off of it. ?

## 2021-07-06 NOTE — Telephone Encounter (Signed)
Please call patient to schedule an appt.-

## 2021-12-19 ENCOUNTER — Inpatient Hospital Stay (HOSPITAL_BASED_OUTPATIENT_CLINIC_OR_DEPARTMENT_OTHER)
Admission: EM | Admit: 2021-12-19 | Discharge: 2021-12-27 | DRG: 149 | Disposition: A | Discharge status: Skilled Nursing Facility | Payer: Medicare HMO | Attending: Internal Medicine | Admitting: Internal Medicine

## 2021-12-19 ENCOUNTER — Encounter (HOSPITAL_BASED_OUTPATIENT_CLINIC_OR_DEPARTMENT_OTHER): Payer: Self-pay | Admitting: Emergency Medicine

## 2021-12-19 ENCOUNTER — Other Ambulatory Visit: Payer: Self-pay

## 2021-12-19 ENCOUNTER — Emergency Department (HOSPITAL_BASED_OUTPATIENT_CLINIC_OR_DEPARTMENT_OTHER): Payer: Medicare HMO

## 2021-12-19 ENCOUNTER — Encounter (HOSPITAL_COMMUNITY): Payer: Self-pay

## 2021-12-19 DIAGNOSIS — J449 Chronic obstructive pulmonary disease, unspecified: Secondary | ICD-10-CM | POA: Diagnosis present

## 2021-12-19 DIAGNOSIS — R748 Abnormal levels of other serum enzymes: Secondary | ICD-10-CM | POA: Diagnosis present

## 2021-12-19 DIAGNOSIS — M791 Myalgia, unspecified site: Secondary | ICD-10-CM | POA: Diagnosis present

## 2021-12-19 DIAGNOSIS — Z87891 Personal history of nicotine dependence: Secondary | ICD-10-CM

## 2021-12-19 DIAGNOSIS — G4733 Obstructive sleep apnea (adult) (pediatric): Secondary | ICD-10-CM | POA: Diagnosis present

## 2021-12-19 DIAGNOSIS — F419 Anxiety disorder, unspecified: Secondary | ICD-10-CM | POA: Diagnosis present

## 2021-12-19 DIAGNOSIS — G20C Parkinsonism, unspecified: Secondary | ICD-10-CM

## 2021-12-19 DIAGNOSIS — E876 Hypokalemia: Secondary | ICD-10-CM | POA: Diagnosis present

## 2021-12-19 DIAGNOSIS — R42 Dizziness and giddiness: Secondary | ICD-10-CM | POA: Diagnosis not present

## 2021-12-19 DIAGNOSIS — F5105 Insomnia due to other mental disorder: Secondary | ICD-10-CM | POA: Diagnosis present

## 2021-12-19 DIAGNOSIS — Z0189 Encounter for other specified special examinations: Secondary | ICD-10-CM

## 2021-12-19 DIAGNOSIS — I10 Essential (primary) hypertension: Secondary | ICD-10-CM | POA: Diagnosis present

## 2021-12-19 DIAGNOSIS — Z8673 Personal history of transient ischemic attack (TIA), and cerebral infarction without residual deficits: Secondary | ICD-10-CM

## 2021-12-19 DIAGNOSIS — Z888 Allergy status to other drugs, medicaments and biological substances status: Secondary | ICD-10-CM

## 2021-12-19 DIAGNOSIS — Z90710 Acquired absence of both cervix and uterus: Secondary | ICD-10-CM

## 2021-12-19 DIAGNOSIS — R413 Other amnesia: Secondary | ICD-10-CM | POA: Diagnosis present

## 2021-12-19 DIAGNOSIS — R270 Ataxia, unspecified: Principal | ICD-10-CM

## 2021-12-19 DIAGNOSIS — F32A Depression, unspecified: Secondary | ICD-10-CM | POA: Diagnosis present

## 2021-12-19 DIAGNOSIS — M25551 Pain in right hip: Secondary | ICD-10-CM

## 2021-12-19 DIAGNOSIS — Z79899 Other long term (current) drug therapy: Secondary | ICD-10-CM

## 2021-12-19 DIAGNOSIS — Z791 Long term (current) use of non-steroidal anti-inflammatories (NSAID): Secondary | ICD-10-CM

## 2021-12-19 DIAGNOSIS — E119 Type 2 diabetes mellitus without complications: Secondary | ICD-10-CM | POA: Diagnosis present

## 2021-12-19 DIAGNOSIS — R260 Ataxic gait: Secondary | ICD-10-CM

## 2021-12-19 LAB — CBC
HCT: 41.6 % (ref 36.0–46.0)
Hemoglobin: 14.4 g/dL (ref 12.0–15.0)
MCH: 32.3 pg (ref 26.0–34.0)
MCHC: 34.6 g/dL (ref 30.0–36.0)
MCV: 93.3 fL (ref 80.0–100.0)
Platelets: 236 10*3/uL (ref 150–400)
RBC: 4.46 MIL/uL (ref 3.87–5.11)
RDW: 13.2 % (ref 11.5–15.5)
WBC: 7.7 10*3/uL (ref 4.0–10.5)
nRBC: 0 % (ref 0.0–0.2)

## 2021-12-19 LAB — COMPREHENSIVE METABOLIC PANEL
ALT: 15 U/L (ref 0–44)
AST: 19 U/L (ref 15–41)
Albumin: 4.6 g/dL (ref 3.5–5.0)
Alkaline Phosphatase: 52 U/L (ref 38–126)
Anion gap: 9 (ref 5–15)
BUN: 11 mg/dL (ref 8–23)
CO2: 24 mmol/L (ref 22–32)
Calcium: 9.3 mg/dL (ref 8.9–10.3)
Chloride: 109 mmol/L (ref 98–111)
Creatinine, Ser: 0.87 mg/dL (ref 0.44–1.00)
GFR, Estimated: >60 mL/min (ref >60)
Glucose, Bld: 131 mg/dL — ABNORMAL HIGH (ref 70–99)
Potassium: 3.8 mmol/L (ref 3.5–5.1)
Sodium: 142 mmol/L (ref 135–145)
Total Bilirubin: 0.8 mg/dL (ref 0.3–1.2)
Total Protein: 7.8 g/dL (ref 6.5–8.1)

## 2021-12-19 LAB — URINALYSIS, ROUTINE W REFLEX MICROSCOPIC
Glucose, UA: NEGATIVE mg/dL
Hgb urine dipstick: NEGATIVE
Ketones, ur: NEGATIVE mg/dL
Leukocytes,Ua: NEGATIVE
Nitrite: NEGATIVE
Protein, ur: NEGATIVE mg/dL
Specific Gravity, Urine: >=1.03 (ref 1.005–1.030)
pH: 5.5 (ref 5.0–8.0)

## 2021-12-19 LAB — LIPASE, BLOOD: Lipase: 67 U/L — ABNORMAL HIGH (ref 11–51)

## 2021-12-19 LAB — CBG MONITORING, ED: Glucose-Capillary: 132 mg/dL — ABNORMAL HIGH (ref 70–99)

## 2021-12-19 MED ORDER — MORPHINE SULFATE (PF) 4 MG/ML IV SOLN
4.0000 mg | Freq: Once | INTRAVENOUS | Status: AC
  Administered 2021-12-19: 4 mg via INTRAVENOUS
  Filled 2021-12-19: qty 1

## 2021-12-19 MED ORDER — SODIUM CHLORIDE 0.9 % IV BOLUS (SEPSIS)
1000.0000 mL | Freq: Once | INTRAVENOUS | Status: AC
  Administered 2021-12-19: 1000 mL via INTRAVENOUS

## 2021-12-19 MED ORDER — OXYCODONE-ACETAMINOPHEN 5-325 MG PO TABS
1.0000 | ORAL_TABLET | Freq: Once | ORAL | Status: AC
  Administered 2021-12-19: 1 via ORAL
  Filled 2021-12-19: qty 1

## 2021-12-19 MED ORDER — SODIUM CHLORIDE 0.9 % IV SOLN
1000.0000 mL | INTRAVENOUS | Status: DC
  Administered 2021-12-19 – 2021-12-20 (×3): 1000 mL via INTRAVENOUS

## 2021-12-19 MED ORDER — MELOXICAM 15 MG PO TABS: 15.0000 mg | ORAL_TABLET | Freq: Every day | ORAL | 1 refills | Status: DC

## 2021-12-19 MED ORDER — LIDOCAINE 5 % EX PTCH: 1.0000 | MEDICATED_PATCH | CUTANEOUS | 0 refills | Status: DC

## 2021-12-19 MED ORDER — ONDANSETRON HCL 4 MG/2ML IJ SOLN
4.0000 mg | Freq: Once | INTRAMUSCULAR | Status: AC
  Administered 2021-12-19: 4 mg via INTRAVENOUS
  Filled 2021-12-19: qty 2

## 2021-12-19 NOTE — ED Provider Notes (Addendum)
[facility] Provider Note   CSN: [id number] Arrival date & time: 12/19/21  2725     History  Chief complaint: Pain in right hip groin  Candice Velasquez is a 71 y.o. female.  HPI   Patient has a history of anxiety and depression, COPD, drug-induced akathisia, drug-induced tremor and gait instability.  Patient also has chronic vertigo issues.  Patient states she woke up this morning complaining of pain in her right groin hip area.  It is sharp.  It increases when she moves her leg.  It is very painful.  Patient denies any abdominal pain.  No dysuria.  No numbness in her extremities.  She is feeling weak.  She is also feeling dizzy.  She states the dizziness however has been going on for weeks.  The daughter adds the patient has a chronic history of vertigo.  Patient has had some diarrhea last few days.  She also has possibly missed some of her medications.  No complaints of headaches.  No fevers.  She denies any back pain.  Home Medications Prior to Admission medications   Medication Sig Start Date End Date Taking? Authorizing Provider  acetaminophen (TYLENOL) 500 MG tablet Take 500 mg by mouth every 4 (four) hours as needed.    [provider]  Cranberry 500 MG CAPS Take by mouth. Patient not taking: No sig reported    [provider]  ibuprofen (ADVIL) 800 MG tablet Take 1 tablet (800 mg total) by mouth every 8 (eight) hours as needed. 06/10/19   Lawyer, Harrell Gave, PA-C  lidocaine (LIDODERM) 5 % Place 1 patch onto the skin daily. Remove & Discard patch within 12 hours or as directed by MD 12/19/21   Dorie Rank, MD  meloxicam (MOBIC) 15 MG tablet Take 1 tablet (15 mg total) by mouth daily. 12/19/21   Dorie Rank, MD  mirabegron ER (MYRBETRIQ) 50 MG TB24 tablet Take 50 mg by mouth daily.    [provider]  Multiple Vitamin (MULTIVITAMIN) capsule Take 1 capsule by mouth daily. Patient not taking: Reported on 12/02/2020    [provider]  OVER THE COUNTER MEDICATION 2 chewables    [provider]  PARoxetine (PAXIL) 30 MG tablet Take 1 tablet (30 mg total) by mouth daily. 07/06/21   Cottle, Billey Co., MD  traZODone (DESYREL) 50 MG tablet Take 1-2 tablets (50-100 mg total) by mouth at bedtime. 01/11/21   Cottle, Billey Co., MD  triamterene-hydrochlorothiazide (MAXZIDE-25) 37.5-25 MG per tablet Take 0.5 tablets by mouth daily. 03/15/13   [provider]      Allergies    Eszopiclone and Voltaren [diclofenac]    Review of Systems   Review of Systems  Physical Exam Updated Vital Signs BP (!) 150/82   Pulse 86   Temp 98 F (36.7 C)   Resp 15   Ht 1.676 m (5\' 6" )   Wt 81.6 kg   SpO2 98%   BMI 29.05 kg/m  Physical Exam  ED Results / Procedures / Treatments   Labs (all labs ordered are listed, but only abnormal results are displayed) Labs Reviewed  COMPREHENSIVE METABOLIC PANEL - Abnormal; Notable for the following components:      Result Value   Glucose, Bld 131 (*)    All other components within normal limits  URINALYSIS, ROUTINE W REFLEX MICROSCOPIC - Abnormal; Notable for the following components:   APPearance HAZY (*)    Bilirubin Urine SMALL (*)  All other components within normal limits  LIPASE, BLOOD - Abnormal; Notable for the following components:   Lipase 67 (*)    All other components within normal limits  CBG MONITORING, ED - Abnormal; Notable for the following components:   Glucose-Capillary 132 (*)    All other components within normal limits  CBC    EKG None  Radiology CT Head Wo Contrast  Result Date: 12/19/2021 CLINICAL DATA:  Dizziness for weeks.  Tremor. EXAM: CT HEAD WITHOUT CONTRAST TECHNIQUE: Contiguous axial images were obtained from the base of the skull through the vertex without intravenous contrast. RADIATION DOSE REDUCTION: This exam was performed according to the departmental dose-optimization program which includes automated exposure  control, adjustment of the mA and/or kV according to patient size and/or use of iterative reconstruction technique. COMPARISON:  CT head 06/05/2021 FINDINGS: Brain: No evidence of acute infarction, hemorrhage, hydrocephalus, extra-axial collection or mass lesion/mass effect. Generalized parenchymal volume loss, similar to prior exam. Scattered hypodensities in the periventricular and subcortical white matter, consistent with chronic small-vessel ischemic changes. Chronic left cerebellar infarct. Vascular: No hyperdense vessel or unexpected calcification. Skull: Normal. Negative for fracture or focal lesion. Sinuses/Orbits: No acute finding. The visualized paranasal sinuses and mastoid air cells are clear. Other: None. IMPRESSION: 1. No acute intracranial abnormality. 2. Generalized parenchymal volume loss and chronic small-vessel ischemic changes, similar to most recent prior exam. Chronic left cerebellar infarct. Electronically Signed   By: Sherron Ales M.D.   On: 12/19/2021 12:33   CT Renal Stone Study  Result Date: 12/19/2021 CLINICAL DATA:  Flank pain.  Kidney stone. EXAM: CT ABDOMEN AND PELVIS WITHOUT CONTRAST TECHNIQUE: Multidetector CT imaging of the abdomen and pelvis was performed following the standard protocol without IV contrast. RADIATION DOSE REDUCTION: This exam was performed according to the departmental dose-optimization program which includes automated exposure control, adjustment of the mA and/or kV according to patient size and/or use of iterative reconstruction technique. COMPARISON:  None Available. FINDINGS: Lower chest: No acute abnormality. Hepatobiliary: No focal liver abnormality is seen. No gallstones, gallbladder wall thickening, or biliary dilatation. Pancreas: Equivocal peripancreatic haziness identified. No definite pancreatic edema. No signs of main duct dilatation or mass. Spleen: Normal in size without focal abnormality. Adrenals/Urinary Tract: Normal adrenal glands. Punctate  stone within upper pole of right kidney noted, image 38/4. No left renal calculi. No kidney mass or hydronephrosis identified. No hydroureter identified. Urinary bladder is unremarkable. Stomach/Bowel: Small hiatal hernia. The appendix is visualized and appears normal. There is no bowel wall thickening, inflammation, or distension. Vascular/Lymphatic: No significant vascular findings are present. No enlarged abdominal or pelvic lymph nodes. Reproductive: Status post hysterectomy. No adnexal masses. Other: No free fluid or fluid collections. No signs of pneumoperitoneum. Musculoskeletal: No acute abnormality. Thoracolumbar spondylosis noted. IMPRESSION: 1. Punctate nonobstructing upper pole right renal calculus. No signs of hydronephrosis, hydroureter or ureteral calculi. 2. Equivocal peripancreatic haziness identified. Correlate for any clinical signs or symptoms of acute pancreatitis. 3. Small hiatal hernia. Electronically Signed   By: Signa Kell M.D.   On: 12/19/2021 09:20   DG Hip Unilat W or Wo Pelvis 2-3 Views Right  Result Date: 12/19/2021 CLINICAL DATA:  Right hip pain for awhile EXAM: DG HIP (WITH OR WITHOUT PELVIS) 2-3V RIGHT COMPARISON:  None Available. FINDINGS: No evidence of fracture or bone lesion. Mild degenerative spurring symmetrically the hips. Osteopenia and lower lumbar spine degeneration. IMPRESSION: 1. No acute or focal finding. 2. Symmetric, mild hip degeneration. Electronically Signed   By: Christiane Ha  Watts M.D.   On: 12/19/2021 08:00    Procedures Procedures    Medications Ordered in ED Medications  sodium chloride 0.9 % bolus 1,000 mL (0 mLs Intravenous Stopping previously hung infusion 12/19/21 1025)    Followed by  0.9 %  sodium chloride infusion (has no administration in time range)  morphine (PF) 4 MG/ML injection 4 mg (4 mg Intravenous Given 12/19/21 0814)  ondansetron (ZOFRAN) injection 4 mg (4 mg Intravenous Given 12/19/21 4098)    ED Course/ Medical Decision  Making/ A&P Clinical Course as of 12/19/21 1324  Sun Dec 19, 2021  0834 Urinalysis, Routine w reflex microscopic(!) Urinalysis shows small bilirubin [JK]  0834 CBC CBC normal [JK]  0834 Comprehensive metabolic panel(!) Metabolic panel normal [JK]  0836 Patient states her pain has improved. [JK]  0916 Mild hip degeneration noted on films [JK]  0951 CT scan does not show any ureteral stone.  Questionable haziness around the pancreas.  We will add on a lipase [JK]  1125 Lipase, blood(!) Lipase slightly elevated at 67.  Patient reexamined and she does not have any upper abdominal pain. [JK]  1239 CT Head Wo Contrast CT without acute changes. [JK]  1323 Case discussed with Dr. Ronaldo Miyamoto regarding admission [JK]    Clinical Course User Index [JK] Linwood Dibbles, MD                           Medical Decision Making Problems Addressed: Ataxia: undiagnosed new problem with uncertain prognosis Dizziness: chronic illness or injury Pain of right hip: acute illness or injury Parkinsonism, unspecified Parkinsonism type: chronic illness or injury  Amount and/or Complexity of Data Reviewed External Data Reviewed:     Details: Outpatient records reviewed.  Patient has been evaluated by neurology at [facility].  Patient has known memory impairment.  She has had outpatient MRI that showed diffuse cortical atrophy.  Patient has had dizziness felt most likely to be vestibular in nature.  She does have imbalance issues felt to be related to peripheral neuropathy.  Patient also has had cerebellar lacunar infarcts. Labs: ordered. Decision-making details documented in ED Course. Radiology: ordered. Decision-making details documented in ED Course. Discussion of management or test interpretation with external provider(s):    Risk Prescription drug management. Parenteral controlled substances. Decision regarding hospitalization.   Patient evaluated for primarily right hip pain.  Patient describes sharp  pain in her hip area.  She denies any abdominal pain.  No acute numbness or weakness.  Patient did have some pain with range of motion of her leg.  She is not having any findings to suggest infection.  No signs of vascular compromise.  X-rays showed mild arthritis.  There is also concerned about the possibility of referred pain such as a kidney stone so CT scan was performed.  There is no ureteral stone or other acute abnormality.  There was question of some mild haziness in the pancreas region.  Lipase was slightly elevated but she is not having any abdominal tenderness I do not think this is related to her symptoms but do recommend repeat testing as an outpatient.  I discussed this with the patient and included this instruction in her discharge instructions.  Suspect her hip pain could be radicular in nature.  She did have MRI in 2021 that showed evidence of spinal stenosis.  She also had diffuse degenerative disease.  Suspect this is an exacerbation of that condition.  Will treat with medications  for pain.  Recommend outpatient follow-up for reassessment  Patient was initially somewhat diaphoretic but that has improved with pain management.  She there are no signs of acute faction.  She is hemodynamically stable.  Suspect this could have been related to the pain she was experiencing   Patient also mentions some trouble with dizziness.  This is not a new condition for her.  I was able to review her outpatient records and she has been seen by neurology for this.  She does have history of Parkinson's.  She has also had an old cerebellar infarct.  Patient however does feel like her dizziness is worse today.  We tried to get her up and have her stand and she was unable to walk.  Patient has had a prior cerebellar stroke.  I am concerned the possibility of a recurrent stroke.  We do not have MRI at this freestanding ED.  Patient will require admission to the hospital for further evaluation.     Final Clinical  Impression(s) / ED Diagnoses Final diagnoses:  Pain of right hip  Parkinsonism, unspecified Parkinsonism type  Dizziness  Ataxia     Linwood Dibbles, MD 12/19/21 1324

## 2021-12-19 NOTE — ED Triage Notes (Signed)
Reports dizziness "for weeks", family member states this has been going on for a couple years. Pt has a tremor, family states she has had it but its worse than usual. Pt appears pale and diaphoretic. Pt states she has had GI issues the past few days and isn't sure if she has missed some medications. Pt has a hard time recalling which medicines she hasn't taken and for how long.

## 2021-12-19 NOTE — Progress Notes (Signed)
Plan of Care Note for accepted transfer   Patient: Candice Velasquez MRN: [id number]   [place]: 12/19/2021  Facility requesting transfer: [facility] Requesting Provider: Dr. Tomi Bamberger Reason for transfer: Dizziness and ataxia, concern for CVA Facility course: 71 yo F w/ PMHx of Parkinson's, CVA. Presenting with generalized weakness and dizziness. CTH unrevealing. She does have worsening tremor, dizziness and ataxia per EDP. Requesting admission for concern of new TIA/CVA.   Plan of care: The patient is accepted for admission to Telemetry unit, at [facility]. While holding at [facility], medical decision making responsibilities remain with the [place]. Upon arrival to [facility] [place] will assume care. Thank you.  Author: Jonnie Finner, DO 12/19/2021  Check [email or web address] for on-call coverage.  Nursing staff, Please call [place] number on Amion as soon as patient's arrival, so appropriate admitting provider can evaluate the pt.

## 2021-12-19 NOTE — ED Notes (Signed)
Talked with pt about her concerns and apologized about the Purewick. Increased suction to ensure moisture is being wicked away fully. She feels like she is remaining wet, although her underwear is completely dry, purewick is in place accurately and the urine is in the suction canister. She explains she is just overall uncomfortable, unable to sit up or stand due to pain, etc. TV turned on and cell phone placed within reach in order to attempt to aid pt in distracting herself a little.

## 2021-12-20 ENCOUNTER — Other Ambulatory Visit (HOSPITAL_COMMUNITY): Payer: Medicare HMO

## 2021-12-20 DIAGNOSIS — Z791 Long term (current) use of non-steroidal anti-inflammatories (NSAID): Secondary | ICD-10-CM | POA: Diagnosis not present

## 2021-12-20 DIAGNOSIS — R42 Dizziness and giddiness: Secondary | ICD-10-CM | POA: Diagnosis present

## 2021-12-20 DIAGNOSIS — F32A Depression, unspecified: Secondary | ICD-10-CM | POA: Diagnosis not present

## 2021-12-20 DIAGNOSIS — Z79899 Other long term (current) drug therapy: Secondary | ICD-10-CM | POA: Diagnosis not present

## 2021-12-20 DIAGNOSIS — R413 Other amnesia: Secondary | ICD-10-CM

## 2021-12-20 DIAGNOSIS — F5105 Insomnia due to other mental disorder: Secondary | ICD-10-CM | POA: Diagnosis not present

## 2021-12-20 DIAGNOSIS — E876 Hypokalemia: Secondary | ICD-10-CM | POA: Diagnosis not present

## 2021-12-20 DIAGNOSIS — Z888 Allergy status to other drugs, medicaments and biological substances status: Secondary | ICD-10-CM | POA: Diagnosis not present

## 2021-12-20 DIAGNOSIS — G4733 Obstructive sleep apnea (adult) (pediatric): Secondary | ICD-10-CM | POA: Diagnosis not present

## 2021-12-20 DIAGNOSIS — Z8673 Personal history of transient ischemic attack (TIA), and cerebral infarction without residual deficits: Secondary | ICD-10-CM | POA: Diagnosis not present

## 2021-12-20 DIAGNOSIS — M791 Myalgia, unspecified site: Secondary | ICD-10-CM | POA: Diagnosis not present

## 2021-12-20 DIAGNOSIS — I10 Essential (primary) hypertension: Secondary | ICD-10-CM | POA: Diagnosis not present

## 2021-12-20 DIAGNOSIS — M25551 Pain in right hip: Secondary | ICD-10-CM | POA: Diagnosis not present

## 2021-12-20 DIAGNOSIS — R748 Abnormal levels of other serum enzymes: Secondary | ICD-10-CM

## 2021-12-20 DIAGNOSIS — J449 Chronic obstructive pulmonary disease, unspecified: Secondary | ICD-10-CM | POA: Diagnosis not present

## 2021-12-20 DIAGNOSIS — Z90710 Acquired absence of both cervix and uterus: Secondary | ICD-10-CM | POA: Diagnosis not present

## 2021-12-20 DIAGNOSIS — E119 Type 2 diabetes mellitus without complications: Secondary | ICD-10-CM | POA: Diagnosis not present

## 2021-12-20 DIAGNOSIS — G20C Parkinsonism, unspecified: Secondary | ICD-10-CM | POA: Diagnosis not present

## 2021-12-20 DIAGNOSIS — Z87891 Personal history of nicotine dependence: Secondary | ICD-10-CM | POA: Diagnosis not present

## 2021-12-20 DIAGNOSIS — F419 Anxiety disorder, unspecified: Secondary | ICD-10-CM | POA: Diagnosis not present

## 2021-12-20 LAB — HEMOGLOBIN A1C
Hgb A1c MFr Bld: 5.2 % (ref 4.8–5.6)
Mean Plasma Glucose: 102.54 mg/dL

## 2021-12-20 LAB — TSH: TSH: 1.089 u[IU]/mL (ref 0.350–4.500)

## 2021-12-20 LAB — LIPASE, BLOOD: Lipase: 26 U/L (ref 11–51)

## 2021-12-20 MED ORDER — ASPIRIN 325 MG PO TABS
325.0000 mg | ORAL_TABLET | Freq: Once | ORAL | Status: AC
  Administered 2021-12-20: 325 mg via ORAL
  Filled 2021-12-20: qty 1

## 2021-12-20 MED ORDER — PAROXETINE HCL 30 MG PO TABS
30.0000 mg | ORAL_TABLET | Freq: Every day | ORAL | Status: DC
  Administered 2021-12-20 – 2021-12-27 (×8): 30 mg via ORAL
  Filled 2021-12-20 (×8): qty 1

## 2021-12-20 MED ORDER — TRAZODONE HCL 50 MG PO TABS
50.0000 mg | ORAL_TABLET | Freq: Every day | ORAL | Status: DC
  Administered 2021-12-20 – 2021-12-26 (×7): 50 mg via ORAL
  Filled 2021-12-20 (×7): qty 1

## 2021-12-20 MED ORDER — ASPIRIN 81 MG PO TBEC
81.0000 mg | DELAYED_RELEASE_TABLET | Freq: Every day | ORAL | Status: DC
  Administered 2021-12-21 – 2021-12-27 (×7): 81 mg via ORAL
  Filled 2021-12-20 (×7): qty 1

## 2021-12-20 MED ORDER — TRIAMTERENE-HCTZ 37.5-25 MG PO TABS
0.5000 | ORAL_TABLET | Freq: Every day | ORAL | Status: DC
  Filled 2021-12-20: qty 0.5

## 2021-12-20 MED ORDER — SODIUM CHLORIDE 0.9 % IV SOLN
1000.0000 mL | INTRAVENOUS | Status: DC
  Administered 2021-12-20 – 2021-12-21 (×2): 1000 mL via INTRAVENOUS

## 2021-12-20 MED ORDER — STROKE: EARLY STAGES OF RECOVERY BOOK
Freq: Once | Status: AC
  Filled 2021-12-20: qty 1

## 2021-12-20 MED ORDER — KETOROLAC TROMETHAMINE 30 MG/ML IJ SOLN
15.0000 mg | Freq: Once | INTRAMUSCULAR | Status: AC
  Administered 2021-12-20: 15 mg via INTRAVENOUS
  Filled 2021-12-20: qty 1

## 2021-12-20 MED ORDER — MORPHINE SULFATE (PF) 4 MG/ML IV SOLN
4.0000 mg | Freq: Once | INTRAVENOUS | Status: AC
  Administered 2021-12-20: 4 mg via INTRAVENOUS
  Filled 2021-12-20: qty 1

## 2021-12-20 MED ORDER — FESOTERODINE FUMARATE ER 4 MG PO TB24: 4.0000 mg | ORAL_TABLET | Freq: Every day | ORAL | Status: DC

## 2021-12-20 NOTE — Assessment & Plan Note (Addendum)
71 year old who presented with complaint of worsening dizziness, hip pain and worsening tremor x 1 day -obs to telemetry for TIA work up as well as orthostasis and vertigo -CT head with no acute finding -MRI brain ordered -call neurology if abnormal MRI  -orthostatic vitals -hold hctz/triamteren bp and Candice Velasquez -suspicion due to not taking her paxil as prescribed as can have dizziness and withdrawal when stopping  -TED hose -Neurochecks per protocol -echo -ASA 325mg  now and continue asa 81mg   -N.p.o. until bedside swallow screen -PT/ OT

## 2021-12-20 NOTE — Progress Notes (Signed)
Patient arrived from [facility] via [facility]; oriented to room and unit routine. Fall safety reviewed; bed low and locked.

## 2021-12-20 NOTE — ED Provider Notes (Signed)
9:40 AM.  Called to patient's room.  She is complaining of severe pain in the tops of her legs at her hips.  She said its been going on for 2 days.  She is moaning in pain and diaphoretic.  She denies any chest pain or shortness of breath.  Legs are nonmottled, normal pulses and sensation.  Will provide some pain medication. Ecg ordered.    Hayden Rasmussen, MD 12/20/21 [phone]

## 2021-12-20 NOTE — Assessment & Plan Note (Signed)
Has been forgetting morning meds and not taking consistently. Stopping paxil cold [place] could be contributing to some of her symptoms Discussed importance of taking prescribed medication Discussed with daughter pill container to help with her memory deficit and taking medication in the AM

## 2021-12-20 NOTE — ED Notes (Addendum)
Resting quietly at this time.

## 2021-12-20 NOTE — Assessment & Plan Note (Signed)
Hx of OSA, but not using cpap in past notes Continue trazodone prn

## 2021-12-20 NOTE — ED Notes (Signed)
Nurse rounding done, all safety measures for [facility] client in place.

## 2021-12-20 NOTE — Assessment & Plan Note (Signed)
Recently seen by neurology with further testing recommended  delirium precautions

## 2021-12-20 NOTE — H&P (Signed)
History and Physical    Patient: Candice Velasquez YBO:[id number] DOB: 11/28/1950 DOA: 12/19/2021 DOS: the patient was seen and examined on 12/20/2021 PCP: Merri Brunette, MD  Patient coming from:  [facility]  - lives alone in an independent care at the [facility]. She uses a walker.    Chief Complaint: dizziness/generalized weakness   HPI: Candice Velasquez is a 71 y.o. female with medical history significant of anxiety and depression, HTN, dizziness/vertigo, hx of cerebellar lacunar infarct, memory impairment who presented to ED with complaints of dizziness and weakness.  When I ask her what prompted her to go to ED, she says, " I dont' know why Im here and don't know."  Per ED note she came in for right hip pain and complained of worsening dizziness. She does admit that it's been worse than normal when I tell her this. She states it has been worse than normal for about one months time. She stopped vestibular therapy. She states she has not been, sick but [place] and she thinks it makes her sick. She denies any focal deficits or slurred speech. She states she really only gets dizzy with postural changes and only lasts a few seconds. Goes away if she stays still. No ringing or hearing loss in ears.   Per her daughter they thought her tremors were worse than normal when she saw her yesterday AM. She was very sweaty and ? If she had a panic attack. She told her daughter she woke up 2AM with pain in both of her hips. Due to hip pain, worsening tremor and dizziness they took her to ED. Her daughter states he was able to walk in to ED fine on her own.   Seen by neurology in 10/2021 with complaints of dizziness at that time. Suspected largely vestibular involvement and undergoing vestibular rehab.   History of tremors.neurology note: 11/21:  Negative EMG studies. Doubted primary PD. ? Medication induced akathesia and tremors.    Denies any fever/chills, vision changes/headaches,  chest pain or palpitations, shortness of breath or cough, abdominal pain, N/V, dysuria or leg swelling. She had some episodes of diarrhea 3-4 days ago, but none since that time.    She does not smoke or drink alcohol daily. Socially drinks.   ER Course:  vitals: afebrile, bp: 139/95, HR: 94, RR: 22, oxygen: 99%RA\ Pertinent labs: lipase 67 CT head: no acute finding. 2. Generalized parenchymal volume loss and chronic small-vessel ischemic changes, similar to most recent prior exam. Chronic left cerebellar infarct. CT renal study: 1. Punctate nonobstructing upper pole right renal calculus. No signs of hydronephrosis, hydroureter or ureteral calculi. 2. Equivocal peripancreatic haziness identified. Correlate for any clinical signs or symptoms of acute pancreatitis. 3. Small hiatal hernia. Right hip xray: negative In ED: given toradol, morphine, oxycodone, NS 1L bolus and maintenance IVF. TRH asked to admit.    Review of Systems: As mentioned in the history of present illness. All other systems reviewed and are negative. Past Medical History:  Diagnosis Date   Depression    Diabetes mellitus without complication (HCC)    Falling    Hypertension    Past Surgical History:  Procedure Laterality Date   ABDOMINAL HYSTERECTOMY     Social History:  reports that she quit smoking about 6 years ago. Her smoking use included cigarettes. She has a 31.00 pack-year smoking history. She has never used smokeless tobacco. She reports current alcohol use. She reports that she does not  currently use drugs.  Allergies  Allergen Reactions   Lunesta [Eszopiclone] Other (See Comments)    muscle pain, foul taste in mouth   Voltaren [Diclofenac] Nausea And Vomiting    No family history on file.  Prior to Admission medications   Medication Sig Start Date End Date Taking? Authorizing Provider  acetaminophen (TYLENOL) 500 MG tablet Take 500 mg by mouth every 4 (four) hours as needed for mild pain or  moderate pain.    [provider]  Cranberry 500 MG CAPS Take by mouth. Patient not taking: Reported on 08/26/2020    [provider]  GEMTESA 75 MG TABS Take 1 tablet by mouth daily. 07/16/21   [provider]  ibuprofen (ADVIL) 800 MG tablet Take 1 tablet (800 mg total) by mouth every 8 (eight) hours as needed. Patient taking differently: Take 800 mg by mouth every 8 (eight) hours as needed for mild pain or moderate pain. 06/10/19   Lawyer, Cristal Deer, PA-C  lidocaine (LIDODERM) 5 % Place 1 patch onto the skin daily. Remove & Discard patch within 12 hours or as directed by MD 12/19/21   Linwood Dibbles, MD  meloxicam (MOBIC) 15 MG tablet Take 1 tablet (15 mg total) by mouth daily. 12/19/21   Linwood Dibbles, MD  mirabegron ER (MYRBETRIQ) 50 MG TB24 tablet Take 50 mg by mouth daily.    [provider]  Multiple Vitamin (MULTIVITAMIN) capsule Take 1 capsule by mouth daily. Patient not taking: Reported on 12/02/2020    [provider]  PARoxetine (PAXIL) 30 MG tablet Take 1 tablet (30 mg total) by mouth daily. 07/06/21   Cottle, Steva Ready., MD  solifenacin (VESICARE) 10 MG tablet Take 10 mg by mouth daily. 10/27/21   [provider]  traZODone (DESYREL) 50 MG tablet Take 1-2 tablets (50-100 mg total) by mouth at bedtime. 01/11/21   Cottle, Steva Ready., MD  triamterene-hydrochlorothiazide (MAXZIDE-25) 37.5-25 MG per tablet Take 0.5 tablets by mouth daily. 03/15/13   [provider]    Physical Exam: Vitals:   12/20/21 0930 12/20/21 0945 12/20/21 1030 12/20/21 1417  BP: (!) 153/84   (!) 144/69  Pulse: 97 96  85  Resp: (!) 30 (!) 21  18  Temp:   98.6 F (37 C) 98.7 F (37.1 C)  TempSrc:    Oral  SpO2: 100% 100%  98%  Weight:      Height:       General:  Appears calm and comfortable and is in NAD Eyes:  PERRL, EOMI, normal lids, iris ENT:  grossly normal hearing, lips & tongue, mmm; appropriate dentition Neck:  no LAD, masses or thyromegaly;  no carotid bruits Cardiovascular:  RRR, no m/r/g. No LE edema.  Respiratory:   CTA bilaterally with no wheezes/rales/rhonchi.  Normal respiratory effort. Abdomen:  soft, NT, ND, NABS Back:   normal alignment, no CVAT Skin:  no rash or induration seen on limited exam Musculoskeletal:  grossly normal tone BUE/BLE, good ROM, no bony abnormality Lower extremity:  No LE edema.  Limited foot exam with no ulcerations.  2+ distal pulses. Psychiatric:  grossly normal mood and affect, speech fluent and appropriate, Aox3. Memory deficits.  Neurologic:  CN 2-12 grossly intact, moves all extremities in coordinated fashion, sensation intact. HTN intact bilaterally. FTN intact bilaterally. +tremor worse with intentional movement. Dix-halpike negative.    Radiological Exams on Admission: Independently reviewed - see discussion in A/P where applicable  CT Head Wo Contrast  Result Date: 12/19/2021  CLINICAL DATA:  Dizziness for weeks.  Tremor. EXAM: CT HEAD WITHOUT CONTRAST TECHNIQUE: Contiguous axial images were obtained from the base of the skull through the vertex without intravenous contrast. RADIATION DOSE REDUCTION: This exam was performed according to the departmental dose-optimization program which includes automated exposure control, adjustment of the mA and/or kV according to patient size and/or use of iterative reconstruction technique. COMPARISON:  CT head 06/05/2021 FINDINGS: Brain: No evidence of acute infarction, hemorrhage, hydrocephalus, extra-axial collection or mass lesion/mass effect. Generalized parenchymal volume loss, similar to prior exam. Scattered hypodensities in the periventricular and subcortical white matter, consistent with chronic small-vessel ischemic changes. Chronic left cerebellar infarct. Vascular: No hyperdense vessel or unexpected calcification. Skull: Normal. Negative for fracture or focal lesion. Sinuses/Orbits: No acute finding. The visualized paranasal sinuses and mastoid air  cells are clear. Other: None. IMPRESSION: 1. No acute intracranial abnormality. 2. Generalized parenchymal volume loss and chronic small-vessel ischemic changes, similar to most recent prior exam. Chronic left cerebellar infarct. Electronically Signed   By: Ileana Roup M.D.   On: 12/19/2021 12:33   CT Renal Stone Study  Result Date: 12/19/2021 CLINICAL DATA:  Flank pain.  Kidney stone. EXAM: CT ABDOMEN AND PELVIS WITHOUT CONTRAST TECHNIQUE: Multidetector CT imaging of the abdomen and pelvis was performed following the standard protocol without IV contrast. RADIATION DOSE REDUCTION: This exam was performed according to the departmental dose-optimization program which includes automated exposure control, adjustment of the mA and/or kV according to patient size and/or use of iterative reconstruction technique. COMPARISON:  None Available. FINDINGS: Lower chest: No acute abnormality. Hepatobiliary: No focal liver abnormality is seen. No gallstones, gallbladder wall thickening, or biliary dilatation. Pancreas: Equivocal peripancreatic haziness identified. No definite pancreatic edema. No signs of main duct dilatation or mass. Spleen: Normal in size without focal abnormality. Adrenals/Urinary Tract: Normal adrenal glands. Punctate stone within upper pole of right kidney noted, image 38/4. No left renal calculi. No kidney mass or hydronephrosis identified. No hydroureter identified. Urinary bladder is unremarkable. Stomach/Bowel: Small hiatal hernia. The appendix is visualized and appears normal. There is no bowel wall thickening, inflammation, or distension. Vascular/Lymphatic: No significant vascular findings are present. No enlarged abdominal or pelvic lymph nodes. Reproductive: Status post hysterectomy. No adnexal masses. Other: No free fluid or fluid collections. No signs of pneumoperitoneum. Musculoskeletal: No acute abnormality. Thoracolumbar spondylosis noted. IMPRESSION: 1. Punctate nonobstructing upper pole  right renal calculus. No signs of hydronephrosis, hydroureter or ureteral calculi. 2. Equivocal peripancreatic haziness identified. Correlate for any clinical signs or symptoms of acute pancreatitis. 3. Small hiatal hernia. Electronically Signed   By: Kerby Moors M.D.   On: 12/19/2021 09:20   DG Hip Unilat W or Wo Pelvis 2-3 Views Right  Result Date: 12/19/2021 CLINICAL DATA:  Right hip pain for awhile EXAM: DG HIP (WITH OR WITHOUT PELVIS) 2-3V RIGHT COMPARISON:  None Available. FINDINGS: No evidence of fracture or bone lesion. Mild degenerative spurring symmetrically the hips. Osteopenia and lower lumbar spine degeneration. IMPRESSION: 1. No acute or focal finding. 2. Symmetric, mild hip degeneration. Electronically Signed   By: Jorje Guild M.D.   On: 12/19/2021 08:00    EKG: Independently reviewed.  NSR with rate 85; nonspecific ST changes with no evidence of acute ischemia. LBBB. Lots of artifact, no previous ekg. Will repeat    Labs on Admission: I have personally reviewed the available labs and imaging studies at the time of the admission.  Pertinent labs:   Lipase 67   Assessment and Plan: Principal  Problem:   Dizziness Active Problems:   Elevated lipase   Anxiety and depression   Insomnia due to other mental disorder   Memory loss    Assessment and Plan: * Dizziness 71 year old who presented with complaint of worsening dizziness -obs to telemetry for TIA work up as well as orthostasis and vertigo -CT head with no acute finding -MRI brain ordered -call neurology if abnormal MRI  -orthostatic vitals -hold hctz/triamteren bp and Gala Murdoch -TED hose -Neurochecks per protocol -echo -ASA 325mg  now and continue asa 81mg   -N.p.o. until bedside swallow screen -PT/ OT   Elevated lipase She has no epigastric TTP, no N/V. Had diarrhea 3-4 days ago, but resolved Continue IVF, repeat lipase  Equivocal perpancreatic haziness on CT   Memory loss Recently seen by neurology  with further testing recommended  delirium precautions   Insomnia due to other mental disorder Hx of OSA, but not using cpap in past notes Continue trazodone prn   Anxiety and depression Has been forgetting morning meds and not taking consistently. Stopping paxil cold could be contributing to some of her symptoms Discussed importance of taking prescribed medication Discussed with daughter pill container to help with her memory deficit and taking medication in the AM     Advance Care Planning:   Code Status: Full Code   Consults: PT/OT   DVT Prophylaxis: SCDs   Family Communication: updated daughter by phone: : [phone]  Severity of Illness: The appropriate patient status for this patient is OBSERVATION. Observation status is judged to be reasonable and necessary in order to provide the required intensity of service to ensure the patient's safety. The patient's presenting symptoms, physical exam findings, and initial radiographic and laboratory data in the context of their medical condition is felt to place them at decreased risk for further clinical deterioration. Furthermore, it is anticipated that the patient will be medically stable for discharge from the hospital within 2 midnights of admission.   Author: Forrest Moron, MD 12/20/2021 4:41 PM  For on call review [email or web address].

## 2021-12-20 NOTE — ED Notes (Addendum)
Attempted to call report, RN unavailable.

## 2021-12-20 NOTE — ED Notes (Signed)
Client states her legs hurt "really bad" pt is diaphoretic, pale in color, has facial grimacing. ED MD to bedside. VS assessed

## 2021-12-20 NOTE — ED Notes (Signed)
Phone Handoff Report given to [employer] [facility] EMT-P)

## 2021-12-20 NOTE — Assessment & Plan Note (Signed)
She has no epigastric TTP, no N/V. Had diarrhea 3-4 days ago, but resolved Continue IVF, repeat lipase  Equivocal perpancreatic haziness on CT

## 2021-12-20 NOTE — Progress Notes (Signed)
Patient is having increased tremors of her upper body; causing telemetry rate to be high with artifact; denies chest pain or shortness of breath; she is sweaty presently and says she is hot; covers pulled back; informed MD; continue to monitor.

## 2021-12-21 ENCOUNTER — Observation Stay (HOSPITAL_COMMUNITY): Payer: Medicare HMO

## 2021-12-21 ENCOUNTER — Observation Stay (HOSPITAL_BASED_OUTPATIENT_CLINIC_OR_DEPARTMENT_OTHER): Payer: Medicare HMO

## 2021-12-21 DIAGNOSIS — Z90710 Acquired absence of both cervix and uterus: Secondary | ICD-10-CM | POA: Diagnosis not present

## 2021-12-21 DIAGNOSIS — G459 Transient cerebral ischemic attack, unspecified: Secondary | ICD-10-CM

## 2021-12-21 DIAGNOSIS — I10 Essential (primary) hypertension: Secondary | ICD-10-CM | POA: Diagnosis present

## 2021-12-21 DIAGNOSIS — M791 Myalgia, unspecified site: Secondary | ICD-10-CM | POA: Diagnosis present

## 2021-12-21 DIAGNOSIS — Z8673 Personal history of transient ischemic attack (TIA), and cerebral infarction without residual deficits: Secondary | ICD-10-CM | POA: Diagnosis not present

## 2021-12-21 DIAGNOSIS — R413 Other amnesia: Secondary | ICD-10-CM | POA: Diagnosis present

## 2021-12-21 DIAGNOSIS — G4733 Obstructive sleep apnea (adult) (pediatric): Secondary | ICD-10-CM | POA: Diagnosis present

## 2021-12-21 DIAGNOSIS — F5105 Insomnia due to other mental disorder: Secondary | ICD-10-CM | POA: Diagnosis present

## 2021-12-21 DIAGNOSIS — G20C Parkinsonism, unspecified: Secondary | ICD-10-CM | POA: Diagnosis present

## 2021-12-21 DIAGNOSIS — E119 Type 2 diabetes mellitus without complications: Secondary | ICD-10-CM | POA: Diagnosis present

## 2021-12-21 DIAGNOSIS — J449 Chronic obstructive pulmonary disease, unspecified: Secondary | ICD-10-CM | POA: Diagnosis present

## 2021-12-21 DIAGNOSIS — Z87891 Personal history of nicotine dependence: Secondary | ICD-10-CM | POA: Diagnosis not present

## 2021-12-21 DIAGNOSIS — Z888 Allergy status to other drugs, medicaments and biological substances status: Secondary | ICD-10-CM | POA: Diagnosis not present

## 2021-12-21 DIAGNOSIS — R42 Dizziness and giddiness: Secondary | ICD-10-CM | POA: Diagnosis present

## 2021-12-21 DIAGNOSIS — Z79899 Other long term (current) drug therapy: Secondary | ICD-10-CM | POA: Diagnosis not present

## 2021-12-21 DIAGNOSIS — F419 Anxiety disorder, unspecified: Secondary | ICD-10-CM | POA: Diagnosis present

## 2021-12-21 DIAGNOSIS — Z791 Long term (current) use of non-steroidal anti-inflammatories (NSAID): Secondary | ICD-10-CM | POA: Diagnosis not present

## 2021-12-21 DIAGNOSIS — M25551 Pain in right hip: Secondary | ICD-10-CM | POA: Diagnosis present

## 2021-12-21 DIAGNOSIS — E876 Hypokalemia: Secondary | ICD-10-CM | POA: Diagnosis present

## 2021-12-21 DIAGNOSIS — R748 Abnormal levels of other serum enzymes: Secondary | ICD-10-CM | POA: Diagnosis present

## 2021-12-21 DIAGNOSIS — F32A Depression, unspecified: Secondary | ICD-10-CM | POA: Diagnosis present

## 2021-12-21 LAB — CBC
HCT: 33.9 % — ABNORMAL LOW (ref 36.0–46.0)
Hemoglobin: 11.8 g/dL — ABNORMAL LOW (ref 12.0–15.0)
MCH: 32.6 pg (ref 26.0–34.0)
MCHC: 34.8 g/dL (ref 30.0–36.0)
MCV: 93.6 fL (ref 80.0–100.0)
Platelets: 159 10*3/uL (ref 150–400)
RBC: 3.62 MIL/uL — ABNORMAL LOW (ref 3.87–5.11)
RDW: 13.2 % (ref 11.5–15.5)
WBC: 4.8 10*3/uL (ref 4.0–10.5)
nRBC: 0 % (ref 0.0–0.2)

## 2021-12-21 LAB — ECHOCARDIOGRAM COMPLETE
Area-P 1/2: 4.15 cm2
Calc EF: 65.1 %
Height: 66 in
S' Lateral: 3 cm
Single Plane A2C EF: 59.3 %
Single Plane A4C EF: 68.1 %
Weight: 2892.44 oz

## 2021-12-21 LAB — BASIC METABOLIC PANEL
Anion gap: 10 (ref 5–15)
BUN: 8 mg/dL (ref 8–23)
CO2: 19 mmol/L — ABNORMAL LOW (ref 22–32)
Calcium: 8.4 mg/dL — ABNORMAL LOW (ref 8.9–10.3)
Chloride: 111 mmol/L (ref 98–111)
Creatinine, Ser: 0.69 mg/dL (ref 0.44–1.00)
GFR, Estimated: >60 mL/min (ref >60)
Glucose, Bld: 82 mg/dL (ref 70–99)
Potassium: 3.2 mmol/L — ABNORMAL LOW (ref 3.5–5.1)
Sodium: 140 mmol/L (ref 135–145)

## 2021-12-21 LAB — LIPID PANEL
Cholesterol: 112 mg/dL (ref 0–200)
HDL: 53 mg/dL (ref >40)
LDL Cholesterol: 53 mg/dL (ref 0–99)
Total CHOL/HDL Ratio: 2.1 RATIO
Triglycerides: 30 mg/dL (ref <150)
VLDL: 6 mg/dL (ref 0–40)

## 2021-12-21 MED ORDER — MECLIZINE HCL 12.5 MG PO TABS: 12.5000 mg | ORAL_TABLET | Freq: Three times a day (TID) | ORAL | Status: DC | PRN

## 2021-12-21 MED ORDER — POTASSIUM CHLORIDE CRYS ER 20 MEQ PO TBCR
40.0000 meq | EXTENDED_RELEASE_TABLET | Freq: Once | ORAL | Status: AC
  Administered 2021-12-21: 40 meq via ORAL
  Filled 2021-12-21: qty 2

## 2021-12-21 MED ORDER — PERFLUTREN LIPID MICROSPHERE
1.0000 mL | INTRAVENOUS | Status: AC | PRN
  Administered 2021-12-21: 3 mL via INTRAVENOUS
  Filled 2021-12-21: qty 10

## 2021-12-21 MED ORDER — TRIAMTERENE-HCTZ 37.5-25 MG PO TABS
0.5000 | ORAL_TABLET | Freq: Every day | ORAL | Status: DC
  Administered 2021-12-21 – 2021-12-27 (×7): 0.5 via ORAL
  Filled 2021-12-21 (×7): qty 0.5

## 2021-12-21 NOTE — NC FL2 (Signed)
[place] MEDICAID FL2 LEVEL OF CARE SCREENING TOOL     IDENTIFICATION  Patient Name: Candice Velasquez Birthdate: Feb 03, 1951 Sex: female Admission Date (Current Location): 12/19/2021  [facility] and [place] Number:  Producer, television/film/video and Address:  [facility], [address] [place], [place] [address]      Provider Number: [id number]  Attending Physician Name and Address:  Hughie Closs, MD  Relative Name and Phone Number:       Current Level of Care: Hospital Recommended Level of Care: Skilled Nursing Facility Prior Approval Number:    Date Approved/Denied:   PASRR Number: [id number]  Discharge Plan: SNF    Current Diagnoses: Patient Active Problem List   Diagnosis Date Noted   Elevated lipase 12/20/2021   Memory loss 12/20/2021   Dizziness 12/19/2021   Drug induced akathisia 01/29/2020   Drug-induced tremor 01/29/2020   Sprain of right rotator cuff capsule 01/29/2020   Gait instability 01/29/2020   Tobacco abuse disorder 12/25/2019   Anxiety and depression 11/14/2019   Inadequate sleep hygiene 11/14/2019   COPD not affecting current episode of care 11/14/2019   History of sleep apnea 11/14/2019   Insomnia due to other mental disorder 11/14/2019   Nocturia more than twice per night 11/14/2019   GAD (generalized anxiety disorder) 12/28/2017   Accidental fall 02/07/2017    Orientation RESPIRATION BLADDER Height & Weight     Self, Time  Normal Incontinent Weight: 180 lb 12.4 oz (82 kg) Height:  5\' 6"  (167.6 cm)  BEHAVIORAL SYMPTOMS/MOOD NEUROLOGICAL BOWEL NUTRITION STATUS      Continent Diet (heart healthy)  AMBULATORY STATUS COMMUNICATION OF NEEDS Skin   Extensive Assist Verbally Normal                       Personal Care Assistance Level of Assistance  Bathing, Feeding, Dressing Bathing Assistance: Maximum assistance Feeding assistance: Limited assistance Dressing Assistance: Maximum assistance     Functional  Limitations Info  Sight Sight Info: Impaired        SPECIAL CARE FACTORS FREQUENCY  PT (By licensed PT), OT (By licensed OT)     PT Frequency: 5x/wk OT Frequency: 5x/wk            Contractures Contractures Info: Not present    Additional Factors Info  Code Status, Allergies, Psychotropic Code Status Info: Full Allergies Info: Lunesta (Eszopiclone), Voltaren (Diclofenac) Psychotropic Info: Paxil 30mg  daily         Current Medications (12/21/2021):  This is the current hospital active medication list Current Facility-Administered Medications  Medication Dose Route Frequency Provider Last Rate Last Admin   0.9 %  sodium chloride infusion  1,000 mL Intravenous Continuous , MD 100 mL/hr at 12/21/21 0647 1,000 mL at 12/21/21 0647   aspirin EC tablet 81 mg  81 mg Oral Daily 02/20/22, MD   81 mg at 12/21/21 [facility]   meclizine (ANTIVERT) tablet 12.5 mg  12.5 mg Oral TID PRN 02/20/22, MD       PARoxetine (PAXIL) tablet 30 mg  30 mg Oral Daily 8527, MD   30 mg at 12/21/21 1002   traZODone (DESYREL) tablet 50 mg  50 mg Oral QHS [facility], MD   50 mg at 12/20/21 2024   triamterene-hydrochlorothiazide (MAXZIDE-25) 37.5-25 MG per tablet 0.5 tablet  0.5 tablet Oral Daily 2025, MD   0.5 tablet at 12/21/21 1346     Discharge Medications: Please see discharge  summary for a list of discharge medications.  Relevant Imaging Results:  Relevant Lab Results:   Additional Information SS#: [id number]  Geralynn Ochs, LCSW

## 2021-12-21 NOTE — Progress Notes (Signed)
  Echocardiogram 2D Echocardiogram has been performed.  Candice Velasquez 12/21/2021, 10:01 AM

## 2021-12-21 NOTE — Progress Notes (Signed)
PROGRESS NOTE    Candice Velasquez  HDQ:[id number] DOB: January 03, 1951 DOA: 12/19/2021 PCP: Merri Brunette, MD   Brief Narrative:  HPI: Candice Velasquez is a 71 y.o. female with medical history significant of anxiety and depression, HTN, dizziness/vertigo, hx of cerebellar lacunar infarct, memory impairment who presented to ED with complaints of dizziness and weakness.  When I ask her what prompted her to go to ED, she says, " I dont' know why Im here and don't know."  Per ED note she came in for right hip pain and complained of worsening dizziness. She does admit that it's been worse than normal when I tell her this. She states it has been worse than normal for about one months time. She stopped vestibular therapy. She states she has not been, sick but the food at her [place] is not good and she thinks it makes her sick. She denies any focal deficits or slurred speech. She states she really only gets dizzy with postural changes and only lasts a few seconds. Goes away if she stays still. No ringing or hearing loss in ears.    Per her daughter they thought her tremors were worse than normal when she saw her yesterday AM. She was very sweaty and ? If she had a panic attack. She told her daughter she woke up 2AM with pain in both of her hips. Due to hip pain, worsening tremor and dizziness they took her to ED. Her daughter states he was able to walk in to ED fine on her own.    Seen by neurology in 10/2021 with complaints of dizziness at that time. Suspected largely vestibular involvement and undergoing vestibular rehab.    History of tremors.neurology note: 11/21:  Negative EMG studies. Doubted primary PD. ? Medication induced akathesia and tremors.     Denies any fever/chills, vision changes/headaches, chest pain or palpitations, shortness of breath or cough, abdominal pain, N/V, dysuria or leg swelling. She had some episodes of diarrhea 3-4 days ago, but none since that time.      She does not smoke  or drink alcohol daily. Socially drinks.    ER Course:  vitals: afebrile, bp: 139/95, HR: 94, RR: 22, oxygen: 99%RA\ Pertinent labs: lipase 67 CT head: no acute finding. 2. Generalized parenchymal volume loss and chronic small-vessel ischemic changes, similar to most recent prior exam. Chronic left cerebellar infarct. CT renal study: 1. Punctate nonobstructing upper pole right renal calculus. No signs of hydronephrosis, hydroureter or ureteral calculi. 2. Equivocal peripancreatic haziness identified. Correlate for any clinical signs or symptoms of acute pancreatitis. 3. Small hiatal hernia. Right hip xray: negative In ED: given toradol, morphine, oxycodone, NS 1L bolus and maintenance IVF. TRH asked to admit.     Assessment & Plan:   Principal Problem:   Dizziness Active Problems:   Elevated lipase   Anxiety and depression   Insomnia due to other mental disorder   Memory loss  Dizziness: CT head negative.  MRI brain pending.  Will consult neurology if any positive findings on MRI.  Holding BP meds.  Echo pending.  Continue aspirin.  PT is also consulted for possible vestibular therapies.  We will try meclizine as needed as well.   Elevated lipase: Nonspecific and insignificant.  Resolved.   Memory loss Recently seen by neurology with further testing recommended  delirium precautions    Insomnia due to other mental disorder Hx of OSA, but not using cpap in past notes Continue trazodone prn   Essential  hypertension: Resume triamterene and hydrochlorothiazide.   Anxiety and depression Has been forgetting morning meds and not taking consistently. Stopping paxil cold [place] could be contributing to some of her symptoms Discussed importance of taking prescribed medication Discussed with daughter pill container to help with her memory deficit and taking medication in the AM   Hypokalemia: Replace.  DVT prophylaxis: Place and maintain sequential compression device Start: 12/20/21  1541 Place TED hose Start: 12/20/21 1523   Code Status: Full Code  Family Communication:  None present at bedside.   Status is: Observation The patient will require care spanning > 2 midnights and should be moved to inpatient because: Patient is still very dizzy.   Estimated body mass index is 29.18 kg/m as calculated from the following:   Height as of this encounter: 5\' 6"  (1.676 m).   Weight as of this encounter: 82 kg.    Nutritional Assessment: Body mass index is 29.18 kg/m. Seen by dietician.  I agree with the assessment and plan as outlined below: Nutrition Status:        . Skin Assessment: I have examined the patient's skin and I agree with the wound assessment as performed by the wound care RN as outlined below:    Consultants:  None   Procedures:  None  Antimicrobials:  Anti-infectives (From admission, onward)    None         Subjective: Patient seen and examined.  Still complains of dizziness even when she is laying in the bed.  She has no other complaint.  She is fully alert and oriented however I question her capacity to make decisions.  Patient continues to talk about a musical show that is scheduled to happen at her independent living facility today.  She really wants to get there at 6 PM and does not want to miss it.  She keeps repeating the same thing over and over again.  Objective: Vitals:   12/20/21 2010 12/21/21 0007 12/21/21 0406 12/21/21 0723  BP: (!) 164/96 (!) 146/94 (!) 146/91 (!) 147/77  Pulse: 91 93 84 75  Resp: 16 17 19 16   Temp: 98.2 F (36.8 C) 97.8 F (36.6 C) 98.6 F (37 C) 98.3 F (36.8 C)  TempSrc: Oral Oral Oral Oral  SpO2: 95% 98% 98% 97%  Weight:      Height:        Intake/Output Summary (Last 24 hours) at 12/21/2021 0750 Last data filed at 12/21/2021 0400 Gross per 24 hour  Intake 1089.78 ml  Output 250 ml  Net 839.78 ml   Filed Weights   12/19/21 0701 12/20/21 1743  Weight: 81.6 kg 82 kg     Examination:  General exam: Appears calm and comfortable  Respiratory system: Clear to auscultation. Respiratory effort normal. Cardiovascular system: S1 & S2 heard, RRR. No JVD, murmurs, rubs, gallops or clicks. No pedal edema. Gastrointestinal system: Abdomen is nondistended, soft and nontender. No organomegaly or masses felt. Normal bowel sounds heard. Central nervous system: Alert and oriented. No focal neurological deficits. Extremities: Symmetric 5 x 5 power. Skin: No rashes, lesions or ulcers Psychiatry: Judgement and insight appear poor  Data Reviewed: I have personally reviewed following labs and imaging studies  CBC: Recent Labs  Lab 12/19/21 0715 12/21/21 0432  WBC 7.7 4.8  HGB 14.4 11.8*  HCT 41.6 33.9*  MCV 93.3 93.6  PLT 236 159   Basic Metabolic Panel: Recent Labs  Lab 12/19/21 0715 12/21/21 0432  NA 142 140  K 3.8 3.2*  CL 109 111  CO2 24 19*  GLUCOSE 131* 82  BUN 11 8  CREATININE 0.87 0.69  CALCIUM 9.3 8.4*   GFR: Estimated Creatinine Clearance: 71.7 mL/min (by C-G formula based on SCr of 0.69 mg/dL). Liver Function Tests: Recent Labs  Lab 12/19/21 0715  AST 19  ALT 15  ALKPHOS 52  BILITOT 0.8  PROT 7.8  ALBUMIN 4.6   Recent Labs  Lab 12/19/21 1022 12/20/21 1543  LIPASE 67* 26   No results for input(s): "AMMONIA" in the last 168 hours. Coagulation Profile: No results for input(s): "INR", "PROTIME" in the last 168 hours. Cardiac Enzymes: No results for input(s): "CKTOTAL", "CKMB", "CKMBINDEX", "TROPONINI" in the last 168 hours. BNP (last 3 results) No results for input(s): "PROBNP" in the last 8760 hours. HbA1C: Recent Labs    12/20/21 1543  HGBA1C 5.2   CBG: Recent Labs  Lab 12/19/21 0654  GLUCAP 132*   Lipid Profile: Recent Labs    12/21/21 0432  CHOL 112  HDL 53  LDLCALC 53  TRIG 30  CHOLHDL 2.1   Thyroid Function Tests: Recent Labs    12/20/21 1543  TSH 1.089   Anemia Panel: No results for input(s):  "VITAMINB12", "FOLATE", "FERRITIN", "TIBC", "IRON", "RETICCTPCT" in the last 72 hours. Sepsis Labs: No results for input(s): "PROCALCITON", "LATICACIDVEN" in the last 168 hours.  No results found for this or any previous visit (from the past 240 hour(s)).   Radiology Studies: CT Head Wo Contrast  Result Date: 12/19/2021 CLINICAL DATA:  Dizziness for weeks.  Tremor. EXAM: CT HEAD WITHOUT CONTRAST TECHNIQUE: Contiguous axial images were obtained from the base of the skull through the vertex without intravenous contrast. RADIATION DOSE REDUCTION: This exam was performed according to the departmental dose-optimization program which includes automated exposure control, adjustment of the mA and/or kV according to patient size and/or use of iterative reconstruction technique. COMPARISON:  CT head 06/05/2021 FINDINGS: Brain: No evidence of acute infarction, hemorrhage, hydrocephalus, extra-axial collection or mass lesion/mass effect. Generalized parenchymal volume loss, similar to prior exam. Scattered hypodensities in the periventricular and subcortical white matter, consistent with chronic small-vessel ischemic changes. Chronic left cerebellar infarct. Vascular: No hyperdense vessel or unexpected calcification. Skull: Normal. Negative for fracture or focal lesion. Sinuses/Orbits: No acute finding. The visualized paranasal sinuses and mastoid air cells are clear. Other: None. IMPRESSION: 1. No acute intracranial abnormality. 2. Generalized parenchymal volume loss and chronic small-vessel ischemic changes, similar to most recent prior exam. Chronic left cerebellar infarct. Electronically Signed   By: Ileana Roup M.D.   On: 12/19/2021 12:33   CT Renal Stone Study  Result Date: 12/19/2021 CLINICAL DATA:  Flank pain.  Kidney stone. EXAM: CT ABDOMEN AND PELVIS WITHOUT CONTRAST TECHNIQUE: Multidetector CT imaging of the abdomen and pelvis was performed following the standard protocol without IV contrast. RADIATION  DOSE REDUCTION: This exam was performed according to the departmental dose-optimization program which includes automated exposure control, adjustment of the mA and/or kV according to patient size and/or use of iterative reconstruction technique. COMPARISON:  None Available. FINDINGS: Lower chest: No acute abnormality. Hepatobiliary: No focal liver abnormality is seen. No gallstones, gallbladder wall thickening, or biliary dilatation. Pancreas: Equivocal peripancreatic haziness identified. No definite pancreatic edema. No signs of main duct dilatation or mass. Spleen: Normal in size without focal abnormality. Adrenals/Urinary Tract: Normal adrenal glands. Punctate stone within upper pole of right kidney noted, image 38/4. No left renal calculi. No kidney mass or hydronephrosis identified. No hydroureter identified. Urinary bladder  is unremarkable. Stomach/Bowel: Small hiatal hernia. The appendix is visualized and appears normal. There is no bowel wall thickening, inflammation, or distension. Vascular/Lymphatic: No significant vascular findings are present. No enlarged abdominal or pelvic lymph nodes. Reproductive: Status post hysterectomy. No adnexal masses. Other: No free fluid or fluid collections. No signs of pneumoperitoneum. Musculoskeletal: No acute abnormality. Thoracolumbar spondylosis noted. IMPRESSION: 1. Punctate nonobstructing upper pole right renal calculus. No signs of hydronephrosis, hydroureter or ureteral calculi. 2. Equivocal peripancreatic haziness identified. Correlate for any clinical signs or symptoms of acute pancreatitis. 3. Small hiatal hernia. Electronically Signed   By: Signa Kell M.D.   On: 12/19/2021 09:20   DG Hip Unilat W or Wo Pelvis 2-3 Views Right  Result Date: 12/19/2021 CLINICAL DATA:  Right hip pain for awhile EXAM: DG HIP (WITH OR WITHOUT PELVIS) 2-3V RIGHT COMPARISON:  None Available. FINDINGS: No evidence of fracture or bone lesion. Mild degenerative spurring  symmetrically the hips. Osteopenia and lower lumbar spine degeneration. IMPRESSION: 1. No acute or focal finding. 2. Symmetric, mild hip degeneration. Electronically Signed   By: Tiburcio Pea M.D.   On: 12/19/2021 08:00    Scheduled Meds:   stroke: early stages of recovery book   Does not apply Once   aspirin EC  81 mg Oral Daily   PARoxetine  30 mg Oral Daily   potassium chloride  40 mEq Oral Once   traZODone  50 mg Oral QHS   Continuous Infusions:  sodium chloride 1,000 mL (12/21/21 0647)     LOS: 0 days   Hughie Closs, MD [facility]  12/21/2021, 7:50 AM   *Please note that this is a verbal dictation therefore any spelling or grammatical errors are due to the "Dragon Medical One" system interpretation.  Please page via Amion and do not message via secure chat for urgent patient care matters. Secure chat can be used for non urgent patient care matters.  How to contact the [facility] Attending or Consulting provider 7A - 7P or covering provider during after hours 7P -7A, for this patient?  Check the care team in [facility] and look for a) attending/consulting TRH provider listed and b) the [facility] team listed. Page or secure chat 7A-7P. Log into [email or web address] and use [place]'s universal password to access. If you do not have the password, please contact the hospital operator. Locate the [facility] provider you are looking for under [facility] and page to a number that you can be directly reached. If you still have difficulty reaching the provider, please page the [facility] (Director on Call) for the Hospitalists listed on amion for assistance.

## 2021-12-21 NOTE — TOC Initial Note (Signed)
Transition of Care [facility]) - Initial/Assessment Note    Patient Details  Name: Candice Velasquez MRN: [id number] Date of Birth: Jul 04, 1950  Transition of Care [facility]) CM/SW Contact:    Geralynn Ochs, LCSW Phone Number: 12/21/2021, 3:02 PM  Clinical Narrative:       CSW spoke with daughter, [name], to discuss recommendation for SNF placement. Jessica in agreement, preference for somewhere close to [facility] as that is close to where she is located. CSW faxed out referral, will follow up with bed offers.             Expected Discharge Plan: Skilled Nursing Facility Barriers to Discharge: Continued Medical Work up, Ship broker   Patient Goals and CMS Choice Patient states their goals for this hospitalization and ongoing recovery are:: patient unable to participate in goal setting, not fully oriented CMS Medicare.gov Compare Post Acute Care list provided to:: Patient Represenative (must comment) Choice offered to / list presented to : Adult Children  Expected Discharge Plan and Services Expected Discharge Plan: [place] Acute Care Choice: [place] arrangements for the past 2 months: [place]                                      Prior Living Arrangements/Services Living arrangements for the past 2 months: [place] Lives with:: Self Patient language and need for interpreter reviewed:: No Do you feel safe going back to the place where you live?: Yes      Need for Family Participation in Patient Care: Yes (Comment) Care giver support system in place?: No (comment)   Criminal Activity/Legal Involvement Pertinent to Current Situation/Hospitalization: No - Comment as needed  Activities of Daily Living Home Assistive Devices/Equipment: Walker (specify type) ADL Screening (condition at time of admission) Patient's cognitive ability adequate to safely complete daily activities?:  No Is the patient deaf or have difficulty hearing?: No Does the patient have difficulty seeing, even when wearing glasses/contacts?: No Does the patient have difficulty concentrating, remembering, or making decisions?: Yes Patient able to express need for assistance with ADLs?: Yes Does the patient have difficulty dressing or bathing?: Yes Independently performs ADLs?: No Communication: Independent Dressing (OT): Needs assistance Is this a change from baseline?: Change from baseline, expected to last >3 days Grooming: Needs assistance Is this a change from baseline?: Change from baseline, expected to last >3 days Feeding: Independent Bathing: Needs assistance Is this a change from baseline?: Change from baseline, expected to last >3 days Toileting: Needs assistance Is this a change from baseline?: Change from baseline, expected to last >3days In/Out Bed: Needs assistance Is this a change from baseline?: Change from baseline, expected to last >3 days Walks in Home: Needs assistance Is this a change from baseline?: Change from baseline, expected to last >3 days Does the patient have difficulty walking or climbing stairs?: Yes Weakness of Legs: Both Weakness of Arms/Hands: Both  Permission Sought/Granted Permission sought to share information with : Facility Sport and exercise psychologist, Family Supports Permission granted to share information with : Yes, Verbal Permission Granted  Share Information with NAME: [name]  Permission granted to share info w AGENCY: SNF  Permission granted to share info w Relationship: Daughter     Emotional Assessment   Attitude/Demeanor/Rapport: Unable to Assess Affect (typically observed): Unable to Assess Orientation: : Oriented to Self, Oriented to  Time Alcohol /  Substance Use: Not Applicable Psych Involvement: No (comment)  Admission diagnosis:  Dizziness [R42] Ataxia [R27.0] Pain of right hip [M25.551] Parkinsonism, unspecified Parkinsonism type  [G20.C] Patient Active Problem List   Diagnosis Date Noted   Elevated lipase 12/20/2021   Memory loss 12/20/2021   Dizziness 12/19/2021   Drug induced akathisia 01/29/2020   Drug-induced tremor 01/29/2020   Sprain of right rotator cuff capsule 01/29/2020   Gait instability 01/29/2020   Tobacco abuse disorder 12/25/2019   Anxiety and depression 11/14/2019   Inadequate sleep hygiene 11/14/2019   COPD not affecting current episode of care 11/14/2019   History of sleep apnea 11/14/2019   Insomnia due to other mental disorder 11/14/2019   Nocturia more than twice per night 11/14/2019   GAD (generalized anxiety disorder) 12/28/2017   Accidental fall 02/07/2017   PCP:  Merri Brunette, MD Pharmacy:   [facility] [address], [place] - [address] [place] [address] Phone: [phone] Fax: [phone]  [facility] [id number] - [address], [place] - [address] [place] [address] Phone: [phone] Fax: [phone]  [facility] [address] [place], [place] - [address] [place] [address] Phone: [phone] Fax: [phone]  [facility] [place], [place] - [address] [place] [address] Phone: [phone] Fax: [phone]     Social Determinants of Health (SDOH) Interventions Housing Interventions: Intervention Not Indicated, Patient Refused  Readmission Risk Interventions     No data to display

## 2021-12-21 NOTE — Evaluation (Signed)
Occupational Therapy Evaluation Patient Details Name: Candice Velasquez MRN: [id number] DOB: [birth year] Today's Date: 12/21/2021   History of Present Illness Pt is a 71 y/o female presenting on 12/19/21 with dizziness and generalized weakness. PMH includes: anxiety, depression, HTN, dizziness/vertigo (was seeing vestibular PT), CVA (cerebellar lacunar infarct), memory impairment.   Clinical Impression   PTA patient reports independent using rollator for mobility, ADLs; living in ILF and having assist for IADLs (meals in dining area, cleaner 1x/week).  Patient limited by problem list below, including generalized weakness, impaired balance, decreaed activity tolerance, pain in BLEs and impaired cognition.  Pt is disoriented to situation, follows simple commands with increased time but demonstrates poor safety awareness, problem solving, and recall. Pt completing transfers with mod assist +2, min assist for UB ADLs and max assist +2 for LB Adls.  Believe she will benefit from continued OT services acutely and after dc at SNF level to optimize safety, independence with ADLs and mobility prior to return back to ILF.      Recommendations for follow up therapy are one component of a multi-disciplinary discharge planning process, led by the attending physician.  Recommendations may be updated based on patient status, additional functional criteria and insurance authorization.   Follow Up Recommendations  Skilled nursing-short term rehab (<3 hours/day)    Assistance Recommended at Discharge Frequent or constant Supervision/Assistance  Patient can return home with the following Assist for transportation;Help with stairs or ramp for entrance;Direct supervision/assist for financial management;Direct supervision/assist for medications management;Assistance with cooking/housework;A lot of help with bathing/dressing/bathroom;Two people to help with walking and/or transfers    Functional Status Assessment   Patient has had a recent decline in their functional status and demonstrates the ability to make significant improvements in function in a reasonable and predictable amount of time.  Equipment Recommendations  Other (comment) (defer)    Recommendations for Other Services       Precautions / Restrictions Precautions Precautions: Fall Restrictions Weight Bearing Restrictions: No      Mobility Bed Mobility Overal bed mobility: Needs Assistance Bed Mobility: Rolling, Sidelying to Sit Rolling: Min assist Sidelying to sit: Mod assist       General bed mobility comments: Pt reaching out for therapist's hand to transition to sidelying. Assist provided for LE's off EOB and for trunk elevation to full sitting position. Increased time to scoot out to get feet on the floor.    Transfers Overall transfer level: Needs assistance Equipment used: Rolling walker (2 wheels) Transfers: Sit to/from Stand, Bed to chair/wheelchair/BSC Sit to Stand: Mod assist, +2 physical assistance     Step pivot transfers: Mod assist, +2 physical assistance     General transfer comment: Heavy +2 mod assist to power up to full stand. Increased time to gain/maintain standing balance and to take pivotal steps around to the chair. Heavy posterior lean.      Balance Overall balance assessment: Needs assistance Sitting-balance support: Single extremity supported Sitting balance-Leahy Scale: Poor Sitting balance - Comments: Difficulty holding midline balance without UE support   Standing balance support: During functional activity, Bilateral upper extremity supported, Reliant on assistive device for balance Standing balance-Leahy Scale: Poor Standing balance comment: Reliant on UE support and +2 assist, posterior lean preference                           ADL either performed or assessed with clinical judgement   ADL Overall ADL's : Needs assistance/impaired  Grooming: Sitting;Minimal  assistance Grooming Details (indicate cue type and reason): washing face setup, anticipate min assist for oral care         Upper Body Dressing : Minimal assistance;Sitting   Lower Body Dressing: Maximal assistance;+2 for physical assistance;+2 for safety/equipment;Sit to/from stand   Toilet Transfer: Moderate assistance;+2 for physical assistance;+2 for safety/equipment;Rolling walker (2 wheels);Stand-pivot           Functional mobility during ADLs: Moderate assistance;+2 for physical assistance;+2 for safety/equipment;Cueing for sequencing;Rolling walker (2 wheels);Cueing for safety       Vision Baseline Vision/History: 1 Wears glasses Ability to See in Adequate Light: 0 Adequate Patient Visual Report: Other (comment) (R side looks "bad") Vision Assessment?: Yes Eye Alignment: Within Functional Limits Ocular Range of Motion: Within Functional Limits Alignment/Gaze Preference: Within Defined Limits Tracking/Visual Pursuits: Able to track stimulus in all quads without difficulty Saccades: Within functional limits Visual Fields: No apparent deficits Additional Comments: vision appears [facility], pt reports "bumping into things' on her R side and difficulty seeing out of R side.  Periphreal vision testing with mild decreased on R compared to L, - approx 35* but otherise appears normal.  Continue to assess     Perception     Praxis      Pertinent Vitals/Pain Pain Assessment Pain Assessment: Faces Faces Pain Scale: Hurts even more Pain Location: BLE's in knee and quads, R>L Pain Descriptors / Indicators: Sore, Tightness Pain Intervention(s): Limited activity within patient's tolerance, Monitored during session, Repositioned     Hand Dominance Left   Extremity/Trunk Assessment Upper Extremity Assessment Upper Extremity Assessment: Generalized weakness (BUE tremors noted)   Lower Extremity Assessment Lower Extremity Assessment: Defer to PT evaluation RLE Deficits / Details:  Pt reports pain in her knee (both medially and laterally) and into her quads in sitting. Pt reports pain actively in extending knee and with passive extension. She reports pain with hamstring MMT as well. Overall strength appeared to be 4/5. Decreased ROM noted in ankle - unclear whether pt was resisting movement or if range was that limited. RLE Sensation: WNL LLE Deficits / Details: Pt has the same complaints as the RLE however states the L is less painful than the R. LLE Sensation: WNL   Cervical / Trunk Assessment Cervical / Trunk Assessment: Other exceptions Cervical / Trunk Exceptions: Forward head posture with rounded shoulders   Communication Communication Communication: No difficulties   Cognition Arousal/Alertness: Awake/alert Behavior During Therapy: WFL for tasks assessed/performed Overall Cognitive Status: Impaired/Different from baseline Area of Impairment: Safety/judgement, Awareness, Problem solving, Following commands, Orientation, Memory, Attention                 Orientation Level: Disoriented to, Situation Current Attention Level: Sustained Memory: Decreased short-term memory Following Commands: Follows one step commands consistently, Follows one step commands with increased time, Follows multi-step commands inconsistently Safety/Judgement: Decreased awareness of safety, Decreased awareness of deficits Awareness: Emergent Problem Solving: Slow processing, Difficulty sequencing, Requires verbal cues, Decreased initiation General Comments: pt disoriented to situation, she follows commands with increased time but poor awareness of safety, deficits and poor carryover of techniques     General Comments       Exercises     Shoulder Instructions      Home Living Family/patient expects to be discharged to:: Private residence Living Arrangements: Alone   Type of Home: Independent living facility Home Access: Elevator     Home Layout: One level      Bathroom Shower/Tub: Gaffer  Bathroom Toilet: Handicapped height     Home Equipment: Rollator (4 wheels);Grab bars - toilet;Grab bars - tub/shower;Shower Counsellor (2 wheels);Cane - single point          Prior Functioning/Environment Prior Level of Function : Independent/Modified Independent             Mobility Comments: uses rollator for mobility ADLs Comments: independent ADLs and med mgmt, has assist for cleaning 1x/week and goes to dining area for meals        OT Problem List: Decreased strength;Decreased activity tolerance;Decreased cognition;Decreased safety awareness;Decreased knowledge of use of DME or AE;Decreased knowledge of precautions;Impaired balance (sitting and/or standing);Decreased coordination;Impaired UE functional use      OT Treatment/Interventions: Self-care/ADL training;DME and/or AE instruction;Therapeutic exercise;Therapeutic activities;Patient/family education;Balance training;Cognitive remediation/compensation;Visual/perceptual remediation/compensation    OT Goals(Current goals can be found in the care plan section) Acute Rehab OT Goals Patient Stated Goal: get better OT Goal Formulation: With patient Time For Goal Achievement: 01/04/22 Potential to Achieve Goals: Good ADL Goals Pt Will Perform Grooming: with supervision;sitting;with set-up Pt Will Perform Upper Body Dressing: with set-up;sitting Pt Will Perform Lower Body Dressing: with min assist;sit to/from stand;with adaptive equipment Pt Will Transfer to Toilet: with min assist;ambulating;bedside commode;stand pivot transfer Additional ADL Goal #1: Pt will complete bed mobility with min assist and maintain dynamic balance at EOB for 5 minutes as precursor to ADLs.  OT Frequency: Min 2X/week    Co-evaluation PT/OT/SLP Co-Evaluation/Treatment: Yes Reason for Co-Treatment: Complexity of the patient's impairments (multi-system involvement) PT goals addressed during  session: Mobility/safety with mobility;Balance;Proper use of DME;Strengthening/ROM        AM-PAC OT "6 Clicks" Daily Activity     Outcome Measure Help from another person eating meals?: A Little Help from another person taking care of personal grooming?: A Little Help from another person toileting, which includes using toliet, bedpan, or urinal?: A Lot Help from another person bathing (including washing, rinsing, drying)?: A Lot Help from another person to put on and taking off regular upper body clothing?: A Little Help from another person to put on and taking off regular lower body clothing?: A Lot 6 Click Score: 15   End of Session Equipment Utilized During Treatment: Rolling walker (2 wheels);Gait belt Nurse Communication: Mobility status  Activity Tolerance: Patient tolerated treatment well Patient left: in chair;with call bell/phone within reach;with chair alarm set  OT Visit Diagnosis: Other abnormalities of gait and mobility (R26.89);Muscle weakness (generalized) (M62.81);Other symptoms and signs involving cognitive function                Time: 0822-0852 OT Time Calculation (min): 30 min Charges:  OT General Charges $OT Visit: 1 Visit OT Evaluation $OT Eval Moderate Complexity: 1 Mod  Barry Brunner, OT Acute Rehabilitation Services Office [phone]   Chancy Milroy 12/21/2021, 1:41 PM

## 2021-12-21 NOTE — Evaluation (Signed)
Physical Therapy Evaluation  Patient Details Name: Candice Velasquez MRN: [id number] DOB: Feb 03, 1951 Today's Date: 12/21/2021  History of Present Illness  Pt is a 71 y/o female presenting on 12/19/21 with dizziness and generalized weakness. PMH includes: anxiety, depression, HTN, dizziness/vertigo (was seeing vestibular PT), CVA (cerebellar lacunar infarct), memory impairment.  Clinical Impression  Pt admitted with above diagnosis. Pt currently with functional limitations due to the deficits listed below (see PT Problem List). At the time of PT eval pt was able to perform transfers with up to +2 mod assist and RW for support. Pt with heavy posterior lean and difficulty advancing LE's, complaining of pain in both knees and into quads. No knee buckling noted throughout OOB mobility, however. Recommend SNF level rehab to maximize functional independence, safety, and tolerance for functional activity to prepare her for return to independent living facility.      Recommendations for follow up therapy are one component of a multi-disciplinary discharge planning process, led by the attending physician.  Recommendations may be updated based on patient status, additional functional criteria and insurance authorization.  Follow Up Recommendations Skilled nursing-short term rehab (<3 hours/day) Can patient physically be transported by private vehicle: No    Assistance Recommended at Discharge Frequent or constant Supervision/Assistance  Patient can return home with the following  Two people to help with walking and/or transfers;Assistance with cooking/housework;Assist for transportation;Help with stairs or ramp for entrance    Equipment Recommendations Other (comment) (TBD by next venue of care)  Recommendations for Other Services       Functional Status Assessment Patient has had a recent decline in their functional status and demonstrates the ability to make significant improvements in function  in a reasonable and predictable amount of time.     Precautions / Restrictions Precautions Precautions: Fall Restrictions Weight Bearing Restrictions: No      Mobility  Bed Mobility Overal bed mobility: Needs Assistance Bed Mobility: Rolling, Sidelying to Sit Rolling: Min assist Sidelying to sit: Mod assist       General bed mobility comments: Pt reaching out for therapist's hand to transition to sidelying. Assist provided for LE's off EOB and for trunk elevation to full sitting position. Increased time to scoot out to get feet on the floor.    Transfers Overall transfer level: Needs assistance Equipment used: Rolling walker (2 wheels) Transfers: Sit to/from Stand, Bed to chair/wheelchair/BSC Sit to Stand: Mod assist, +2 physical assistance   Step pivot transfers: Mod assist, +2 physical assistance       General transfer comment: Heavy +2 mod assist to power up to full stand. Increased time to gain/maintain standing balance and to take pivotal steps around to the chair. Heavy posterior lean.    Ambulation/Gait               General Gait Details: Unable to progress to gait training at this time  Stairs            Wheelchair Mobility    Modified Rankin (Stroke Patients Only)       Balance Overall balance assessment: Needs assistance Sitting-balance support: Single extremity supported Sitting balance-Leahy Scale: Poor Sitting balance - Comments: Difficulty holding midline balance without UE support   Standing balance support: During functional activity, Bilateral upper extremity supported, Reliant on assistive device for balance Standing balance-Leahy Scale: Poor Standing balance comment: Reliant on UE support and +2 assist  Pertinent Vitals/Pain Pain Assessment Pain Assessment: Faces Faces Pain Scale: Hurts even more Pain Location: BLE's in knee and quads, R>L Pain Descriptors / Indicators: Sore,  Tightness Pain Intervention(s): Limited activity within patient's tolerance, Monitored during session, Repositioned    Home Living Family/patient expects to be discharged to:: Private residence Living Arrangements: Alone   Type of Home: Independent living facility Home Access: [place]: One level Home Equipment: Rollator (4 wheels);Grab bars - toilet;Grab bars - tub/shower;Shower Land (2 wheels);Cane - single point      Prior Function Prior Level of Function : Independent/Modified Independent             Mobility Comments: uses rollator for mobility ADLs Comments: independent ADLs and med mgmt, has assist for cleaning 1x/week and goes to dining area for meals     Hand Dominance        Extremity/Trunk Assessment   Upper Extremity Assessment Upper Extremity Assessment: Defer to OT evaluation    Lower Extremity Assessment Lower Extremity Assessment: RLE deficits/detail;LLE deficits/detail RLE Deficits / Details: Pt reports pain in her knee (both medially and laterally) and into her quads in sitting. Pt reports pain actively in extending knee and with passive extension. She reports pain with hamstring MMT as well. Overall strength appeared to be 4/5. Decreased ROM noted in ankle - unclear whether pt was resisting movement or if range was that limited. RLE Sensation: WNL LLE Deficits / Details: Pt has the same complaints as the RLE however states the L is less painful than the R. LLE Sensation: WNL    Cervical / Trunk Assessment Cervical / Trunk Assessment: Other exceptions Cervical / Trunk Exceptions: Forward head posture with rounded shoulders  Communication   Communication: No difficulties  Cognition Arousal/Alertness: Awake/alert Behavior During Therapy: WFL for tasks assessed/performed Overall Cognitive Status: Within Functional Limits for tasks assessed                                          General Comments       Exercises     Assessment/Plan    PT Assessment Patient needs continued PT services  PT Problem List Decreased strength;Decreased activity tolerance;Decreased balance;Decreased mobility;Decreased knowledge of use of DME;Decreased safety awareness;Decreased knowledge of precautions;Pain       PT Treatment Interventions DME instruction;Gait training;Functional mobility training;Therapeutic activities;Therapeutic exercise;Balance training;Neuromuscular re-education;Patient/family education;Cognitive remediation    PT Goals (Current goals can be found in the Care Plan section)  Acute Rehab PT Goals Patient Stated Goal: Get back to her independent living facility PT Goal Formulation: With patient Time For Goal Achievement: 12/28/21 Potential to Achieve Goals: Good    Frequency Min 3X/week     Co-evaluation PT/OT/SLP Co-Evaluation/Treatment: Yes Reason for Co-Treatment: Complexity of the patient's impairments (multi-system involvement);For patient/therapist safety;To address functional/ADL transfers;Necessary to address cognition/behavior during functional activity PT goals addressed during session: Mobility/safety with mobility;Balance;Proper use of DME;Strengthening/ROM         AM-PAC PT "6 Clicks" Mobility  Outcome Measure Help needed turning from your back to your side while in a flat bed without using bedrails?: A Lot Help needed moving from lying on your back to sitting on the side of a flat bed without using bedrails?: A Lot Help needed moving to and from a bed to a chair (including a wheelchair)?: A Lot Help needed standing up from a  chair using your arms (e.g., wheelchair or bedside chair)?: A Lot Help needed to walk in hospital room?: Total Help needed climbing 3-5 steps with a railing? : Total 6 Click Score: 10    End of Session Equipment Utilized During Treatment: Gait belt Activity Tolerance: Patient tolerated treatment well Patient left: in chair;with call  bell/phone within reach;with chair alarm set Nurse Communication: Mobility status PT Visit Diagnosis: Unsteadiness on feet (R26.81);Pain Pain - part of body: Knee    Time: [id number] PT Time Calculation (min) (ACUTE ONLY): 25 min   Charges:   PT Evaluation $PT Eval Moderate Complexity: 1 Mod          Rolinda Roan, PT, DPT Acute Rehabilitation Services Secure Chat Preferred Office: [phone]   Thelma Comp 12/21/2021, 12:50 PM

## 2021-12-21 NOTE — Plan of Care (Signed)
Pt is alert to self and time. Pt disoriented to place and situation. MRI called during this shift. They attempted to contact patients daughter. Unable to reach her and at this time unable to complete mri due to need hx to complete MRI or further xray prior to MRI. At this time no further information from MRI.  Problem: Education: Goal: Knowledge of General Education information will improve Description: Including pain rating scale, medication(s)/side effects and non-pharmacologic comfort measures Outcome: Progressing   Problem: Health Behavior/Discharge Planning: Goal: Ability to manage health-related needs will improve Outcome: Progressing   Problem: Clinical Measurements: Goal: Ability to maintain clinical measurements within normal limits will improve Outcome: Progressing Goal: Will remain free from infection Outcome: Progressing Goal: Diagnostic test results will improve Outcome: Progressing Goal: Respiratory complications will improve Outcome: Progressing Goal: Cardiovascular complication will be avoided Outcome: Progressing   Problem: Activity: Goal: Risk for activity intolerance will decrease Outcome: Progressing   Problem: Nutrition: Goal: Adequate nutrition will be maintained Outcome: Progressing   Problem: Coping: Goal: Level of anxiety will decrease Outcome: Progressing   Problem: Elimination: Goal: Will not experience complications related to bowel motility Outcome: Progressing Goal: Will not experience complications related to urinary retention Outcome: Progressing   Problem: Pain Managment: Goal: General experience of comfort will improve Outcome: Progressing   Problem: Safety: Goal: Ability to remain free from injury will improve Outcome: Progressing   Problem: Skin Integrity: Goal: Risk for impaired skin integrity will decrease Outcome: Progressing   Problem: Education: Goal: Knowledge of disease or condition will improve Outcome:  Progressing Goal: Knowledge of secondary prevention will improve (SELECT ALL) Outcome: Progressing Goal: Knowledge of patient specific risk factors will improve (INDIVIDUALIZE FOR PATIENT) Outcome: Progressing Goal: Individualized Educational Video(s) Outcome: Progressing   Problem: Health Behavior/Discharge Planning: Goal: Ability to manage health-related needs will improve Outcome: Progressing   Problem: Ischemic Stroke/TIA Tissue Perfusion: Goal: Complications of ischemic stroke/TIA will be minimized Outcome: Progressing

## 2021-12-22 DIAGNOSIS — R42 Dizziness and giddiness: Secondary | ICD-10-CM | POA: Diagnosis not present

## 2021-12-22 MED ORDER — ACETAMINOPHEN 325 MG PO TABS
650.0000 mg | ORAL_TABLET | Freq: Four times a day (QID) | ORAL | Status: DC | PRN
  Administered 2021-12-22 – 2021-12-26 (×3): 650 mg via ORAL
  Filled 2021-12-22 (×3): qty 2

## 2021-12-22 NOTE — Progress Notes (Signed)
PROGRESS NOTE    Candice Velasquez  [id number] DOB: 1951-03-12 DOA: 12/19/2021 PCP: Deland Pretty, MD   Brief Narrative:  HPI: Candice Velasquez is a 71 y.o. female with medical history significant of anxiety and depression, HTN, dizziness/vertigo, hx of cerebellar lacunar infarct, memory impairment who presented to ED with complaints of dizziness and weakness. Poor historian - per family - due to hip pain, worsening tremor and dizziness they took her to ED. Her daughter states he was able to walk in to ED fine on her own prior to admission.   Seen by neurology in 10/2021 with complaints of dizziness at that time. Suspected largely vestibular involvement and undergoing vestibular rehab.  History of tremors.neurology note: 11/21:  Negative EMG studies. Doubted primary PD. ? Medication induced akathesia and tremors.  Assessment & Plan:   Principal Problem:   Dizziness Active Problems:   Elevated lipase   Anxiety and depression   Insomnia due to other mental disorder   Memory loss  Dizziness, cannot rule out vertigo/inner ear dysfunction(unspecified) Acute CVA ruled out - CT head negative.  MRI negative for acute findings (chronic microvascular changes/infarcts noted) - PT is also consulted for possible vestibular therapies.   - Meclizine PRN - PT eval pending - likely benefit from SNF placement   Elevated lipase: Nonspecific and insignificant.  Resolved.   Memory loss Recently seen by neurology with further testing recommended  delirium precautions continued   Insomnia due to other mental disorder Hx of OSA, but not using cpap in past notes Continue trazodone prn   Essential hypertension: Resume triamterene and hydrochlorothiazide.   Anxiety and depression Has been forgetting morning meds and not taking consistently. Stopping paxil cold [place] could be contributing to some of her symptoms Discussed importance of taking prescribed medication Discussed with daughter  pill container to help with her memory deficit and taking medication in the AM   Hypokalemia: Replaced.  DVT prophylaxis: Place and maintain sequential compression device Start: 12/20/21 1541 Place TED hose Start: 12/20/21 1523   Code Status: Full Code  Family Communication:  Daughter at bedside.   Status is: Inpt The patient will require care spanning > 2 midnights and should be moved to inpatient because: Patient is still very dizzy.   Estimated body mass index is 29.18 kg/m as calculated from the following:   Height as of this encounter: 5\' 6"  (1.676 m).   Weight as of this encounter: 82 kg.    Nutritional Assessment: Body mass index is 29.18 kg/m.Marland Kitchen Seen by dietician.  I agree with the assessment and plan as outlined below: Nutrition Status:        . Skin Assessment: I have examined the patient's skin and I agree with the wound assessment as performed by the wound care RN as outlined below:    Consultants:  None   Procedures:  None  Antimicrobials:  Anti-infectives (From admission, onward)    None         Subjective: Patient seen and examined.  Still complains of dizziness even when she is laying in the bed.  She has no other complaint.  She is fully alert and oriented however I question her capacity to make decisions.  Patient continues to talk about a musical show that is scheduled to happen at her independent living facility today.  She really wants to get there at 6 PM and does not want to miss it.  She keeps repeating the same thing over and over again.  Objective: Vitals:  12/21/21 1500 12/21/21 2030 12/22/21 0013 12/22/21 0447  BP: (!) 153/84 131/66 135/79 139/68  Pulse: 78 73 68 63  Resp: 18 20 18 18   Temp: 98.3 F (36.8 C) 98.3 F (36.8 C) 98.7 F (37.1 C) 98.5 F (36.9 C)  TempSrc: Oral Oral Oral Oral  SpO2: 98% 98% 98% 99%  Weight:      Height:        Intake/Output Summary (Last 24 hours) at 12/22/2021 0734 Last data filed at  12/22/2021 [id number] Gross per 24 hour  Intake 240 ml  Output 2000 ml  Net -1760 ml    Filed Weights   12/19/21 0701 12/20/21 1743  Weight: 81.6 kg 82 kg    Examination:  General exam: Appears calm and comfortable  Respiratory system: Clear to auscultation. Respiratory effort normal. Cardiovascular system: S1 & S2 heard, RRR. No JVD, murmurs, rubs, gallops or clicks. No pedal edema. Gastrointestinal system: Abdomen is nondistended, soft and nontender. No organomegaly or masses felt. Normal bowel sounds heard. Central nervous system: Alert and oriented. No focal neurological deficits. Extremities: Symmetric 5 x 5 power. Skin: No rashes, lesions or ulcers Psychiatry: Judgement and insight appear poor  Data Reviewed: I have personally reviewed following labs and imaging studies  CBC: Recent Labs  Lab 12/19/21 0715 12/21/21 0432  WBC 7.7 4.8  HGB 14.4 11.8*  HCT 41.6 33.9*  MCV 93.3 93.6  PLT 236 [id number]    Basic Metabolic Panel: Recent Labs  Lab 12/19/21 0715 12/21/21 0432  NA 142 140  K 3.8 3.2*  CL 109 111  CO2 24 19*  GLUCOSE 131* 82  BUN 11 8  CREATININE 0.87 0.69  CALCIUM 9.3 8.4*    GFR: Estimated Creatinine Clearance: 71.7 mL/min (by C-G formula based on SCr of 0.69 mg/dL). Liver Function Tests: Recent Labs  Lab 12/19/21 0715  AST 19  ALT 15  ALKPHOS 52  BILITOT 0.8  PROT 7.8  ALBUMIN 4.6    Recent Labs  Lab 12/19/21 1022 12/20/21 1543  LIPASE 67* 26    No results for input(s): "AMMONIA" in the last 168 hours. Coagulation Profile: No results for input(s): "INR", "PROTIME" in the last 168 hours. Cardiac Enzymes: No results for input(s): "CKTOTAL", "CKMB", "CKMBINDEX", "TROPONINI" in the last 168 hours. BNP (last 3 results) No results for input(s): "PROBNP" in the last 8760 hours. HbA1C: Recent Labs    12/20/21 1543  HGBA1C 5.2    CBG: Recent Labs  Lab 12/19/21 0654  GLUCAP 132*    Lipid Profile: Recent Labs    12/21/21 0432   CHOL 112  HDL 53  LDLCALC 53  TRIG 30  CHOLHDL 2.1    Thyroid Function Tests: Recent Labs    12/20/21 1543  TSH 1.089    Anemia Panel: No results for input(s): "VITAMINB12", "FOLATE", "FERRITIN", "TIBC", "IRON", "RETICCTPCT" in the last 72 hours. Sepsis Labs: No results for input(s): "PROCALCITON", "LATICACIDVEN" in the last 168 hours.  No results found for this or any previous visit (from the past 240 hour(s)).   Radiology Studies: CT CHEST WO CONTRAST  Result Date: 12/21/2021 CLINICAL DATA:  MRI clearance, assessing for metallic object in the chest. EXAM: CT CHEST WITHOUT CONTRAST TECHNIQUE: Multidetector CT imaging of the chest was performed following the standard protocol without IV contrast. RADIATION DOSE REDUCTION: This exam was performed according to the departmental dose-optimization program which includes automated exposure control, adjustment of the mA and/or kV according to patient size and/or use of iterative reconstruction technique.  COMPARISON:  December 21, 2021. FINDINGS: Cardiovascular: Calcified aortic atherosclerosis minimal calcified plaque in the thoracic aorta. Normal caliber of the thoracic aorta. Normal caliber of central pulmonary vessels. Normal heart size. Scattered coronary artery calcifications of LEFT coronary circulation. Mediastinum/Nodes: No thoracic inlet, axillary, mediastinal or hilar adenopathy. Esophagus grossly normal. Lungs/Pleura: Basilar atelectasis. No effusion. No consolidative changes. Airways are patent. Upper Abdomen: Incidental imaging of upper abdominal contents is unremarkable. Musculoskeletal: No acute bone finding. No destructive bone process. Spinal degenerative changes. On the scout image obtained for this exam the metallic structure that was seen on the previous radiograph is no longer visible. There are EKG leads over the chest. IMPRESSION: 1. No acute cardiopulmonary process. 2. Scattered coronary artery calcifications of LEFT coronary  circulation. 3. Aortic atherosclerosis. 4. Metallic foreign body or structure seen on previous radiograph is no longer visible on scout image obtained for CT and not seen on CT images. Aortic Atherosclerosis (ICD10-I70.0). Electronically Signed   By: Zetta Bills M.D.   On: 12/21/2021 13:53   MR BRAIN WO CONTRAST  Result Date: 12/21/2021 CLINICAL DATA:  Transient ischemic attack. EXAM: MRI HEAD WITHOUT CONTRAST TECHNIQUE: Multiplanar, multiecho pulse sequences of the brain and surrounding structures were obtained without intravenous contrast. COMPARISON:  Head CT December 19, 2021. MRI of the brain July 11, 2021. FINDINGS: Brain: No acute infarction, hemorrhage, hydrocephalus, extra-axial collection or mass lesion. Small remote infarcts in the bilateral cerebellar hemispheres. Scattered and confluent foci of T2 hyperintensity are seen within the white matter of cerebral hemispheres, nonspecific, unchanged when compared to prior MRI. Mild parenchymal volume loss is also unchanged. Vascular: Normal flow voids. Skull and upper cervical spine: Normal marrow signal. Sinuses/Orbits: Negative. Other: None. IMPRESSION: 1. No acute intracranial abnormality. 2. Moderate chronic microvascular ischemic changes of the white matter, stable. 3. Small remote infarcts in the bilateral cerebellar hemispheres. Electronically Signed   By: Pedro Earls M.D.   On: 12/21/2021 13:49   ECHOCARDIOGRAM COMPLETE  Result Date: 12/21/2021    ECHOCARDIOGRAM REPORT   Patient Name:   LOUIZA MOOR Date of Exam: 12/21/2021 Medical Rec #:  [id number]            Height:       66.0 in Accession #:    [id number]           Weight:       180.8 lb Date of Birth:  [birth year]           BSA:          1.916 m Patient Age:    56 years             BP:           147/77 mmHg Patient Gender: F                    HR:           74 bpm. Exam Location:  Inpatient Procedure: 2D Echo and Intracardiac Opacification Agent Indications:    TIA   History:        Patient has no prior history of Echocardiogram examinations.                 COPD, Signs/Symptoms:Dizziness/Lightheadedness; Risk                 Factors:Current Smoker.  Sonographer:    Harvie Junior Referring Phys: [id number] [facility]  Sonographer Comments: Technically difficult study due to poor echo windows, suboptimal  parasternal window and patient is obese. Image acquisition challenging due to patient body habitus, Image acquisition challenging due to respiratory motion and supine. IMPRESSIONS  1. Left ventricular ejection fraction, by estimation, is 65 to 70%. The left ventricle has normal function. The left ventricle has no regional wall motion abnormalities. Left ventricular diastolic parameters are indeterminate.  2. Right ventricular systolic function is normal. The right ventricular size is normal.  3. The mitral valve is normal in structure. No evidence of mitral valve regurgitation. No evidence of mitral stenosis.  4. The aortic valve is normal in structure. There is mild calcification of the aortic valve. Aortic valve regurgitation is not visualized. Aortic valve sclerosis is present, with no evidence of aortic valve stenosis.  5. The inferior vena cava is normal in size with greater than 50% respiratory variability, suggesting right atrial pressure of 3 mmHg. FINDINGS  Left Ventricle: Left ventricular ejection fraction, by estimation, is 65 to 70%. The left ventricle has normal function. The left ventricle has no regional wall motion abnormalities. Definity contrast agent was given IV to delineate the left ventricular  endocardial borders. The left ventricular internal cavity size was normal in size. There is no left ventricular hypertrophy. Left ventricular diastolic parameters are indeterminate. Right Ventricle: The right ventricular size is normal. No increase in right ventricular wall thickness. Right ventricular systolic function is normal. Left Atrium: Left atrial size was  normal in size. Right Atrium: Right atrial size was normal in size. Pericardium: There is no evidence of pericardial effusion. Mitral Valve: The mitral valve is normal in structure. Mild mitral annular calcification. No evidence of mitral valve regurgitation. No evidence of mitral valve stenosis. Tricuspid Valve: The tricuspid valve is normal in structure. Tricuspid valve regurgitation is not demonstrated. No evidence of tricuspid stenosis. Aortic Valve: The aortic valve is normal in structure. There is mild calcification of the aortic valve. Aortic valve regurgitation is not visualized. Aortic valve sclerosis is present, with no evidence of aortic valve stenosis. Pulmonic Valve: The pulmonic valve was normal in structure. Pulmonic valve regurgitation is not visualized. No evidence of pulmonic stenosis. Aorta: The aortic root is normal in size and structure. Venous: The inferior vena cava is normal in size with greater than 50% respiratory variability, suggesting right atrial pressure of 3 mmHg. IAS/Shunts: No atrial level shunt detected by color flow Doppler.  LEFT VENTRICLE PLAX 2D LVIDd:         4.70 cm     Diastology LVIDs:         3.00 cm     LV e' medial:    7.30 cm/s LV PW:         0.90 cm     LV E/e' medial:  17.9 LV IVS:        0.90 cm     LV e' lateral:   9.64 cm/s LVOT diam:     1.90 cm     LV E/e' lateral: 13.6 LV SV:         64 LV SV Index:   33 LVOT Area:     2.84 cm  LV Volumes (MOD) LV vol d, MOD A2C: 67.1 ml LV vol d, MOD A4C: 74.7 ml LV vol s, MOD A2C: 27.3 ml LV vol s, MOD A4C: 23.8 ml LV SV MOD A2C:     39.8 ml LV SV MOD A4C:     74.7 ml LV SV MOD BP:      49.0 ml RIGHT VENTRICLE RV S prime:  13.40 cm/s TAPSE (M-mode): 1.3 cm LEFT ATRIUM             Index        RIGHT ATRIUM          Index LA diam:        3.20 cm 1.67 cm/m   RA Area:     9.32 cm LA Vol (A2C):   21.9 ml 11.43 ml/m  RA Volume:   18.80 ml 9.81 ml/m LA Vol (A4C):   51.7 ml 26.99 ml/m LA Biplane Vol: 36.4 ml 19.00 ml/m   AORTIC VALVE             PULMONIC VALVE LVOT Vmax:   110.00 cm/s PV Vmax:       1.15 m/s LVOT Vmean:  74.200 cm/s PV Peak grad:  5.3 mmHg LVOT VTI:    0.224 m  AORTA Ao Root diam: 3.70 cm MITRAL VALVE MV Area (PHT): 4.15 cm     SHUNTS MV Decel Time: 183 msec     Systemic VTI:  0.22 m MV E velocity: 131.00 cm/s  Systemic Diam: 1.90 cm MV A velocity: 132.00 cm/s MV E/A ratio:  0.99 Mihai Croitoru MD Electronically signed by Sanda Klein MD Signature Date/Time: 12/21/2021/10:51:39 AM    Final    DG CHEST PORT 1 VIEW  Result Date: 12/21/2021 CLINICAL DATA:  Metal screening for MRI. EXAM: PORTABLE CHEST 1 VIEW COMPARISON:  Chest CT 12/23/2020. FINDINGS: The lungs are clear without focal pneumonia, edema, pneumothorax or pleural effusion. The cardiopericardial silhouette is within normal limits for size. Radiopaque foreign body projects over the heart and may be metallic. Telemetry leads overlie the chest. IMPRESSION: Radiopaque foreign body projects over the heart, not present on prior CT from 12/23/2020. This may be metallic. Location of this foreign body cannot be determined on a single frontal projection. Consider chest CT without contrast to further evaluate as clinically warranted. Electronically Signed   By: Misty Stanley M.D.   On: 12/21/2021 09:24    Scheduled Meds:  aspirin EC  81 mg Oral Daily   PARoxetine  30 mg Oral Daily   traZODone  50 mg Oral QHS   triamterene-hydrochlorothiazide  0.5 tablet Oral Daily   Continuous Infusions:  sodium chloride 1,000 mL (12/21/21 0647)     LOS: 1 day   Little Ishikawa, MD [facility]  12/22/2021, 7:34 AM   *Please note that this is a verbal dictation therefore any spelling or grammatical errors are due to the "[place] [employer]" system interpretation.  Please page via [place] and do not message via secure chat for urgent patient care matters. Secure chat can be used for non urgent patient care matters.  How to contact the [facility] Attending  or Consulting provider [place] or covering provider during after hours [place], for this patient?  Check the care team in [facility] and look for a) attending/consulting TRH provider listed and b) the [facility] team listed. Page or secure chat [phone]. Log into [email or web address] and use [facility]'s universal password to access. If you do not have the password, please contact the hospital operator. Locate the [facility] provider you are looking for under [facility] and page to a number that you can be directly reached. If you still have difficulty reaching the provider, please page the [facility] (Director on Call) for the Hospitalists listed on amion for assistance.

## 2021-12-22 NOTE — TOC Progression Note (Signed)
Transition of Care [facility]) - Progression Note    Patient Details  Name: Candice Velasquez MRN: [id number] Date of Birth: 12-08-1950  Transition of Care [facility]) CM/SW [place], [place] Phone Number: 12/22/2021, 3:02 PM  Clinical Narrative:   CSW spoke with daughter, [name], to provide and discuss bed offers for SNF. Daughter to review options and discuss with patient, will update CSW with SNF choice. Daughter also asking for MD to call to discuss MRI results, CSW sent MD a message. CSW to follow.    Expected Discharge Plan: [place] Barriers to Discharge: Continued Medical Work up, Ship broker  Expected Discharge Plan and Services Expected Discharge Plan: [place] Choice: [place] arrangements for the past 2 months: [place]                                       Social Determinants of Health (SDOH) Interventions Housing Interventions: Intervention Not Indicated, Patient Refused  Readmission Risk Interventions     No data to display

## 2021-12-22 NOTE — Progress Notes (Signed)
Mobility Specialist: Progress Note   12/22/21 1539  Mobility  Activity Ambulated with assistance in room  Activity Response Tolerated fair  Distance Ambulated (ft) 40 ft (20'x2)  $Mobility charge 1 Mobility  Level of Assistance Moderate assist, patient does 50-74%  Assistive Device Front wheel walker   Pt received in the bed and agreeable to mobility. Required modA to sit EOB from supine as well as to stand. Cues for RW management and upright posture. Mod-heavy posterior lean during ambulation. Stopped x1 for seated break secondary to fatigue. Pt assisted to the BR per request during ambulation then back to bed once finished. Pt back in bed with call bell and phone in reach.   [facility] Monnica Saltsman Mobility Specialist Mobility Specialist [address]: [phone]

## 2021-12-22 NOTE — Plan of Care (Signed)
  Problem: Education: Goal: Knowledge of General Education information will improve Description: Including pain rating scale, medication(s)/side effects and non-pharmacologic comfort measures Outcome: Progressing   Problem: Health Behavior/Discharge Planning: Goal: Ability to manage health-related needs will improve Outcome: Progressing   Problem: Clinical Measurements: Goal: Ability to maintain clinical measurements within normal limits will improve Outcome: Progressing Goal: Will remain free from infection Outcome: Progressing Goal: Diagnostic test results will improve Outcome: Progressing Goal: Respiratory complications will improve Outcome: Progressing Goal: Cardiovascular complication will be avoided Outcome: Progressing   Problem: Activity: Goal: Risk for activity intolerance will decrease Outcome: Progressing   Problem: Nutrition: Goal: Adequate nutrition will be maintained Outcome: Progressing   Problem: Coping: Goal: Level of anxiety will decrease Outcome: Progressing   Problem: Elimination: Goal: Will not experience complications related to bowel motility Outcome: Progressing Goal: Will not experience complications related to urinary retention Outcome: Progressing   Problem: Pain Managment: Goal: General experience of comfort will improve Outcome: Progressing   Problem: Safety: Goal: Ability to remain free from injury will improve Outcome: Progressing   Problem: Skin Integrity: Goal: Risk for impaired skin integrity will decrease Outcome: Progressing   Problem: Education: Goal: Knowledge of disease or condition will improve Outcome: Progressing Goal: Knowledge of secondary prevention will improve (SELECT ALL) Outcome: Progressing Goal: Knowledge of patient specific risk factors will improve (INDIVIDUALIZE FOR PATIENT) Outcome: Progressing Goal: Individualized Educational Video(s) Outcome: Progressing   Problem: Health Behavior/Discharge  Planning: Goal: Ability to manage health-related needs will improve Outcome: Progressing   Problem: Ischemic Stroke/TIA Tissue Perfusion: Goal: Complications of ischemic stroke/TIA will be minimized Outcome: Progressing   

## 2021-12-22 NOTE — Progress Notes (Signed)
Physical Therapy Treatment Patient Details Name: Candice Velasquez MRN: [id number] DOB: 1950/10/29 Today's Date: 12/22/2021   History of Present Illness Pt is a 71 y/o female presenting on 12/19/21 with dizziness and generalized weakness. PMH includes: anxiety, depression, HTN, dizziness/vertigo (was seeing vestibular PT), CVA (cerebellar lacunar infarct), memory impairment.    PT Comments    Pt with improved transfer ability and was able to begin gait training. Pt continues with posterior bias but denies dizziness and is responsive to tactile cues provided by PT for anterior weight shift. Pt asked about using a rollator. PT educated pt on how the rollator is more mobile and difficulty to control and she would need to have better balance to use one. Pt agreed after trying to amb for the first time today. Pt very motivated to improve and return to [facility]. Acute PT to cont to follow.    Recommendations for follow up therapy are one component of a multi-disciplinary discharge planning process, led by the attending physician.  Recommendations may be updated based on patient status, additional functional criteria and insurance authorization.  Follow Up Recommendations  Skilled nursing-short term rehab (<3 hours/day) Can patient physically be transported by private vehicle: No   Assistance Recommended at Discharge Frequent or constant Supervision/Assistance  Patient can return home with the following Two people to help with walking and/or transfers;Assistance with cooking/housework;Assist for transportation;Help with stairs or ramp for entrance   Equipment Recommendations  Other (comment) (TBD by next venue of care)    Recommendations for Other Services       Precautions / Restrictions Precautions Precautions: Fall Restrictions Weight Bearing Restrictions: No     Mobility  Bed Mobility Overal bed mobility: Needs Assistance Bed Mobility: Rolling, Sidelying to Sit Rolling: Min  assist Sidelying to sit: Min assist       General bed mobility comments: Pt reaching out for therapist's hand to transition to sidelying. Assist provided for LE's off EOB and for trunk elevation to full sitting position. Increased time to scoot out to get feet on the floor.    Transfers Overall transfer level: Needs assistance Equipment used: Rolling walker (2 wheels) Transfers: Sit to/from Stand, Bed to chair/wheelchair/BSC Sit to Stand: Mod assist           General transfer comment: modAx1 to power up, pt with posterior bias requiring posterior tactile cues to transition weight forward    Ambulation/Gait Ambulation/Gait assistance: Mod assist, +2 safety/equipment (2nd person for chair follow) Gait Distance (Feet): 10 Feet (x1, 15'x1) Assistive device: Rolling walker (2 wheels) Gait Pattern/deviations: Step-to pattern, Decreased stride length Gait velocity: dec Gait velocity interpretation: <1.31 ft/sec, indicative of household ambulator   General Gait Details: pt with bilat feet in PF walking more on balls of feet, pt very dependent on UEs with posterior bias. PT to have to hold RW down to stay on floor and provide tactile cues for anterior weight shift. Pt vearing to the R as well   Stairs             Wheelchair Mobility    Modified Rankin (Stroke Patients Only)       Balance Overall balance assessment: Needs assistance Sitting-balance support: Single extremity supported Sitting balance-Leahy Scale: Poor Sitting balance - Comments: Difficulty holding midline balance without UE support   Standing balance support: During functional activity, Bilateral upper extremity supported, Reliant on assistive device for balance Standing balance-Leahy Scale: Poor Standing balance comment: Reliant on UE support and +2 assist, posterior lean preference  Cognition Arousal/Alertness: Awake/alert Behavior During Therapy: WFL for tasks  assessed/performed Overall Cognitive Status: Impaired/Different from baseline Area of Impairment: Safety/judgement, Awareness, Problem solving, Following commands, Memory                   Current Attention Level: Sustained   Following Commands: Follows one step commands consistently, Follows one step commands with increased time, Follows multi-step commands inconsistently   Awareness: Emergent Problem Solving: Slow processing, Difficulty sequencing, Requires verbal cues, Decreased initiation General Comments: pt more alert today with awareness of deficits        Exercises      General Comments General comments (skin integrity, edema, etc.): VSS      Pertinent Vitals/Pain Pain Assessment Pain Assessment: Faces Faces Pain Scale: No hurt    Home Living                          Prior Function            PT Goals (current goals can now be found in the care plan section) Acute Rehab PT Goals Patient Stated Goal: Get back to her independent living facility PT Goal Formulation: With patient Time For Goal Achievement: 12/28/21 Potential to Achieve Goals: Good Progress towards PT goals: Progressing toward goals    Frequency    Min 3X/week      PT Plan Current plan remains appropriate    Co-evaluation              AM-PAC PT "6 Clicks" Mobility   Outcome Measure  Help needed turning from your back to your side while in a flat bed without using bedrails?: A Lot Help needed moving from lying on your back to sitting on the side of a flat bed without using bedrails?: A Lot Help needed moving to and from a bed to a chair (including a wheelchair)?: A Lot Help needed standing up from a chair using your arms (e.g., wheelchair or bedside chair)?: A Lot Help needed to walk in hospital room?: Total Help needed climbing 3-5 steps with a railing? : Total 6 Click Score: 10    End of Session Equipment Utilized During Treatment: Gait belt Activity  Tolerance: Patient tolerated treatment well Patient left: in chair;with call bell/phone within reach;with chair alarm set Nurse Communication: Mobility status PT Visit Diagnosis: Unsteadiness on feet (R26.81);Pain Pain - part of body: Knee     Time: 1008-1040 PT Time Calculation (min) (ACUTE ONLY): 32 min  Charges:  $Gait Training: 23-37 mins                     Kittie Plater, PT, DPT Acute Rehabilitation Services Secure chat preferred Office #: [phone]    Berline Lopes 12/22/2021, 2:25 PM

## 2021-12-23 DIAGNOSIS — R42 Dizziness and giddiness: Secondary | ICD-10-CM | POA: Diagnosis not present

## 2021-12-23 MED ORDER — TRAMADOL HCL 50 MG PO TABS
50.0000 mg | ORAL_TABLET | Freq: Once | ORAL | Status: AC | PRN
  Administered 2021-12-23: 50 mg via ORAL
  Filled 2021-12-23: qty 1

## 2021-12-23 MED ORDER — DICLOFENAC SODIUM 1 % EX GEL
2.0000 g | Freq: Four times a day (QID) | CUTANEOUS | Status: DC
  Filled 2021-12-23: qty 100

## 2021-12-23 NOTE — Progress Notes (Signed)
Mobility Specialist: Progress Note   12/23/21 1659  Mobility  Activity Transferred from bed to chair  Activity Response Tolerated fair  Distance Ambulated (ft) 2 ft  $Mobility charge 1 Mobility  Level of Assistance Moderate assist, patient does 50-74%  Assistive Device Front wheel walker   Pt received in the bed and agreeable to mobility. Pt declining ambulation secondary to pain level but agreeable to sit in the chair. Pt required minA with bed mobility and heavy modA to stand d/t heavy posterior lean. Pt is in the chair, set up with her dinner with call bell and phone in reach.  [facility] Candice Velasquez Mobility Specialist Mobility Specialist [address]: [phone]

## 2021-12-23 NOTE — Plan of Care (Signed)
  Problem: Education: Goal: Knowledge of General Education information will improve Description: Including pain rating scale, medication(s)/side effects and non-pharmacologic comfort measures 12/23/2021 1846 by Laure Kidney, RN Outcome: Progressing 12/23/2021 1846 by Laure Kidney, RN Outcome: Progressing   Problem: Health Behavior/Discharge Planning: Goal: Ability to manage health-related needs will improve 12/23/2021 1846 by Laure Kidney, RN Outcome: Progressing 12/23/2021 1846 by Laure Kidney, RN Outcome: Progressing   Problem: Clinical Measurements: Goal: Ability to maintain clinical measurements within normal limits will improve 12/23/2021 1846 by Laure Kidney, RN Outcome: Progressing 12/23/2021 1846 by Laure Kidney, RN Outcome: Progressing Goal: Will remain free from infection 12/23/2021 1846 by Laure Kidney, RN Outcome: Progressing 12/23/2021 1846 by Laure Kidney, RN Outcome: Progressing Goal: Diagnostic test results will improve 12/23/2021 1846 by Laure Kidney, RN Outcome: Progressing 12/23/2021 1846 by Laure Kidney, RN Outcome: Progressing Goal: Respiratory complications will improve 12/23/2021 1846 by Laure Kidney, RN Outcome: Progressing 12/23/2021 1846 by Laure Kidney, RN Outcome: Progressing Goal: Cardiovascular complication will be avoided 12/23/2021 1846 by Laure Kidney, RN Outcome: Progressing 12/23/2021 1846 by Laure Kidney, RN Outcome: Progressing   Problem: Nutrition: Goal: Adequate nutrition will be maintained 12/23/2021 1846 by Laure Kidney, RN Outcome: Progressing 12/23/2021 1846 by Laure Kidney, RN Outcome: Progressing   Problem: Elimination: Goal: Will not experience complications related to bowel motility 12/23/2021 1846 by Laure Kidney, RN Outcome: Progressing 12/23/2021 1846 by Laure Kidney, RN Outcome: Progressing Goal: Will not experience complications related to urinary retention 12/23/2021 1846 by Laure Kidney,  RN Outcome: Progressing 12/23/2021 1846 by Laure Kidney, RN Outcome: Progressing   Problem: Safety: Goal: Ability to remain free from injury will improve 12/23/2021 1846 by Laure Kidney, RN Outcome: Progressing 12/23/2021 1846 by Laure Kidney, RN Outcome: Progressing   Problem: Skin Integrity: Goal: Risk for impaired skin integrity will decrease 12/23/2021 1846 by Laure Kidney, RN Outcome: Progressing 12/23/2021 1846 by Laure Kidney, RN Outcome: Progressing   Problem: Education: Goal: Knowledge of disease or condition will improve 12/23/2021 1846 by Laure Kidney, RN Outcome: Progressing 12/23/2021 1846 by Laure Kidney, RN Outcome: Progressing Goal: Knowledge of secondary prevention will improve (SELECT ALL) 12/23/2021 1846 by Laure Kidney, RN Outcome: Progressing 12/23/2021 1846 by Laure Kidney, RN Outcome: Progressing Goal: Knowledge of patient specific risk factors will improve (INDIVIDUALIZE FOR PATIENT) 12/23/2021 1846 by Laure Kidney, RN Outcome: Progressing 12/23/2021 1846 by Laure Kidney, RN Outcome: Progressing Goal: Individualized Educational Video(s) 12/23/2021 1846 by Laure Kidney, RN Outcome: Progressing 12/23/2021 1846 by Laure Kidney, RN Outcome: Progressing   Problem: Health Behavior/Discharge Planning: Goal: Ability to manage health-related needs will improve 12/23/2021 1846 by Laure Kidney, RN Outcome: Progressing 12/23/2021 1846 by Laure Kidney, RN Outcome: Progressing   Problem: Ischemic Stroke/TIA Tissue Perfusion: Goal: Complications of ischemic stroke/TIA will be minimized 12/23/2021 1846 by Laure Kidney, RN Outcome: Progressing 12/23/2021 1846 by Laure Kidney, RN Outcome: Progressing

## 2021-12-23 NOTE — Progress Notes (Signed)
PROGRESS NOTE    Candice Velasquez  ENI:[id number] DOB: September 03, 1950 DOA: 12/19/2021 PCP: Merri Brunette, MD   Brief Narrative:  HPI: Candice Velasquez is a 71 y.o. female with medical history significant of anxiety and depression, HTN, dizziness/vertigo, hx of cerebellar lacunar infarct, memory impairment who presented to ED with complaints of dizziness and weakness. Poor historian - per family - due to hip pain, worsening tremor and dizziness they took her to ED. Her daughter states he was able to walk in to ED fine on her own prior to admission.   Seen by neurology in 10/2021 with complaints of dizziness at that time. Suspected largely vestibular involvement and undergoing vestibular rehab.  History of tremors.neurology note: 11/21:  Negative EMG studies. Doubted primary PD. ? Medication induced akathesia and tremors.  Patient awaiting discharge to SNF, pending bed availability and insurance approval patient is otherwise agreeable and stable for discharge  Assessment & Plan:   Principal Problem:   Dizziness Active Problems:   Elevated lipase   Anxiety and depression   Insomnia due to other mental disorder   Memory loss  Dizziness, cannot rule out vertigo/inner ear dysfunction(unspecified) Acute CVA ruled out - CT head negative.  MRI negative for acute findings (chronic microvascular changes/infarcts noted) - PT is also consulted for possible vestibular therapies.   - Meclizine PRN - PT recommending SNF placement   Elevated lipase: Nonspecific and insignificant.  Resolved.   Memory loss Recently seen by neurology with further testing recommended  delirium precautions continued   Insomnia due to other mental disorder Hx of OSA, but not using cpap in past notes Continue trazodone prn   Essential hypertension: Resume triamterene and hydrochlorothiazide.   Anxiety and depression Has been forgetting morning meds and not taking consistently. Stopping paxil cold [place] could  be contributing to some of her symptoms Discussed importance of taking prescribed medication  Hypokalemia: Replaced.  DVT prophylaxis: Place and maintain sequential compression device Start: 12/20/21 1541 Place TED hose Start: 12/20/21 1523   Code Status: Full Code  Family Communication:  Daughter at bedside.   Status is: Inpt The patient will require care spanning > 2 midnights and should be moved to inpatient because: Patient is still very dizzy.   Estimated body mass index is 29.18 kg/m as calculated from the following:   Height as of this encounter: 5\' 6"  (1.676 m).   Weight as of this encounter: 82 kg.    Nutritional Assessment: Body mass index is 29.18 kg/m. Seen by dietician.  I agree with the assessment and plan as outlined below: Nutrition Status:        . Skin Assessment: I have examined the patient's skin and I agree with the wound assessment as performed by the wound care RN as outlined below:    Consultants:  None   Procedures:  None  Antimicrobials:  Anti-infectives (From admission, onward)    None         Subjective: Patient seen and examined.  Still complains of dizziness even when she is laying in the bed.  She has no other complaint.  She is fully alert and oriented however I question her capacity to make decisions.  Patient continues to talk about a musical show that is scheduled to happen at her independent living facility today.  She really wants to get there at 6 PM and does not want to miss it.  She keeps repeating the same thing over and over again.  Objective: Vitals:   12/22/21  2010 12/22/21 2335 12/23/21 0435 12/23/21 0726  BP: 135/70 (!) 132/55 (!) 140/63 (!) 140/68  Pulse: 66 65 (!) 58 (!) 57  Resp: 20 18 18 14   Temp: 98.4 F (36.9 C) 99 F (37.2 C) 98.5 F (36.9 C) 98.9 F (37.2 C)  TempSrc: Oral Oral Oral Oral  SpO2: 97% 95% 96% 99%  Weight:      Height:        Intake/Output Summary (Last 24 hours) at 12/23/2021  0754 Last data filed at 12/23/2021 0223 Gross per 24 hour  Intake --  Output 1500 ml  Net -1500 ml    Filed Weights   12/19/21 0701 12/20/21 1743  Weight: 81.6 kg 82 kg    Examination:  General exam: Appears calm and comfortable  Respiratory system: Clear to auscultation. Respiratory effort normal. Cardiovascular system: S1 & S2 heard, RRR. No JVD, murmurs, rubs, gallops or clicks. No pedal edema. Gastrointestinal system: Abdomen is nondistended, soft and nontender. No organomegaly or masses felt. Normal bowel sounds heard. Central nervous system: Alert and oriented. No focal neurological deficits. Extremities: Symmetric 5 x 5 power. Skin: No rashes, lesions or ulcers Psychiatry: Judgement and insight appear poor  Data Reviewed: I have personally reviewed following labs and imaging studies  CBC: Recent Labs  Lab 12/19/21 0715 12/21/21 0432  WBC 7.7 4.8  HGB 14.4 11.8*  HCT 41.6 33.9*  MCV 93.3 93.6  PLT 236 159    Basic Metabolic Panel: Recent Labs  Lab 12/19/21 0715 12/21/21 0432  NA 142 140  K 3.8 3.2*  CL 109 111  CO2 24 19*  GLUCOSE 131* 82  BUN 11 8  CREATININE 0.87 0.69  CALCIUM 9.3 8.4*    GFR: Estimated Creatinine Clearance: 71.7 mL/min (by C-G formula based on SCr of 0.69 mg/dL). Liver Function Tests: Recent Labs  Lab 12/19/21 0715  AST 19  ALT 15  ALKPHOS 52  BILITOT 0.8  PROT 7.8  ALBUMIN 4.6    Recent Labs  Lab 12/19/21 1022 12/20/21 1543  LIPASE 67* 26    No results for input(s): "AMMONIA" in the last 168 hours. Coagulation Profile: No results for input(s): "INR", "PROTIME" in the last 168 hours. Cardiac Enzymes: No results for input(s): "CKTOTAL", "CKMB", "CKMBINDEX", "TROPONINI" in the last 168 hours. BNP (last 3 results) No results for input(s): "PROBNP" in the last 8760 hours. HbA1C: Recent Labs    12/20/21 1543  HGBA1C 5.2    CBG: Recent Labs  Lab 12/19/21 0654  GLUCAP 132*    Lipid Profile: Recent Labs     12/21/21 0432  CHOL 112  HDL 53  LDLCALC 53  TRIG 30  CHOLHDL 2.1    Thyroid Function Tests: Recent Labs    12/20/21 1543  TSH 1.089    Anemia Panel: No results for input(s): "VITAMINB12", "FOLATE", "FERRITIN", "TIBC", "IRON", "RETICCTPCT" in the last 72 hours. Sepsis Labs: No results for input(s): "PROCALCITON", "LATICACIDVEN" in the last 168 hours.  No results found for this or any previous visit (from the past 240 hour(s)).   Radiology Studies: CT CHEST WO CONTRAST  Result Date: 12/21/2021 CLINICAL DATA:  MRI clearance, assessing for metallic object in the chest. EXAM: CT CHEST WITHOUT CONTRAST TECHNIQUE: Multidetector CT imaging of the chest was performed following the standard protocol without IV contrast. RADIATION DOSE REDUCTION: This exam was performed according to the departmental dose-optimization program which includes automated exposure control, adjustment of the mA and/or kV according to patient size and/or use of iterative  reconstruction technique. COMPARISON:  December 21, 2021. FINDINGS: Cardiovascular: Calcified aortic atherosclerosis minimal calcified plaque in the thoracic aorta. Normal caliber of the thoracic aorta. Normal caliber of central pulmonary vessels. Normal heart size. Scattered coronary artery calcifications of LEFT coronary circulation. Mediastinum/Nodes: No thoracic inlet, axillary, mediastinal or hilar adenopathy. Esophagus grossly normal. Lungs/Pleura: Basilar atelectasis. No effusion. No consolidative changes. Airways are patent. Upper Abdomen: Incidental imaging of upper abdominal contents is unremarkable. Musculoskeletal: No acute bone finding. No destructive bone process. Spinal degenerative changes. On the scout image obtained for this exam the metallic structure that was seen on the previous radiograph is no longer visible. There are EKG leads over the chest. IMPRESSION: 1. No acute cardiopulmonary process. 2. Scattered coronary artery  calcifications of LEFT coronary circulation. 3. Aortic atherosclerosis. 4. Metallic foreign body or structure seen on previous radiograph is no longer visible on scout image obtained for CT and not seen on CT images. Aortic Atherosclerosis (ICD10-I70.0). Electronically Signed   By: Donzetta KohutGeoffrey  Wile M.D.   On: 12/21/2021 13:53   MR BRAIN WO CONTRAST  Result Date: 12/21/2021 CLINICAL DATA:  Transient ischemic attack. EXAM: MRI HEAD WITHOUT CONTRAST TECHNIQUE: Multiplanar, multiecho pulse sequences of the brain and surrounding structures were obtained without intravenous contrast. COMPARISON:  Head CT December 19, 2021. MRI of the brain July 11, 2021. FINDINGS: Brain: No acute infarction, hemorrhage, hydrocephalus, extra-axial collection or mass lesion. Small remote infarcts in the bilateral cerebellar hemispheres. Scattered and confluent foci of T2 hyperintensity are seen within the white matter of cerebral hemispheres, nonspecific, unchanged when compared to prior MRI. Mild parenchymal volume loss is also unchanged. Vascular: Normal flow voids. Skull and upper cervical spine: Normal marrow signal. Sinuses/Orbits: Negative. Other: None. IMPRESSION: 1. No acute intracranial abnormality. 2. Moderate chronic microvascular ischemic changes of the white matter, stable. 3. Small remote infarcts in the bilateral cerebellar hemispheres. Electronically Signed   By: Baldemar LenisKatyucia  de Macedo Rodrigues M.D.   On: 12/21/2021 13:49   ECHOCARDIOGRAM COMPLETE  Result Date: 12/21/2021    ECHOCARDIOGRAM REPORT   Patient Name:   Crisoforo OxfordCORNELIA LEA Demirjian Date of Exam: 12/21/2021 Medical Rec #:  [id number]            Height:       66.0 in Accession #:    [id number]           Weight:       180.8 lb Date of Birth:  [birth year]           BSA:          1.916 m Patient Age:    70 years             BP:           147/77 mmHg Patient Gender: F                    HR:           74 bpm. Exam Location:  Inpatient Procedure: 2D Echo and Intracardiac  Opacification Agent Indications:    TIA  History:        Patient has no prior history of Echocardiogram examinations.                 COPD, Signs/Symptoms:Dizziness/Lightheadedness; Risk                 Factors:Current Smoker.  Sonographer:    Cathie HoopsWendy Porter Referring Phys: [id number] ALLISON WOLFE  Sonographer Comments: Technically difficult study due to poor echo  windows, suboptimal parasternal window and patient is obese. Image acquisition challenging due to patient body habitus, Image acquisition challenging due to respiratory motion and supine. IMPRESSIONS  1. Left ventricular ejection fraction, by estimation, is 65 to 70%. The left ventricle has normal function. The left ventricle has no regional wall motion abnormalities. Left ventricular diastolic parameters are indeterminate.  2. Right ventricular systolic function is normal. The right ventricular size is normal.  3. The mitral valve is normal in structure. No evidence of mitral valve regurgitation. No evidence of mitral stenosis.  4. The aortic valve is normal in structure. There is mild calcification of the aortic valve. Aortic valve regurgitation is not visualized. Aortic valve sclerosis is present, with no evidence of aortic valve stenosis.  5. The inferior vena cava is normal in size with greater than 50% respiratory variability, suggesting right atrial pressure of 3 mmHg. FINDINGS  Left Ventricle: Left ventricular ejection fraction, by estimation, is 65 to 70%. The left ventricle has normal function. The left ventricle has no regional wall motion abnormalities. Definity contrast agent was given IV to delineate the left ventricular  endocardial borders. The left ventricular internal cavity size was normal in size. There is no left ventricular hypertrophy. Left ventricular diastolic parameters are indeterminate. Right Ventricle: The right ventricular size is normal. No increase in right ventricular wall thickness. Right ventricular systolic function is  normal. Left Atrium: Left atrial size was normal in size. Right Atrium: Right atrial size was normal in size. Pericardium: There is no evidence of pericardial effusion. Mitral Valve: The mitral valve is normal in structure. Mild mitral annular calcification. No evidence of mitral valve regurgitation. No evidence of mitral valve stenosis. Tricuspid Valve: The tricuspid valve is normal in structure. Tricuspid valve regurgitation is not demonstrated. No evidence of tricuspid stenosis. Aortic Valve: The aortic valve is normal in structure. There is mild calcification of the aortic valve. Aortic valve regurgitation is not visualized. Aortic valve sclerosis is present, with no evidence of aortic valve stenosis. Pulmonic Valve: The pulmonic valve was normal in structure. Pulmonic valve regurgitation is not visualized. No evidence of pulmonic stenosis. Aorta: The aortic root is normal in size and structure. Venous: The inferior vena cava is normal in size with greater than 50% respiratory variability, suggesting right atrial pressure of 3 mmHg. IAS/Shunts: No atrial level shunt detected by color flow Doppler.  LEFT VENTRICLE PLAX 2D LVIDd:         4.70 cm     Diastology LVIDs:         3.00 cm     LV e' medial:    7.30 cm/s LV PW:         0.90 cm     LV E/e' medial:  17.9 LV IVS:        0.90 cm     LV e' lateral:   9.64 cm/s LVOT diam:     1.90 cm     LV E/e' lateral: 13.6 LV SV:         64 LV SV Index:   33 LVOT Area:     2.84 cm  LV Volumes (MOD) LV vol d, MOD A2C: 67.1 ml LV vol d, MOD A4C: 74.7 ml LV vol s, MOD A2C: 27.3 ml LV vol s, MOD A4C: 23.8 ml LV SV MOD A2C:     39.8 ml LV SV MOD A4C:     74.7 ml LV SV MOD BP:      49.0 ml RIGHT VENTRICLE RV  S prime:     13.40 cm/s TAPSE (M-mode): 1.3 cm LEFT ATRIUM             Index        RIGHT ATRIUM          Index LA diam:        3.20 cm 1.67 cm/m   RA Area:     9.32 cm LA Vol (A2C):   21.9 ml 11.43 ml/m  RA Volume:   18.80 ml 9.81 ml/m LA Vol (A4C):   51.7 ml 26.99 ml/m  LA Biplane Vol: 36.4 ml 19.00 ml/m  AORTIC VALVE             PULMONIC VALVE LVOT Vmax:   110.00 cm/s PV Vmax:       1.15 m/s LVOT Vmean:  74.200 cm/s PV Peak grad:  5.3 mmHg LVOT VTI:    0.224 m  AORTA Ao Root diam: 3.70 cm MITRAL VALVE MV Area (PHT): 4.15 cm     SHUNTS MV Decel Time: 183 msec     Systemic VTI:  0.22 m MV E velocity: 131.00 cm/s  Systemic Diam: 1.90 cm MV A velocity: 132.00 cm/s MV E/A ratio:  0.99 Mihai Croitoru MD Electronically signed by Sanda Klein MD Signature Date/Time: 12/21/2021/10:51:39 AM    Final    DG CHEST PORT 1 VIEW  Result Date: 12/21/2021 CLINICAL DATA:  Metal screening for MRI. EXAM: PORTABLE CHEST 1 VIEW COMPARISON:  Chest CT 12/23/2020. FINDINGS: The lungs are clear without focal pneumonia, edema, pneumothorax or pleural effusion. The cardiopericardial silhouette is within normal limits for size. Radiopaque foreign body projects over the heart and may be metallic. Telemetry leads overlie the chest. IMPRESSION: Radiopaque foreign body projects over the heart, not present on prior CT from 12/23/2020. This may be metallic. Location of this foreign body cannot be determined on a single frontal projection. Consider chest CT without contrast to further evaluate as clinically warranted. Electronically Signed   By: Misty Stanley M.D.   On: 12/21/2021 09:24    Scheduled Meds:  aspirin EC  81 mg Oral Daily   PARoxetine  30 mg Oral Daily   traZODone  50 mg Oral QHS   triamterene-hydrochlorothiazide  0.5 tablet Oral Daily   Continuous Infusions:  sodium chloride 1,000 mL (12/21/21 0647)     LOS: 2 days   Little Ishikawa, MD [facility]  12/23/2021, 7:54 AM   *Please note that this is a verbal dictation therefore any spelling or grammatical errors are due to the "[place] One" system interpretation.  Please page via [place] and do not message via secure chat for urgent patient care matters. Secure chat can be used for non urgent patient care  matters.  How to contact the [facility] Attending or Consulting provider [place] or covering provider during after hours [place], for this patient?  Check the care team in [facility] and look for a) attending/consulting TRH provider listed and b) the [facility] team listed. Page or secure chat [phone]. Log into [email or web address] and use [place]'s universal password to access. If you do not have the password, please contact the hospital operator. Locate the [facility] provider you are looking for under [facility] and page to a number that you can be directly reached. If you still have difficulty reaching the provider, please page the [facility] (Director on Call) for the Hospitalists listed on amion for assistance.

## 2021-12-23 NOTE — TOC Progression Note (Signed)
Transition of Care [facility]) - Progression Note    Patient Details  Name: Shauniece Kwan MRN: [id number] Date of Birth: 1950/04/14  Transition of Care [facility]) CM/SW [place], [place] Phone Number: 12/23/2021, 12:05 PM  Clinical Narrative:   CSW spoke with daughter, [name], to discuss SNF choices. Patient and daughter would like to choose [facility]. CSW confirmed bed availability with [facility], and initiated insurance authorization. Awaiting insurance decision. CSW to follow.    Expected Discharge Plan: [place] Barriers to Discharge: Continued Medical Work up, Ship broker  Expected Discharge Plan and Services Expected Discharge Plan: [place] Choice: [place] arrangements for the past 2 months: [place]                                       Social Determinants of Health (SDOH) Interventions Housing Interventions: Intervention Not Indicated, Patient Refused  Readmission Risk Interventions     No data to display

## 2021-12-24 DIAGNOSIS — R42 Dizziness and giddiness: Secondary | ICD-10-CM | POA: Diagnosis not present

## 2021-12-24 NOTE — TOC Progression Note (Signed)
Transition of Care [facility]) - Progression Note    Patient Details  Name: Candice Velasquez MRN: [id number] Date of Birth: 1950-10-10  Transition of Care [facility]) CM/SW [place], [place] Phone Number: 12/24/2021, 4:14 PM  Clinical Narrative:   Insurance authorization still pending for patient to admit to [place]. CSW updated Ronney Lion and daughter, [name]. CSW to follow.    Expected Discharge Plan: [place] Barriers to Discharge: Continued Medical Work up, Ship broker  Expected Discharge Plan and Services Expected Discharge Plan: [place] Choice: [place] arrangements for the past 2 months: [place]                                       Social Determinants of Health (SDOH) Interventions Housing Interventions: Intervention Not Indicated, Patient Refused  Readmission Risk Interventions     No data to display

## 2021-12-24 NOTE — Progress Notes (Signed)
Mobility Specialist Progress Note   12/24/21 1030  Mobility  Activity Transferred from bed to chair  Activity Response Tolerated well  Distance Ambulated (ft) 3 ft  Level of Assistance Moderate assist, patient does 50-74%  Assistive Device Front wheel walker  Range of Motion/Exercises Active;All extremities   Patient received in supine requesting assistance to recliner chair. Required mod A to EOB from supine to sit and sit to stand. Upon standing, pt with posterior lean requiring mod A to steady. Was able to take steps to recliner chair but with heavy reliance on myself to prevent posterior lean. Tolerated without complaint or incident. Was left in recliner with all needs met, call bell in reach.   Candice Velasquez, [place], [place]  YQMVH:[phone] Office: [phone]

## 2021-12-24 NOTE — Progress Notes (Signed)
PROGRESS NOTE    Candice Velasquez  PJA:[id number] DOB: Oct 14, 1950 DOA: 12/19/2021 PCP: Deland Pretty, MD   Brief Narrative:  HPI: Candice Velasquez is a 71 y.o. female with medical history significant of anxiety and depression, HTN, dizziness/vertigo, hx of cerebellar lacunar infarct, memory impairment who presented to ED with complaints of dizziness and weakness. Poor historian - per family - due to hip pain, worsening tremor and dizziness they took her to ED. Her daughter states he was able to walk in to ED fine on her own prior to admission.   Seen by neurology in 10/2021 with complaints of dizziness at that time. Suspected largely vestibular involvement and undergoing vestibular rehab.  History of tremors.neurology note: 11/21:  Negative EMG studies. Doubted primary PD. ? Medication induced akathesia and tremors.  Patient awaiting discharge to SNF, pending bed availability and insurance approval patient is otherwise agreeable and stable for discharge  Assessment & Plan:   Principal Problem:   Dizziness Active Problems:   Elevated lipase   Anxiety and depression   Insomnia due to other mental disorder   Memory loss  Dizziness, cannot rule out vertigo/inner ear dysfunction(unspecified) Acute CVA ruled out - CT head negative.  MRI negative for acute findings (chronic microvascular changes/infarcts noted) - PT is also consulted for possible vestibular therapies.   - Meclizine PRN - PT recommending SNF placement   Elevated lipase: Nonspecific and insignificant.  Resolved.   Myalgias, unspecified -Patient reports worsening bilateral thigh/upper leg aches/cramping -Follow electrolytes - recommend increased ambulation/ROM as tolerated -Nsaids/heat/ice PRN  Memory loss Recently seen by neurology with further testing recommended  delirium precautions continued   Insomnia due to other mental disorder Hx of OSA, but not using cpap in past notes Continue trazodone prn    Essential hypertension: Resume triamterene and hydrochlorothiazide.   Anxiety and depression Has been forgetting morning meds and not taking consistently. Stopping paxil cold [place] could be contributing to some of her symptoms Discussed importance of taking prescribed medication  Hypokalemia: Replaced.  DVT prophylaxis: Place and maintain sequential compression device Start: 12/20/21 1541 Place TED hose Start: 12/20/21 1523   Code Status: Full Code  Family Communication:  Daughter at bedside.   Status is: Inpt The patient will require care spanning > 2 midnights and should be moved to inpatient because: Patient is still very dizzy.   Estimated body mass index is 29.18 kg/m as calculated from the following:   Height as of this encounter: 5\' 6"  (1.676 m).   Weight as of this encounter: 82 kg.    Nutritional Assessment: Body mass index is 29.18 kg/m.Marland Kitchen Seen by dietician.  I agree with the assessment and plan as outlined below: Nutrition Status:        . Skin Assessment: I have examined the patient's skin and I agree with the wound assessment as performed by the wound care RN as outlined below:    Consultants:  None   Procedures:  None  Antimicrobials:  Anti-infectives (From admission, onward)    None         Subjective: Patient seen and examined.  Still complains of dizziness even when she is laying in the bed.  She has no other complaint.  She is fully alert and oriented however I question her capacity to make decisions.  Patient continues to talk about a musical show that is scheduled to happen at her independent living facility today.  She really wants to get there at 6 PM and does not want  to miss it.  She keeps repeating the same thing over and over again.  Objective: Vitals:   12/23/21 1550 12/23/21 2012 12/23/21 2305 12/24/21 0329  BP: 123/68 127/69 113/67 (!) 154/73  Pulse: 65 72 70 62  Resp: 20 20 20 20   Temp: 99.1 F (37.3 C) 98.2 F (36.8 C)  98.4 F (36.9 C) 97.7 F (36.5 C)  TempSrc: Oral Oral Oral Oral  SpO2: 98% 98% 97% 98%  Weight:      Height:        Intake/Output Summary (Last 24 hours) at 12/24/2021 0733 Last data filed at 12/24/2021 0126 Gross per 24 hour  Intake 900 ml  Output 1450 ml  Net -550 ml    Filed Weights   12/19/21 0701 12/20/21 1743  Weight: 81.6 kg 82 kg    Examination:  General exam: Appears calm and comfortable  Respiratory system: Clear to auscultation. Respiratory effort normal. Cardiovascular system: S1 & S2 heard, RRR. No JVD, murmurs, rubs, gallops or clicks. No pedal edema. Gastrointestinal system: Abdomen is nondistended, soft and nontender. No organomegaly or masses felt. Normal bowel sounds heard. Central nervous system: Alert and oriented. No focal neurological deficits. Extremities: Symmetric 5 x 5 power. Skin: No rashes, lesions or ulcers Psychiatry: Judgement and insight appear poor  Data Reviewed: I have personally reviewed following labs and imaging studies  CBC: Recent Labs  Lab 12/19/21 0715 12/21/21 0432  WBC 7.7 4.8  HGB 14.4 11.8*  HCT 41.6 33.9*  MCV 93.3 93.6  PLT 236 [id number]    Basic Metabolic Panel: Recent Labs  Lab 12/19/21 0715 12/21/21 0432  NA 142 140  K 3.8 3.2*  CL 109 111  CO2 24 19*  GLUCOSE 131* 82  BUN 11 8  CREATININE 0.87 0.69  CALCIUM 9.3 8.4*    GFR: Estimated Creatinine Clearance: 71.7 mL/min (by C-G formula based on SCr of 0.69 mg/dL). Liver Function Tests: Recent Labs  Lab 12/19/21 0715  AST 19  ALT 15  ALKPHOS 52  BILITOT 0.8  PROT 7.8  ALBUMIN 4.6    Recent Labs  Lab 12/19/21 1022 12/20/21 1543  LIPASE 67* 26    No results for input(s): "AMMONIA" in the last 168 hours. Coagulation Profile: No results for input(s): "INR", "PROTIME" in the last 168 hours. Cardiac Enzymes: No results for input(s): "CKTOTAL", "CKMB", "CKMBINDEX", "TROPONINI" in the last 168 hours. BNP (last 3 results) No results for input(s):  "PROBNP" in the last 8760 hours. HbA1C: No results for input(s): "HGBA1C" in the last 72 hours.  CBG: Recent Labs  Lab 12/19/21 0654  GLUCAP 132*    Lipid Profile: No results for input(s): "CHOL", "HDL", "LDLCALC", "TRIG", "CHOLHDL", "LDLDIRECT" in the last 72 hours.  Thyroid Function Tests: No results for input(s): "TSH", "T4TOTAL", "FREET4", "T3FREE", "THYROIDAB" in the last 72 hours.  Anemia Panel: No results for input(s): "VITAMINB12", "FOLATE", "FERRITIN", "TIBC", "IRON", "RETICCTPCT" in the last 72 hours. Sepsis Labs: No results for input(s): "PROCALCITON", "LATICACIDVEN" in the last 168 hours.  No results found for this or any previous visit (from the past 240 hour(s)).   Radiology Studies: No results found.  Scheduled Meds:  aspirin EC  81 mg Oral Daily   PARoxetine  30 mg Oral Daily   traZODone  50 mg Oral QHS   triamterene-hydrochlorothiazide  0.5 tablet Oral Daily   Continuous Infusions:  sodium chloride 1,000 mL (12/21/21 0647)     LOS: 3 days   Little Ishikawa, MD [facility]  12/24/2021, 7:33 AM   *Please note that this is a verbal dictation therefore any spelling or grammatical errors are due to the "[place] One" system interpretation.  Please page via [place] and do not message via secure chat for urgent patient care matters. Secure chat can be used for non urgent patient care matters.  How to contact the [facility] Attending or Consulting provider [place] or covering provider during after hours [place], for this patient?  Check the care team in [facility] and look for a) attending/consulting TRH provider listed and b) the [facility] team listed. Page or secure chat [phone]. Log into [email or web address] and use [place]'s universal password to access. If you do not have the password, please contact the hospital operator. Locate the [facility] provider you are looking for under [facility] and page to a number that you can be directly reached. If you still have  difficulty reaching the provider, please page the [facility] (Director on Call) for the Hospitalists listed on amion for assistance.

## 2021-12-24 NOTE — Plan of Care (Signed)
  Problem: Education: Goal: Knowledge of General Education information will improve Description: Including pain rating scale, medication(s)/side effects and non-pharmacologic comfort measures Outcome: Progressing   Problem: Health Behavior/Discharge Planning: Goal: Ability to manage health-related needs will improve Outcome: Progressing   Problem: Clinical Measurements: Goal: Ability to maintain clinical measurements within normal limits will improve Outcome: Progressing Goal: Will remain free from infection Outcome: Progressing Goal: Diagnostic test results will improve Outcome: Progressing Goal: Respiratory complications will improve Outcome: Progressing Goal: Cardiovascular complication will be avoided Outcome: Progressing   Problem: Nutrition: Goal: Adequate nutrition will be maintained Outcome: Progressing   Problem: Coping: Goal: Level of anxiety will decrease Outcome: Progressing   Problem: Elimination: Goal: Will not experience complications related to bowel motility Outcome: Progressing Goal: Will not experience complications related to urinary retention Outcome: Progressing   Problem: Safety: Goal: Ability to remain free from injury will improve Outcome: Progressing   Problem: Skin Integrity: Goal: Risk for impaired skin integrity will decrease Outcome: Progressing   Problem: Education: Goal: Knowledge of disease or condition will improve Outcome: Progressing Goal: Knowledge of secondary prevention will improve (SELECT ALL) Outcome: Progressing Goal: Knowledge of patient specific risk factors will improve (INDIVIDUALIZE FOR PATIENT) Outcome: Progressing Goal: Individualized Educational Video(s) Outcome: Progressing   Problem: Health Behavior/Discharge Planning: Goal: Ability to manage health-related needs will improve Outcome: Progressing   Problem: Ischemic Stroke/TIA Tissue Perfusion: Goal: Complications of ischemic stroke/TIA will be  minimized Outcome: Progressing

## 2021-12-24 NOTE — Care Management Important Message (Signed)
Important Message  Patient Details  Name: Candice Velasquez MRN: [id number] Date of Birth: 1951-01-24   Medicare Important Message Given:  Yes     Orbie Pyo 12/24/2021, 2:57 PM

## 2021-12-24 NOTE — Progress Notes (Signed)
Mobility Specialist Progress Note   12/24/21 1500  Mobility  Activity Transferred from chair to bed  Activity Response Tolerated well  Level of Assistance Moderate assist, patient does 50-74%  Assistive Device Front wheel walker  Range of Motion/Exercises Active;All extremities   Patient received in recliner requesting assistance back to bed. Deferred ambulation second to fatigue. Required mod A to stand from recliner and pivot back to bed. Tolerated without complaint or incident. Was left in supine with all needs met, call bell in reach.   Candice Velasquez, [place], [place]  ENIHN:[phone] Office: [phone]

## 2021-12-25 DIAGNOSIS — R42 Dizziness and giddiness: Secondary | ICD-10-CM | POA: Diagnosis not present

## 2021-12-25 NOTE — Progress Notes (Signed)
PROGRESS NOTE    Candice Velasquez  PJA:[id number] DOB: Oct 14, 1950 DOA: 12/19/2021 PCP: Deland Pretty, MD   Brief Narrative:  HPI: Candice Velasquez is a 71 y.o. female with medical history significant of anxiety and depression, HTN, dizziness/vertigo, hx of cerebellar lacunar infarct, memory impairment who presented to ED with complaints of dizziness and weakness. Poor historian - per family - due to hip pain, worsening tremor and dizziness they took her to ED. Her daughter states he was able to walk in to ED fine on her own prior to admission.   Seen by neurology in 10/2021 with complaints of dizziness at that time. Suspected largely vestibular involvement and undergoing vestibular rehab.  History of tremors.neurology note: 11/21:  Negative EMG studies. Doubted primary PD. ? Medication induced akathesia and tremors.  Patient awaiting discharge to SNF, pending bed availability and insurance approval patient is otherwise agreeable and stable for discharge  Assessment & Plan:   Principal Problem:   Dizziness Active Problems:   Elevated lipase   Anxiety and depression   Insomnia due to other mental disorder   Memory loss  Dizziness, cannot rule out vertigo/inner ear dysfunction(unspecified) Acute CVA ruled out - CT head negative.  MRI negative for acute findings (chronic microvascular changes/infarcts noted) - PT is also consulted for possible vestibular therapies.   - Meclizine PRN - PT recommending SNF placement   Elevated lipase: Nonspecific and insignificant.  Resolved.   Myalgias, unspecified -Patient reports worsening bilateral thigh/upper leg aches/cramping -Follow electrolytes - recommend increased ambulation/ROM as tolerated -Nsaids/heat/ice PRN  Memory loss Recently seen by neurology with further testing recommended  delirium precautions continued   Insomnia due to other mental disorder Hx of OSA, but not using cpap in past notes Continue trazodone prn    Essential hypertension: Resume triamterene and hydrochlorothiazide.   Anxiety and depression Has been forgetting morning meds and not taking consistently. Stopping paxil cold [place] could be contributing to some of her symptoms Discussed importance of taking prescribed medication  Hypokalemia: Replaced.  DVT prophylaxis: Place and maintain sequential compression device Start: 12/20/21 1541 Place TED hose Start: 12/20/21 1523   Code Status: Full Code  Family Communication:  Daughter at bedside.   Status is: Inpt The patient will require care spanning > 2 midnights and should be moved to inpatient because: Patient is still very dizzy.   Estimated body mass index is 29.18 kg/m as calculated from the following:   Height as of this encounter: 5\' 6"  (1.676 m).   Weight as of this encounter: 82 kg.    Nutritional Assessment: Body mass index is 29.18 kg/m.Marland Kitchen Seen by dietician.  I agree with the assessment and plan as outlined below: Nutrition Status:        . Skin Assessment: I have examined the patient's skin and I agree with the wound assessment as performed by the wound care RN as outlined below:    Consultants:  None   Procedures:  None  Antimicrobials:  Anti-infectives (From admission, onward)    None         Subjective: Patient seen and examined.  Still complains of dizziness even when she is laying in the bed.  She has no other complaint.  She is fully alert and oriented however I question her capacity to make decisions.  Patient continues to talk about a musical show that is scheduled to happen at her independent living facility today.  She really wants to get there at 6 PM and does not want  to miss it.  She keeps repeating the same thing over and over again.  Objective: Vitals:   12/24/21 2000 12/24/21 2359 12/25/21 0350 12/25/21 0728  BP: 128/65 124/69 131/66 (!) 145/72  Pulse: 70 69 60 60  Resp: 18 20 18 17   Temp: 98.1 F (36.7 C) 98.2 F (36.8 C)  97.8 F (36.6 C) 98.3 F (36.8 C)  TempSrc: Oral Oral Oral Oral  SpO2: 98% 99% 95% 98%  Weight:      Height:        Intake/Output Summary (Last 24 hours) at 12/25/2021 0756 Last data filed at 12/24/2021 2200 Gross per 24 hour  Intake 240 ml  Output 300 ml  Net -60 ml    Filed Weights   12/19/21 0701 12/20/21 1743  Weight: 81.6 kg 82 kg    Examination:  General exam: Appears calm and comfortable  Respiratory system: Clear to auscultation. Respiratory effort normal. Cardiovascular system: S1 & S2 heard, RRR. No JVD, murmurs, rubs, gallops or clicks. No pedal edema. Gastrointestinal system: Abdomen is nondistended, soft and nontender. No organomegaly or masses felt. Normal bowel sounds heard. Central nervous system: Alert and oriented. No focal neurological deficits. Extremities: Symmetric 5 x 5 power. Skin: No rashes, lesions or ulcers Psychiatry: Judgement and insight appear poor  Data Reviewed: I have personally reviewed following labs and imaging studies  CBC: Recent Labs  Lab 12/19/21 0715 12/21/21 0432  WBC 7.7 4.8  HGB 14.4 11.8*  HCT 41.6 33.9*  MCV 93.3 93.6  PLT 236 829    Basic Metabolic Panel: Recent Labs  Lab 12/19/21 0715 12/21/21 0432  NA 142 140  K 3.8 3.2*  CL 109 111  CO2 24 19*  GLUCOSE 131* 82  BUN 11 8  CREATININE 0.87 0.69  CALCIUM 9.3 8.4*    GFR: Estimated Creatinine Clearance: 71.7 mL/min (by C-G formula based on SCr of 0.69 mg/dL). Liver Function Tests: Recent Labs  Lab 12/19/21 0715  AST 19  ALT 15  ALKPHOS 52  BILITOT 0.8  PROT 7.8  ALBUMIN 4.6    Recent Labs  Lab 12/19/21 1022 12/20/21 1543  LIPASE 67* 26    No results for input(s): "AMMONIA" in the last 168 hours. Coagulation Profile: No results for input(s): "INR", "PROTIME" in the last 168 hours. Cardiac Enzymes: No results for input(s): "CKTOTAL", "CKMB", "CKMBINDEX", "TROPONINI" in the last 168 hours. BNP (last 3 results) No results for input(s):  "PROBNP" in the last 8760 hours. HbA1C: No results for input(s): "HGBA1C" in the last 72 hours.  CBG: Recent Labs  Lab 12/19/21 0654  GLUCAP 132*    Lipid Profile: No results for input(s): "CHOL", "HDL", "LDLCALC", "TRIG", "CHOLHDL", "LDLDIRECT" in the last 72 hours.  Thyroid Function Tests: No results for input(s): "TSH", "T4TOTAL", "FREET4", "T3FREE", "THYROIDAB" in the last 72 hours.  Anemia Panel: No results for input(s): "VITAMINB12", "FOLATE", "FERRITIN", "TIBC", "IRON", "RETICCTPCT" in the last 72 hours. Sepsis Labs: No results for input(s): "PROCALCITON", "LATICACIDVEN" in the last 168 hours.  No results found for this or any previous visit (from the past 240 hour(s)).   Radiology Studies: No results found.  Scheduled Meds:  aspirin EC  81 mg Oral Daily   PARoxetine  30 mg Oral Daily   traZODone  50 mg Oral QHS   triamterene-hydrochlorothiazide  0.5 tablet Oral Daily   Continuous Infusions:  sodium chloride 1,000 mL (12/21/21 0647)     LOS: 4 days   Little Ishikawa, MD [facility]  12/25/2021, 7:56 AM   *Please note that this is a verbal dictation therefore any spelling or grammatical errors are due to the "[place] One" system interpretation.  Please page via [place] and do not message via secure chat for urgent patient care matters. Secure chat can be used for non urgent patient care matters.  How to contact the [facility] Attending or Consulting provider [place] or covering provider during after hours [place], for this patient?  Check the care team in [facility] and look for a) attending/consulting TRH provider listed and b) the [facility] team listed. Page or secure chat [phone]. Log into [email or web address] and use [place]'s universal password to access. If you do not have the password, please contact the hospital operator. Locate the [facility] provider you are looking for under [facility] and page to a number that you can be directly reached. If you still have  difficulty reaching the provider, please page the [facility] (Director on Call) for the Hospitalists listed on amion for assistance.

## 2021-12-25 NOTE — Progress Notes (Deleted)
PROGRESS NOTE    Candice Velasquez  PJA:[id number] DOB: Oct 14, 1950 DOA: 12/19/2021 PCP: Deland Pretty, MD   Brief Narrative:  HPI: Candice Velasquez is a 71 y.o. female with medical history significant of anxiety and depression, HTN, dizziness/vertigo, hx of cerebellar lacunar infarct, memory impairment who presented to ED with complaints of dizziness and weakness. Poor historian - per family - due to hip pain, worsening tremor and dizziness they took her to ED. Her daughter states he was able to walk in to ED fine on her own prior to admission.   Seen by neurology in 10/2021 with complaints of dizziness at that time. Suspected largely vestibular involvement and undergoing vestibular rehab.  History of tremors.neurology note: 11/21:  Negative EMG studies. Doubted primary PD. ? Medication induced akathesia and tremors.  Patient awaiting discharge to SNF, pending bed availability and insurance approval patient is otherwise agreeable and stable for discharge  Assessment & Plan:   Principal Problem:   Dizziness Active Problems:   Elevated lipase   Anxiety and depression   Insomnia due to other mental disorder   Memory loss  Dizziness, cannot rule out vertigo/inner ear dysfunction(unspecified) Acute CVA ruled out - CT head negative.  MRI negative for acute findings (chronic microvascular changes/infarcts noted) - PT is also consulted for possible vestibular therapies.   - Meclizine PRN - PT recommending SNF placement   Elevated lipase: Nonspecific and insignificant.  Resolved.   Myalgias, unspecified -Patient reports worsening bilateral thigh/upper leg aches/cramping -Follow electrolytes - recommend increased ambulation/ROM as tolerated -Nsaids/heat/ice PRN  Memory loss Recently seen by neurology with further testing recommended  delirium precautions continued   Insomnia due to other mental disorder Hx of OSA, but not using cpap in past notes Continue trazodone prn    Essential hypertension: Resume triamterene and hydrochlorothiazide.   Anxiety and depression Has been forgetting morning meds and not taking consistently. Stopping paxil cold [place] could be contributing to some of her symptoms Discussed importance of taking prescribed medication  Hypokalemia: Replaced.  DVT prophylaxis: Place and maintain sequential compression device Start: 12/20/21 1541 Place TED hose Start: 12/20/21 1523   Code Status: Full Code  Family Communication:  Daughter at bedside.   Status is: Inpt The patient will require care spanning > 2 midnights and should be moved to inpatient because: Patient is still very dizzy.   Estimated body mass index is 29.18 kg/m as calculated from the following:   Height as of this encounter: 5\' 6"  (1.676 m).   Weight as of this encounter: 82 kg.    Nutritional Assessment: Body mass index is 29.18 kg/m.Marland Kitchen Seen by dietician.  I agree with the assessment and plan as outlined below: Nutrition Status:        . Skin Assessment: I have examined the patient's skin and I agree with the wound assessment as performed by the wound care RN as outlined below:    Consultants:  None   Procedures:  None  Antimicrobials:  Anti-infectives (From admission, onward)    None         Subjective: Patient seen and examined.  Still complains of dizziness even when she is laying in the bed.  She has no other complaint.  She is fully alert and oriented however I question her capacity to make decisions.  Patient continues to talk about a musical show that is scheduled to happen at her independent living facility today.  She really wants to get there at 6 PM and does not want  to miss it.  She keeps repeating the same thing over and over again.  Objective: Vitals:   12/24/21 2359 12/25/21 0350 12/25/21 0728 12/25/21 1123  BP: 124/69 131/66 (!) 145/72 120/63  Pulse: 69 60 60 65  Resp: 20 18 17 20   Temp: 98.2 F (36.8 C) 97.8 F (36.6 C)  98.3 F (36.8 C) 98.6 F (37 C)  TempSrc: Oral Oral Oral Oral  SpO2: 99% 95% 98% 100%  Weight:      Height:        Intake/Output Summary (Last 24 hours) at 12/25/2021 1224 Last data filed at 12/25/2021 1000 Gross per 24 hour  Intake 240 ml  Output 200 ml  Net 40 ml    Filed Weights   12/19/21 0701 12/20/21 1743  Weight: 81.6 kg 82 kg    Examination:  General exam: Appears calm and comfortable  Respiratory system: Clear to auscultation. Respiratory effort normal. Cardiovascular system: S1 & S2 heard, RRR. No JVD, murmurs, rubs, gallops or clicks. No pedal edema. Gastrointestinal system: Abdomen is nondistended, soft and nontender. No organomegaly or masses felt. Normal bowel sounds heard. Central nervous system: Alert and oriented. No focal neurological deficits. Extremities: Symmetric 5 x 5 power. Skin: No rashes, lesions or ulcers Psychiatry: Judgement and insight appear poor  Data Reviewed: I have personally reviewed following labs and imaging studies  CBC: Recent Labs  Lab 12/19/21 0715 12/21/21 0432  WBC 7.7 4.8  HGB 14.4 11.8*  HCT 41.6 33.9*  MCV 93.3 93.6  PLT 236 159    Basic Metabolic Panel: Recent Labs  Lab 12/19/21 0715 12/21/21 0432  NA 142 140  K 3.8 3.2*  CL 109 111  CO2 24 19*  GLUCOSE 131* 82  BUN 11 8  CREATININE 0.87 0.69  CALCIUM 9.3 8.4*    GFR: Estimated Creatinine Clearance: 71.7 mL/min (by C-G formula based on SCr of 0.69 mg/dL). Liver Function Tests: Recent Labs  Lab 12/19/21 0715  AST 19  ALT 15  ALKPHOS 52  BILITOT 0.8  PROT 7.8  ALBUMIN 4.6    Recent Labs  Lab 12/19/21 1022 12/20/21 1543  LIPASE 67* 26    No results for input(s): "AMMONIA" in the last 168 hours. Coagulation Profile: No results for input(s): "INR", "PROTIME" in the last 168 hours. Cardiac Enzymes: No results for input(s): "CKTOTAL", "CKMB", "CKMBINDEX", "TROPONINI" in the last 168 hours. BNP (last 3 results) No results for input(s):  "PROBNP" in the last 8760 hours. HbA1C: No results for input(s): "HGBA1C" in the last 72 hours.  CBG: Recent Labs  Lab 12/19/21 0654  GLUCAP 132*    Lipid Profile: No results for input(s): "CHOL", "HDL", "LDLCALC", "TRIG", "CHOLHDL", "LDLDIRECT" in the last 72 hours.  Thyroid Function Tests: No results for input(s): "TSH", "T4TOTAL", "FREET4", "T3FREE", "THYROIDAB" in the last 72 hours.  Anemia Panel: No results for input(s): "VITAMINB12", "FOLATE", "FERRITIN", "TIBC", "IRON", "RETICCTPCT" in the last 72 hours. Sepsis Labs: No results for input(s): "PROCALCITON", "LATICACIDVEN" in the last 168 hours.  No results found for this or any previous visit (from the past 240 hour(s)).   Radiology Studies: No results found.  Scheduled Meds:  aspirin EC  81 mg Oral Daily   PARoxetine  30 mg Oral Daily   traZODone  50 mg Oral QHS   triamterene-hydrochlorothiazide  0.5 tablet Oral Daily   Continuous Infusions:  sodium chloride 1,000 mL (12/21/21 0647)     LOS: 4 days   02/20/22, MD [facility]  12/25/2021, 12:24 PM   *Please note that this is a verbal dictation therefore any spelling or grammatical errors are due to the "[employer] One" system interpretation.  Please page via Amion and do not message via secure chat for urgent patient care matters. Secure chat can be used for non urgent patient care matters.  How to contact the [facility] Attending or Consulting provider 7A - 7P or covering provider during after hours 7P -7A, for this patient?  Check the care team in [facility] and look for a) attending/consulting TRH provider listed and b) the [facility] team listed. Page or secure chat 7A-7P. Log into [email or web address] and use [place]'s universal password to access. If you do not have the password, please contact the hospital operator. Locate the [facility] provider you are looking for under [facility] and page to a number that you can be directly reached. If you still have  difficulty reaching the provider, please page the [facility] (Director on Call) for the Hospitalists listed on amion for assistance.

## 2021-12-26 DIAGNOSIS — R42 Dizziness and giddiness: Secondary | ICD-10-CM | POA: Diagnosis not present

## 2021-12-26 NOTE — Progress Notes (Signed)
PROGRESS NOTE    Candice Velasquez  PJA:[id number] DOB: Oct 14, 1950 DOA: 12/19/2021 PCP: Deland Pretty, MD   Brief Narrative:  HPI: Candice Velasquez is a 71 y.o. female with medical history significant of anxiety and depression, HTN, dizziness/vertigo, hx of cerebellar lacunar infarct, memory impairment who presented to ED with complaints of dizziness and weakness. Poor historian - per family - due to hip pain, worsening tremor and dizziness they took her to ED. Her daughter states he was able to walk in to ED fine on her own prior to admission.   Seen by neurology in 10/2021 with complaints of dizziness at that time. Suspected largely vestibular involvement and undergoing vestibular rehab.  History of tremors.neurology note: 11/21:  Negative EMG studies. Doubted primary PD. ? Medication induced akathesia and tremors.  Patient awaiting discharge to SNF, pending bed availability and insurance approval patient is otherwise agreeable and stable for discharge  Assessment & Plan:   Principal Problem:   Dizziness Active Problems:   Elevated lipase   Anxiety and depression   Insomnia due to other mental disorder   Memory loss  Dizziness, cannot rule out vertigo/inner ear dysfunction(unspecified) Acute CVA ruled out - CT head negative.  MRI negative for acute findings (chronic microvascular changes/infarcts noted) - PT is also consulted for possible vestibular therapies.   - Meclizine PRN - PT recommending SNF placement   Elevated lipase: Nonspecific and insignificant.  Resolved.   Myalgias, unspecified -Patient reports worsening bilateral thigh/upper leg aches/cramping -Follow electrolytes - recommend increased ambulation/ROM as tolerated -Nsaids/heat/ice PRN  Memory loss Recently seen by neurology with further testing recommended  delirium precautions continued   Insomnia due to other mental disorder Hx of OSA, but not using cpap in past notes Continue trazodone prn    Essential hypertension: Resume triamterene and hydrochlorothiazide.   Anxiety and depression Has been forgetting morning meds and not taking consistently. Stopping paxil cold [place] could be contributing to some of her symptoms Discussed importance of taking prescribed medication  Hypokalemia: Replaced.  DVT prophylaxis: Place and maintain sequential compression device Start: 12/20/21 1541 Place TED hose Start: 12/20/21 1523   Code Status: Full Code  Family Communication:  Daughter at bedside.   Status is: Inpt The patient will require care spanning > 2 midnights and should be moved to inpatient because: Patient is still very dizzy.   Estimated body mass index is 29.18 kg/m as calculated from the following:   Height as of this encounter: 5\' 6"  (1.676 m).   Weight as of this encounter: 82 kg.    Nutritional Assessment: Body mass index is 29.18 kg/m.Marland Kitchen Seen by dietician.  I agree with the assessment and plan as outlined below: Nutrition Status:        . Skin Assessment: I have examined the patient's skin and I agree with the wound assessment as performed by the wound care RN as outlined below:    Consultants:  None   Procedures:  None  Antimicrobials:  Anti-infectives (From admission, onward)    None         Subjective: Patient seen and examined.  Still complains of dizziness even when she is laying in the bed.  She has no other complaint.  She is fully alert and oriented however I question her capacity to make decisions.  Patient continues to talk about a musical show that is scheduled to happen at her independent living facility today.  She really wants to get there at 6 PM and does not want  to miss it.  She keeps repeating the same thing over and over again.  Objective: Vitals:   12/25/21 1618 12/25/21 2024 12/25/21 2357 12/26/21 0353  BP: 122/81 120/68 113/62 116/65  Pulse: 85 71 68 66  Resp: 17 19 17 18   Temp: 98.1 F (36.7 C) 98.5 F (36.9 C) 98.1  F (36.7 C) 98 F (36.7 C)  TempSrc: Oral Oral Oral Oral  SpO2: 95% 98% 97% 94%  Weight:      Height:        Intake/Output Summary (Last 24 hours) at 12/26/2021 0741 Last data filed at 12/26/2021 0600 Gross per 24 hour  Intake 119 ml  Output 1150 ml  Net -1031 ml    Filed Weights   12/19/21 0701 12/20/21 1743  Weight: 81.6 kg 82 kg    Examination:  General exam: Appears calm and comfortable  Respiratory system: Clear to auscultation. Respiratory effort normal. Cardiovascular system: S1 & S2 heard, RRR. No JVD, murmurs, rubs, gallops or clicks. No pedal edema. Gastrointestinal system: Abdomen is nondistended, soft and nontender. No organomegaly or masses felt. Normal bowel sounds heard. Central nervous system: Alert and oriented. No focal neurological deficits. Extremities: Symmetric 5 x 5 power. Skin: No rashes, lesions or ulcers Psychiatry: Judgement and insight appear poor  Data Reviewed: I have personally reviewed following labs and imaging studies  CBC: Recent Labs  Lab 12/21/21 0432  WBC 4.8  HGB 11.8*  HCT 33.9*  MCV 93.6  PLT 284    Basic Metabolic Panel: Recent Labs  Lab 12/21/21 0432  NA 140  K 3.2*  CL 111  CO2 19*  GLUCOSE 82  BUN 8  CREATININE 0.69  CALCIUM 8.4*    GFR: Estimated Creatinine Clearance: 71.7 mL/min (by C-G formula based on SCr of 0.69 mg/dL). Liver Function Tests: No results for input(s): "AST", "ALT", "ALKPHOS", "BILITOT", "PROT", "ALBUMIN" in the last 168 hours.  Recent Labs  Lab 12/19/21 1022 12/20/21 1543  LIPASE 67* 26    No results for input(s): "AMMONIA" in the last 168 hours. Coagulation Profile: No results for input(s): "INR", "PROTIME" in the last 168 hours. Cardiac Enzymes: No results for input(s): "CKTOTAL", "CKMB", "CKMBINDEX", "TROPONINI" in the last 168 hours. BNP (last 3 results) No results for input(s): "PROBNP" in the last 8760 hours. HbA1C: No results for input(s): "HGBA1C" in the last 72  hours.  CBG: No results for input(s): "GLUCAP" in the last 168 hours.  Lipid Profile: No results for input(s): "CHOL", "HDL", "LDLCALC", "TRIG", "CHOLHDL", "LDLDIRECT" in the last 72 hours.  Thyroid Function Tests: No results for input(s): "TSH", "T4TOTAL", "FREET4", "T3FREE", "THYROIDAB" in the last 72 hours.  Anemia Panel: No results for input(s): "VITAMINB12", "FOLATE", "FERRITIN", "TIBC", "IRON", "RETICCTPCT" in the last 72 hours. Sepsis Labs: No results for input(s): "PROCALCITON", "LATICACIDVEN" in the last 168 hours.  No results found for this or any previous visit (from the past 240 hour(s)).   Radiology Studies: No results found.  Scheduled Meds:  aspirin EC  81 mg Oral Daily   PARoxetine  30 mg Oral Daily   traZODone  50 mg Oral QHS   triamterene-hydrochlorothiazide  0.5 tablet Oral Daily   Continuous Infusions:  sodium chloride 1,000 mL (12/21/21 0647)     LOS: 5 days   Little Ishikawa, MD [facility]  12/26/2021, 7:41 AM   *Please note that this is a verbal dictation therefore any spelling or grammatical errors are due to the "[employer]" system interpretation.  Please  page via Amion and do not message via secure chat for urgent patient care matters. Secure chat can be used for non urgent patient care matters.  How to contact the [facility] Attending or Consulting provider 7A - 7P or covering provider during after hours 7P -7A, for this patient?  Check the care team in [facility] and look for a) attending/consulting TRH provider listed and b) the [facility] team listed. Page or secure chat 7A-7P. Log into [email or web address] and use [place]'s universal password to access. If you do not have the password, please contact the hospital operator. Locate the [facility] provider you are looking for under [facility] and page to a number that you can be directly reached. If you still have difficulty reaching the provider, please page the [facility] (Director on Call) for the  Hospitalists listed on amion for assistance.

## 2021-12-26 NOTE — Progress Notes (Signed)
Mobility Specialist Progress Note   12/26/21 1620  Mobility  Activity Ambulated with assistance in room  Activity Response Tolerated well  Distance Ambulated (ft) 24 ft (12+12)  $Mobility charge 1 Mobility  Level of Assistance Moderate assist, patient does 50-74%  Assistive Device Front wheel walker   Received pt in bed having no complaints and motivated to mobilize. Heavy modA throughout ambulation d/t posterior lean but performing better when cued on posture. X1 seated rest break d/t fatigue but able to return back to bed w/o fault. Left in bed w/ call bell in reach, needs met and bed alarm on.   Holland Falling Mobility Specialist [place] #:  [phone] Acute Rehab Office:  [phone]'

## 2021-12-27 DIAGNOSIS — R42 Dizziness and giddiness: Secondary | ICD-10-CM | POA: Diagnosis not present

## 2021-12-27 MED ORDER — MECLIZINE HCL 12.5 MG PO TABS: 12.5000 mg | ORAL_TABLET | Freq: Three times a day (TID) | ORAL | 0 refills | Status: AC | PRN

## 2021-12-27 MED ORDER — ASPIRIN 81 MG PO TBEC: 81.0000 mg | DELAYED_RELEASE_TABLET | Freq: Every day | ORAL | 12 refills | Status: AC

## 2021-12-27 NOTE — Progress Notes (Signed)
Patient discharged out via [facility] transport via stretcher; all belongings returned. Cell phone with patient.

## 2021-12-27 NOTE — Progress Notes (Signed)
Report called to [facility]; Candice Velasquez

## 2021-12-27 NOTE — Progress Notes (Signed)
Physical Therapy Treatment Patient Details Name: Candice Velasquez MRN: [id number] DOB: 12-18-1950 Today's Date: 12/27/2021   History of Present Illness Pt is a 71 y/o female presenting on 12/19/21 with dizziness and generalized weakness. PMH includes: anxiety, depression, HTN, dizziness/vertigo (was seeing vestibular PT), CVA (cerebellar lacunar infarct), memory impairment.    PT Comments    Pt continues to have posterior bias in sitting and standing require modA to achieve midline and maintain. Pt initially with bilat foot PF ambulating on balls of feet however s/p 10 feet of ambulation was able to achieve bilat feet flat. Pt continues to require modA for all transfers and ambulation. Continue to recommend SNF Upon d/c. Acute PT to cont to follow.    Recommendations for follow up therapy are one component of a multi-disciplinary discharge planning process, led by the attending physician.  Recommendations may be updated based on patient status, additional functional criteria and insurance authorization.  Follow Up Recommendations  Skilled nursing-short term rehab (<3 hours/day) Can patient physically be transported by private vehicle: No   Assistance Recommended at Discharge Frequent or constant Supervision/Assistance  Patient can return home with the following Two people to help with walking and/or transfers;Assistance with cooking/housework;Assist for transportation;Help with stairs or ramp for entrance   Equipment Recommendations  Other (comment) (TBD by next venue of care)    Recommendations for Other Services       Precautions / Restrictions Precautions Precautions: Fall Restrictions Weight Bearing Restrictions: No     Mobility  Bed Mobility Overal bed mobility: Needs Assistance Bed Mobility: Supine to Sit     Supine to sit: Mod assist     General bed mobility comments: HOB elevated, maxA directional verbal cues to complete task, modA for trunk elevation and to  scoot to EOB    Transfers Overall transfer level: Needs assistance Equipment used: Rolling walker (2 wheels) Transfers: Sit to/from Stand, Bed to chair/wheelchair/BSC Sit to Stand: Mod assist   Step pivot transfers: Mod assist, +2 safety/equipment       General transfer comment: modAx1 to power up, pt with posterior bias requiring posterior tactile cues to transition weight forward, pt on balls of feet unable to achieve feet flat, completed 2 step pvts to/from BSC/BED., attempted bilat ankle pumps and passive DF at EOB to promote DF    Ambulation/Gait Ambulation/Gait assistance: Mod assist, +2 safety/equipment (2nd person for chair follow) Gait Distance (Feet): 20 Feet Assistive device: Rolling walker (2 wheels) Gait Pattern/deviations: Step-to pattern, Decreased stride length, Leaning posteriorly, Decreased dorsiflexion - right, Decreased dorsiflexion - left Gait velocity: dec Gait velocity interpretation: <1.8 ft/sec, indicate of risk for recurrent falls   General Gait Details: pt with bilat feet in PF walking more on balls of feet, pt very dependent on UEs with posterior bias. PT to have to hold RW down to stay on floor and provide tactile cues for anterior weight shift. pt able to achieve R foot flat within 5 feet, L foot able to achieve neutral DF s/p 12 feet of ambulation, last 8' of ambulation pt with more fluid gait pattern and bilat feet flat   Stairs             Wheelchair Mobility    Modified Rankin (Stroke Patients Only)       Balance Overall balance assessment: Needs assistance Sitting-balance support: Single extremity supported Sitting balance-Leahy Scale: Fair Sitting balance - Comments: Difficulty holding midline balance without UE support   Standing balance support: During functional activity, Bilateral upper  extremity supported, Reliant on assistive device for balance Standing balance-Leahy Scale: Poor Standing balance comment: Reliant on UE support  and +2 assist, posterior lean preference                            Cognition Arousal/Alertness: Awake/alert Behavior During Therapy: WFL for tasks assessed/performed Overall Cognitive Status: Impaired/Different from baseline Area of Impairment: Safety/judgement, Awareness, Problem solving                       Following Commands: Follows one step commands consistently, Follows one step commands with increased time, Follows multi-step commands inconsistently Safety/Judgement: Decreased awareness of safety, Decreased awareness of deficits (poor body spatial awareness) Awareness: Emergent Problem Solving: Slow processing, Difficulty sequencing, Requires verbal cues, Decreased initiation General Comments: pt more timid today regarding transfers and ambulation        Exercises      General Comments General comments (skin integrity, edema, etc.): VSS, assist to [facility], unable to void, dependent to pull down/up underwear      Pertinent Vitals/Pain Pain Assessment Pain Assessment: 0-10 Pain Score: 2  Pain Location: bilat thighs Pain Descriptors / Indicators: Dull Pain Intervention(s): Monitored during session    Home Living                          Prior Function            PT Goals (current goals can now be found in the care plan section) Acute Rehab PT Goals Patient Stated Goal: Get back to her independent living facility PT Goal Formulation: With patient Time For Goal Achievement: 12/28/21 Potential to Achieve Goals: Good Progress towards PT goals: Progressing toward goals    Frequency    Min 3X/week      PT Plan Current plan remains appropriate    Co-evaluation              AM-PAC PT "6 Clicks" Mobility   Outcome Measure  Help needed turning from your back to your side while in a flat bed without using bedrails?: A Lot Help needed moving from lying on your back to sitting on the side of a flat bed without using bedrails?: A  Lot Help needed moving to and from a bed to a chair (including a wheelchair)?: A Lot Help needed standing up from a chair using your arms (e.g., wheelchair or bedside chair)?: A Lot Help needed to walk in hospital room?: Total Help needed climbing 3-5 steps with a railing? : Total 6 Click Score: 10    End of Session Equipment Utilized During Treatment: Gait belt Activity Tolerance: Patient tolerated treatment well Patient left: in chair;with call bell/phone within reach;with chair alarm set Nurse Communication: Mobility status PT Visit Diagnosis: Unsteadiness on feet (R26.81);Pain Pain - part of body: Knee     Time: 0826-0851 PT Time Calculation (min) (ACUTE ONLY): 25 min  Charges:  $Gait Training: 8-22 mins $Therapeutic Activity: 8-22 mins                     Kittie Plater, PT, DPT Acute Rehabilitation Services Secure chat preferred Office #: [phone]    Berline Lopes 12/27/2021, 10:51 AM

## 2021-12-27 NOTE — TOC Transition Note (Signed)
Transition of Care [facility]) - CM/SW Discharge Note   Patient Details  Name: Candice Velasquez MRN: [id number] Date of Birth: 03-15-1951  Transition of Care [facility]) CM/SW Contact:  Pollie Friar, RN Phone Number: 12/27/2021, 12:06 PM   Clinical Narrative:    Pt is discharging to [facility] today. She will transport via [place]. D/c packet at the desk and bedside RN updated.   Number for report: [phone]   Final next level of care: Skilled Nursing Facility Barriers to Discharge: No Barriers Identified   Patient Goals and CMS Choice Patient states their goals for this hospitalization and ongoing recovery are:: patient unable to participate in goal setting, not fully oriented CMS Medicare.gov Compare Post Acute Care list provided to:: Patient Represenative (must comment) Choice offered to / list presented to : Adult Children  Discharge Placement              Patient chooses bed at: [facility] Patient to be transferred to facility by: [facility] Name of family member notified: daughter Patient and family notified of of transfer: 12/27/21  Discharge Plan and Services     Post Acute Care Choice: [place]                               Social Determinants of Health (SDOH) Interventions Housing Interventions: Intervention Not Indicated, Patient Refused   Readmission Risk Interventions     No data to display

## 2021-12-27 NOTE — Discharge Summary (Signed)
Physician Discharge Summary  Candice Katherine RoanLea Mormino ZOX:[id number] DOB: April 02, 1950 DOA: 12/19/2021  PCP: Merri BrunettePharr, Walter, MD  Admit date: 12/19/2021 Discharge date: 12/27/2021  Admitted From: Home Disposition: SNF  Recommendations for Outpatient Follow-up:  Follow up with PCP in 1-2 weeks  Discharge Condition: Stable CODE STATUS: Full Diet recommendation: Low salt low-fat diet  Brief/Interim Summary: Candice Velasquez is a 71 y.o. female with medical history significant of anxiety and depression, HTN, dizziness/vertigo, hx of cerebellar lacunar infarct, memory impairment who presented to ED with complaints of dizziness and weakness. Poor historian - per family - due to hip pain, worsening tremor and dizziness they took her to ED. Her daughter states he was able to walk in to ED fine on her own prior to admission.   Seen by neurology in 10/2021 with complaints of dizziness at that time. Suspected largely vestibular involvement and undergoing vestibular rehab.  History of tremors.neurology note: 11/21:  Negative EMG studies. Doubted primary PD. ? Medication induced akathesia and tremors.   Patient awaiting discharge to SNF, pending bed availability and insurance approval patient is otherwise agreeable and stable for discharge  Discharge Diagnoses:  Principal Problem:   Dizziness Active Problems:   Elevated lipase   Anxiety and depression   Insomnia due to other mental disorder   Memory loss  Dizziness, cannot rule out vertigo/unspecified vestibular dysfunction Acute CVA ruled out - CT head negative.  MRI negative for acute findings (chronic microvascular changes/infarcts noted) - PT is also consulted for possible vestibular therapies.   - Meclizine PRN - PT recommending SNF placement   Elevated lipase: Nonspecific and insignificant.  Resolved.   Myalgias, unspecified -Patient reports worsening bilateral thigh/upper leg aches/cramping -Follow electrolytes - recommend increased  ambulation/ROM as tolerated -Nsaids/heat/ice PRN   Memory loss Recently seen by neurology with further testing recommended  delirium precautions continued   Insomnia due to other mental disorder Hx of OSA, but not using cpap in past notes Continue trazodone prn    Essential hypertension: Resume triamterene and hydrochlorothiazide.   Anxiety and depression Has been forgetting morning meds and not taking consistently. Stopping paxil cold [place] could be contributing to some of her symptoms Discussed importance of taking prescribed medication   Hypokalemia: Replaced.  Discharge Instructions  Discharge Instructions     Discharge patient   Complete by: As directed    Discharge disposition: 03-Skilled Nursing Facility   Discharge patient date: 12/27/2021      Allergies as of 12/27/2021       Reactions   Lunesta [eszopiclone] Other (See Comments)   muscle pain, foul taste in mouth   Voltaren [diclofenac] Nausea And Vomiting        Medication List     STOP taking these medications    lidocaine 5 % Commonly known as: Lidoderm   LORazepam 1 MG tablet Commonly known as: ATIVAN   meloxicam 15 MG tablet Commonly known as: MOBIC       TAKE these medications    acetaminophen 500 MG tablet Commonly known as: TYLENOL Take 500 mg by mouth every 4 (four) hours as needed for mild pain or moderate pain.   aspirin EC 81 MG tablet Take 1 tablet (81 mg total) by mouth daily. Swallow whole.   ibuprofen 800 MG tablet Commonly known as: ADVIL Take 1 tablet (800 mg total) by mouth every 8 (eight) hours as needed. What changed: reasons to take this   meclizine 12.5 MG tablet Commonly known as: ANTIVERT Take 1 tablet (12.5 mg  total) by mouth 3 (three) times daily as needed for dizziness.   multivitamin capsule Take 2 capsules by mouth daily. [place]   PARoxetine 30 MG tablet Commonly known as: PAXIL Take 1 tablet (30 mg total) by mouth daily.   PROBIOTIC DAILY  PO Take 1-2 capsules by mouth daily.   solifenacin 10 MG tablet Commonly known as: VESICARE Take 10 mg by mouth at bedtime.   traZODone 50 MG tablet Commonly known as: DESYREL Take 1-2 tablets (50-100 mg total) by mouth at bedtime. What changed: how much to take   triamterene-hydrochlorothiazide 37.5-25 MG tablet Commonly known as: MAXZIDE-25 Take 0.5 tablets by mouth daily.   Vitamin D3 125 MCG (5000 UT) Caps Take 1 capsule by mouth at bedtime.        Allergies  Allergen Reactions   Lunesta [Eszopiclone] Other (See Comments)    muscle pain, foul taste in mouth   Voltaren [Diclofenac] Nausea And Vomiting    Consultations: Neurology  Procedures/Studies: CT CHEST WO CONTRAST  Result Date: 12/21/2021 CLINICAL DATA:  MRI clearance, assessing for metallic object in the chest. EXAM: CT CHEST WITHOUT CONTRAST TECHNIQUE: Multidetector CT imaging of the chest was performed following the standard protocol without IV contrast. RADIATION DOSE REDUCTION: This exam was performed according to the departmental dose-optimization program which includes automated exposure control, adjustment of the mA and/or kV according to patient size and/or use of iterative reconstruction technique. COMPARISON:  December 21, 2021. FINDINGS: Cardiovascular: Calcified aortic atherosclerosis minimal calcified plaque in the thoracic aorta. Normal caliber of the thoracic aorta. Normal caliber of central pulmonary vessels. Normal heart size. Scattered coronary artery calcifications of LEFT coronary circulation. Mediastinum/Nodes: No thoracic inlet, axillary, mediastinal or hilar adenopathy. Esophagus grossly normal. Lungs/Pleura: Basilar atelectasis. No effusion. No consolidative changes. Airways are patent. Upper Abdomen: Incidental imaging of upper abdominal contents is unremarkable. Musculoskeletal: No acute bone finding. No destructive bone process. Spinal degenerative changes. On the scout image obtained for this  exam the metallic structure that was seen on the previous radiograph is no longer visible. There are EKG leads over the chest. IMPRESSION: 1. No acute cardiopulmonary process. 2. Scattered coronary artery calcifications of LEFT coronary circulation. 3. Aortic atherosclerosis. 4. Metallic foreign body or structure seen on previous radiograph is no longer visible on scout image obtained for CT and not seen on CT images. Aortic Atherosclerosis (ICD10-I70.0). Electronically Signed   By: Zetta Bills M.D.   On: 12/21/2021 13:53   MR BRAIN WO CONTRAST  Result Date: 12/21/2021 CLINICAL DATA:  Transient ischemic attack. EXAM: MRI HEAD WITHOUT CONTRAST TECHNIQUE: Multiplanar, multiecho pulse sequences of the brain and surrounding structures were obtained without intravenous contrast. COMPARISON:  Head CT December 19, 2021. MRI of the brain July 11, 2021. FINDINGS: Brain: No acute infarction, hemorrhage, hydrocephalus, extra-axial collection or mass lesion. Small remote infarcts in the bilateral cerebellar hemispheres. Scattered and confluent foci of T2 hyperintensity are seen within the white matter of cerebral hemispheres, nonspecific, unchanged when compared to prior MRI. Mild parenchymal volume loss is also unchanged. Vascular: Normal flow voids. Skull and upper cervical spine: Normal marrow signal. Sinuses/Orbits: Negative. Other: None. IMPRESSION: 1. No acute intracranial abnormality. 2. Moderate chronic microvascular ischemic changes of the white matter, stable. 3. Small remote infarcts in the bilateral cerebellar hemispheres. Electronically Signed   By: Pedro Earls M.D.   On: 12/21/2021 13:49   ECHOCARDIOGRAM COMPLETE  Result Date: 12/21/2021    ECHOCARDIOGRAM REPORT   Patient Name:   Candice LEA  Velasquez Date of Exam: 12/21/2021 Medical Rec #:  [id number]            Height:       66.0 in Accession #:    [id number]           Weight:       180.8 lb Date of Birth:  June 08, 1950           BSA:           1.916 m Patient Age:    70 years             BP:           147/77 mmHg Patient Gender: F                    HR:           74 bpm. Exam Location:  Inpatient Procedure: 2D Echo and Intracardiac Opacification Agent Indications:    TIA  History:        Patient has no prior history of Echocardiogram examinations.                 COPD, Signs/Symptoms:Dizziness/Lightheadedness; Risk                 Factors:Current Smoker.  Sonographer:    Cathie Hoops Referring Phys: [id number] [facility]  Sonographer Comments: Technically difficult study due to poor echo windows, suboptimal parasternal window and patient is obese. Image acquisition challenging due to patient body habitus, Image acquisition challenging due to respiratory motion and supine. IMPRESSIONS  1. Left ventricular ejection fraction, by estimation, is 65 to 70%. The left ventricle has normal function. The left ventricle has no regional wall motion abnormalities. Left ventricular diastolic parameters are indeterminate.  2. Right ventricular systolic function is normal. The right ventricular size is normal.  3. The mitral valve is normal in structure. No evidence of mitral valve regurgitation. No evidence of mitral stenosis.  4. The aortic valve is normal in structure. There is mild calcification of the aortic valve. Aortic valve regurgitation is not visualized. Aortic valve sclerosis is present, with no evidence of aortic valve stenosis.  5. The inferior vena cava is normal in size with greater than 50% respiratory variability, suggesting right atrial pressure of 3 mmHg. FINDINGS  Left Ventricle: Left ventricular ejection fraction, by estimation, is 65 to 70%. The left ventricle has normal function. The left ventricle has no regional wall motion abnormalities. Definity contrast agent was given IV to delineate the left ventricular  endocardial borders. The left ventricular internal cavity size was normal in size. There is no left ventricular hypertrophy. Left  ventricular diastolic parameters are indeterminate. Right Ventricle: The right ventricular size is normal. No increase in right ventricular wall thickness. Right ventricular systolic function is normal. Left Atrium: Left atrial size was normal in size. Right Atrium: Right atrial size was normal in size. Pericardium: There is no evidence of pericardial effusion. Mitral Valve: The mitral valve is normal in structure. Mild mitral annular calcification. No evidence of mitral valve regurgitation. No evidence of mitral valve stenosis. Tricuspid Valve: The tricuspid valve is normal in structure. Tricuspid valve regurgitation is not demonstrated. No evidence of tricuspid stenosis. Aortic Valve: The aortic valve is normal in structure. There is mild calcification of the aortic valve. Aortic valve regurgitation is not visualized. Aortic valve sclerosis is present, with no evidence of aortic valve stenosis. Pulmonic Valve: The pulmonic valve was normal in structure. Pulmonic valve regurgitation is not  visualized. No evidence of pulmonic stenosis. Aorta: The aortic root is normal in size and structure. Venous: The inferior vena cava is normal in size with greater than 50% respiratory variability, suggesting right atrial pressure of 3 mmHg. IAS/Shunts: No atrial level shunt detected by color flow Doppler.  LEFT VENTRICLE PLAX 2D LVIDd:         4.70 cm     Diastology LVIDs:         3.00 cm     LV e' medial:    7.30 cm/s LV PW:         0.90 cm     LV E/e' medial:  17.9 LV IVS:        0.90 cm     LV e' lateral:   9.64 cm/s LVOT diam:     1.90 cm     LV E/e' lateral: 13.6 LV SV:         64 LV SV Index:   33 LVOT Area:     2.84 cm  LV Volumes (MOD) LV vol d, MOD A2C: 67.1 ml LV vol d, MOD A4C: 74.7 ml LV vol s, MOD A2C: 27.3 ml LV vol s, MOD A4C: 23.8 ml LV SV MOD A2C:     39.8 ml LV SV MOD A4C:     74.7 ml LV SV MOD BP:      49.0 ml RIGHT VENTRICLE RV S prime:     13.40 cm/s TAPSE (M-mode): 1.3 cm LEFT ATRIUM             Index         RIGHT ATRIUM          Index LA diam:        3.20 cm 1.67 cm/m   RA Area:     9.32 cm LA Vol (A2C):   21.9 ml 11.43 ml/m  RA Volume:   18.80 ml 9.81 ml/m LA Vol (A4C):   51.7 ml 26.99 ml/m LA Biplane Vol: 36.4 ml 19.00 ml/m  AORTIC VALVE             PULMONIC VALVE LVOT Vmax:   110.00 cm/s PV Vmax:       1.15 m/s LVOT Vmean:  74.200 cm/s PV Peak grad:  5.3 mmHg LVOT VTI:    0.224 m  AORTA Ao Root diam: 3.70 cm MITRAL VALVE MV Area (PHT): 4.15 cm     SHUNTS MV Decel Time: 183 msec     Systemic VTI:  0.22 m MV E velocity: 131.00 cm/s  Systemic Diam: 1.90 cm MV A velocity: 132.00 cm/s MV E/A ratio:  0.99 Mihai Croitoru MD Electronically signed by Thurmon Fair MD Signature Date/Time: 12/21/2021/10:51:39 AM    Final    DG CHEST PORT 1 VIEW  Result Date: 12/21/2021 CLINICAL DATA:  Metal screening for MRI. EXAM: PORTABLE CHEST 1 VIEW COMPARISON:  Chest CT 12/23/2020. FINDINGS: The lungs are clear without focal pneumonia, edema, pneumothorax or pleural effusion. The cardiopericardial silhouette is within normal limits for size. Radiopaque foreign body projects over the heart and may be metallic. Telemetry leads overlie the chest. IMPRESSION: Radiopaque foreign body projects over the heart, not present on prior CT from 12/23/2020. This may be metallic. Location of this foreign body cannot be determined on a single frontal projection. Consider chest CT without contrast to further evaluate as clinically warranted. Electronically Signed   By: Kennith Center M.D.   On: 12/21/2021 09:24   CT Head Wo Contrast  Result Date:  12/19/2021 CLINICAL DATA:  Dizziness for weeks.  Tremor. EXAM: CT HEAD WITHOUT CONTRAST TECHNIQUE: Contiguous axial images were obtained from the base of the skull through the vertex without intravenous contrast. RADIATION DOSE REDUCTION: This exam was performed according to the departmental dose-optimization program which includes automated exposure control, adjustment of the mA and/or kV  according to patient size and/or use of iterative reconstruction technique. COMPARISON:  CT head 06/05/2021 FINDINGS: Brain: No evidence of acute infarction, hemorrhage, hydrocephalus, extra-axial collection or mass lesion/mass effect. Generalized parenchymal volume loss, similar to prior exam. Scattered hypodensities in the periventricular and subcortical white matter, consistent with chronic small-vessel ischemic changes. Chronic left cerebellar infarct. Vascular: No hyperdense vessel or unexpected calcification. Skull: Normal. Negative for fracture or focal lesion. Sinuses/Orbits: No acute finding. The visualized paranasal sinuses and mastoid air cells are clear. Other: None. IMPRESSION: 1. No acute intracranial abnormality. 2. Generalized parenchymal volume loss and chronic small-vessel ischemic changes, similar to most recent prior exam. Chronic left cerebellar infarct. Electronically Signed   By: Sherron Ales M.D.   On: 12/19/2021 12:33   CT Renal Stone Study  Result Date: 12/19/2021 CLINICAL DATA:  Flank pain.  Kidney stone. EXAM: CT ABDOMEN AND PELVIS WITHOUT CONTRAST TECHNIQUE: Multidetector CT imaging of the abdomen and pelvis was performed following the standard protocol without IV contrast. RADIATION DOSE REDUCTION: This exam was performed according to the departmental dose-optimization program which includes automated exposure control, adjustment of the mA and/or kV according to patient size and/or use of iterative reconstruction technique. COMPARISON:  None Available. FINDINGS: Lower chest: No acute abnormality. Hepatobiliary: No focal liver abnormality is seen. No gallstones, gallbladder wall thickening, or biliary dilatation. Pancreas: Equivocal peripancreatic haziness identified. No definite pancreatic edema. No signs of main duct dilatation or mass. Spleen: Normal in size without focal abnormality. Adrenals/Urinary Tract: Normal adrenal glands. Punctate stone within upper pole of right kidney  noted, image 38/4. No left renal calculi. No kidney mass or hydronephrosis identified. No hydroureter identified. Urinary bladder is unremarkable. Stomach/Bowel: Small hiatal hernia. The appendix is visualized and appears normal. There is no bowel wall thickening, inflammation, or distension. Vascular/Lymphatic: No significant vascular findings are present. No enlarged abdominal or pelvic lymph nodes. Reproductive: Status post hysterectomy. No adnexal masses. Other: No free fluid or fluid collections. No signs of pneumoperitoneum. Musculoskeletal: No acute abnormality. Thoracolumbar spondylosis noted. IMPRESSION: 1. Punctate nonobstructing upper pole right renal calculus. No signs of hydronephrosis, hydroureter or ureteral calculi. 2. Equivocal peripancreatic haziness identified. Correlate for any clinical signs or symptoms of acute pancreatitis. 3. Small hiatal hernia. Electronically Signed   By: Signa Kell M.D.   On: 12/19/2021 09:20   DG Hip Unilat W or Wo Pelvis 2-3 Views Right  Result Date: 12/19/2021 CLINICAL DATA:  Right hip pain for awhile EXAM: DG HIP (WITH OR WITHOUT PELVIS) 2-3V RIGHT COMPARISON:  None Available. FINDINGS: No evidence of fracture or bone lesion. Mild degenerative spurring symmetrically the hips. Osteopenia and lower lumbar spine degeneration. IMPRESSION: 1. No acute or focal finding. 2. Symmetric, mild hip degeneration. Electronically Signed   By: Tiburcio Pea M.D.   On: 12/19/2021 08:00     Subjective: No acute issues or events overnight denies nausea vomiting diarrhea constipation any fevers chills chest pain   Discharge Exam: Vitals:   12/27/21 0337 12/27/21 0744  BP: 122/71 118/70  Pulse: 65 72  Resp: 17 20  Temp: 97.7 F (36.5 C) 98.3 F (36.8 C)  SpO2: 95% 97%   Vitals:  12/26/21 1955 12/26/21 2340 12/27/21 0337 12/27/21 0744  BP: 127/75 114/62 122/71 118/70  Pulse: 76 66 65 72  Resp: 17 17 17 20   Temp: 98.2 F (36.8 C) 98.2 F (36.8 C) 97.7 F  (36.5 C) 98.3 F (36.8 C)  TempSrc: Oral Oral Oral Oral  SpO2: 96% 91% 95% 97%  Weight:      Height:        General: Pt is alert, awake, not in acute distress Cardiovascular: RRR, S1/S2 +, no rubs, no gallops Respiratory: CTA bilaterally, no wheezing, no rhonchi Abdominal: Soft, NT, ND, bowel sounds + Extremities: no edema, no cyanosis    The results of significant diagnostics from this hospitalization (including imaging, microbiology, ancillary and laboratory) are listed below for reference.     Microbiology: No results found for this or any previous visit (from the past 240 hour(s)).   Labs: BNP (last 3 results) No results for input(s): "BNP" in the last 8760 hours. Basic Metabolic Panel: Recent Labs  Lab 12/21/21 0432  NA 140  K 3.2*  CL 111  CO2 19*  GLUCOSE 82  BUN 8  CREATININE 0.69  CALCIUM 8.4*   Liver Function Tests: No results for input(s): "AST", "ALT", "ALKPHOS", "BILITOT", "PROT", "ALBUMIN" in the last 168 hours. Recent Labs  Lab 12/20/21 1543  LIPASE 26   No results for input(s): "AMMONIA" in the last 168 hours. CBC: Recent Labs  Lab 12/21/21 0432  WBC 4.8  HGB 11.8*  HCT 33.9*  MCV 93.6  PLT 159   Cardiac Enzymes: No results for input(s): "CKTOTAL", "CKMB", "CKMBINDEX", "TROPONINI" in the last 168 hours. BNP: Invalid input(s): "POCBNP" CBG: No results for input(s): "GLUCAP" in the last 168 hours. D-Dimer No results for input(s): "DDIMER" in the last 72 hours. Hgb A1c No results for input(s): "HGBA1C" in the last 72 hours. Lipid Profile No results for input(s): "CHOL", "HDL", "LDLCALC", "TRIG", "CHOLHDL", "LDLDIRECT" in the last 72 hours. Thyroid function studies No results for input(s): "TSH", "T4TOTAL", "T3FREE", "THYROIDAB" in the last 72 hours.  Invalid input(s): "FREET3" Anemia work up No results for input(s): "VITAMINB12", "FOLATE", "FERRITIN", "TIBC", "IRON", "RETICCTPCT" in the last 72 hours. Urinalysis    Component  Value Date/Time   COLORURINE YELLOW 12/19/2021 0805   APPEARANCEUR HAZY (A) 12/19/2021 0805   LABSPEC >=1.030 12/19/2021 0805   PHURINE 5.5 12/19/2021 0805   GLUCOSEU NEGATIVE 12/19/2021 0805   HGBUR NEGATIVE 12/19/2021 0805   BILIRUBINUR SMALL (A) 12/19/2021 0805   KETONESUR NEGATIVE 12/19/2021 0805   PROTEINUR NEGATIVE 12/19/2021 0805   NITRITE NEGATIVE 12/19/2021 0805   LEUKOCYTESUR NEGATIVE 12/19/2021 0805   Sepsis Labs Recent Labs  Lab 12/21/21 0432  WBC 4.8   Microbiology No results found for this or any previous visit (from the past 240 hour(s)).   Time coordinating discharge: Over 30 minutes  SIGNED:   02/20/22, DO [facility] 12/27/2021, 10:06 AM Pager   If 7PM-7AM, please contact night-coverage [email or web address]
